# Patient Record
Sex: Male | Born: 1955
Health system: Southern US, Community
[De-identification: ages and names within clinical notes are randomized; demographics above are authoritative.]

## PROBLEM LIST (undated history)

## (undated) DIAGNOSIS — G4730 Sleep apnea, unspecified: Secondary | ICD-10-CM

## (undated) DIAGNOSIS — A6920 Lyme disease, unspecified: Secondary | ICD-10-CM

## (undated) DIAGNOSIS — I219 Acute myocardial infarction, unspecified: Secondary | ICD-10-CM

## (undated) DIAGNOSIS — I10 Essential (primary) hypertension: Secondary | ICD-10-CM

## (undated) DIAGNOSIS — N189 Chronic kidney disease, unspecified: Secondary | ICD-10-CM

## (undated) HISTORY — PX: ABLATION: SHX5711

---

## 2018-05-25 DIAGNOSIS — R059 Cough, unspecified: Secondary | ICD-10-CM | POA: Insufficient documentation

## 2018-08-12 DIAGNOSIS — D649 Anemia, unspecified: Secondary | ICD-10-CM | POA: Insufficient documentation

## 2019-03-18 DIAGNOSIS — E669 Obesity, unspecified: Secondary | ICD-10-CM | POA: Insufficient documentation

## 2019-05-23 DIAGNOSIS — C61 Malignant neoplasm of prostate: Secondary | ICD-10-CM | POA: Insufficient documentation

## 2019-05-25 DIAGNOSIS — N393 Stress incontinence (female) (male): Secondary | ICD-10-CM | POA: Insufficient documentation

## 2019-09-28 ENCOUNTER — Encounter (HOSPITAL_COMMUNITY): Payer: Self-pay | Admitting: Emergency Medicine

## 2019-09-28 ENCOUNTER — Other Ambulatory Visit: Payer: Self-pay

## 2019-09-28 ENCOUNTER — Inpatient Hospital Stay (HOSPITAL_COMMUNITY)
Admission: AD | Admit: 2019-09-28 | Discharge: 2019-10-01 | DRG: 286 | Disposition: A | Discharge status: Home / Self Care | Payer: Medicare HMO | Attending: Family Medicine | Admitting: Family Medicine

## 2019-09-28 ENCOUNTER — Emergency Department (HOSPITAL_COMMUNITY): Payer: Medicare HMO

## 2019-09-28 DIAGNOSIS — I428 Other cardiomyopathies: Secondary | ICD-10-CM | POA: Diagnosis present

## 2019-09-28 DIAGNOSIS — Z8546 Personal history of malignant neoplasm of prostate: Secondary | ICD-10-CM

## 2019-09-28 DIAGNOSIS — R0789 Other chest pain: Secondary | ICD-10-CM

## 2019-09-28 DIAGNOSIS — G4733 Obstructive sleep apnea (adult) (pediatric): Secondary | ICD-10-CM | POA: Diagnosis present

## 2019-09-28 DIAGNOSIS — Z8619 Personal history of other infectious and parasitic diseases: Secondary | ICD-10-CM

## 2019-09-28 DIAGNOSIS — I4892 Unspecified atrial flutter: Secondary | ICD-10-CM | POA: Diagnosis not present

## 2019-09-28 DIAGNOSIS — I483 Typical atrial flutter: Secondary | ICD-10-CM

## 2019-09-28 DIAGNOSIS — I77810 Thoracic aortic ectasia: Secondary | ICD-10-CM | POA: Diagnosis present

## 2019-09-28 DIAGNOSIS — R7303 Prediabetes: Secondary | ICD-10-CM | POA: Diagnosis present

## 2019-09-28 DIAGNOSIS — Z20822 Contact with and (suspected) exposure to covid-19: Secondary | ICD-10-CM | POA: Diagnosis present

## 2019-09-28 DIAGNOSIS — I440 Atrioventricular block, first degree: Secondary | ICD-10-CM | POA: Diagnosis present

## 2019-09-28 DIAGNOSIS — E876 Hypokalemia: Secondary | ICD-10-CM | POA: Diagnosis present

## 2019-09-28 DIAGNOSIS — I4891 Unspecified atrial fibrillation: Secondary | ICD-10-CM | POA: Diagnosis present

## 2019-09-28 DIAGNOSIS — I252 Old myocardial infarction: Secondary | ICD-10-CM

## 2019-09-28 DIAGNOSIS — I130 Hypertensive heart and chronic kidney disease with heart failure and stage 1 through stage 4 chronic kidney disease, or unspecified chronic kidney disease: Secondary | ICD-10-CM | POA: Diagnosis present

## 2019-09-28 DIAGNOSIS — I5023 Acute on chronic systolic (congestive) heart failure: Secondary | ICD-10-CM

## 2019-09-28 DIAGNOSIS — I5043 Acute on chronic combined systolic (congestive) and diastolic (congestive) heart failure: Secondary | ICD-10-CM | POA: Diagnosis present

## 2019-09-28 DIAGNOSIS — N1831 Chronic kidney disease, stage 3a: Secondary | ICD-10-CM | POA: Diagnosis present

## 2019-09-28 DIAGNOSIS — I2510 Atherosclerotic heart disease of native coronary artery without angina pectoris: Secondary | ICD-10-CM | POA: Diagnosis present

## 2019-09-28 DIAGNOSIS — E785 Hyperlipidemia, unspecified: Secondary | ICD-10-CM | POA: Diagnosis present

## 2019-09-28 DIAGNOSIS — I739 Peripheral vascular disease, unspecified: Secondary | ICD-10-CM | POA: Diagnosis present

## 2019-09-28 DIAGNOSIS — I272 Pulmonary hypertension, unspecified: Secondary | ICD-10-CM | POA: Diagnosis present

## 2019-09-28 DIAGNOSIS — I509 Heart failure, unspecified: Secondary | ICD-10-CM

## 2019-09-28 DIAGNOSIS — Z79899 Other long term (current) drug therapy: Secondary | ICD-10-CM

## 2019-09-28 DIAGNOSIS — I493 Ventricular premature depolarization: Secondary | ICD-10-CM | POA: Diagnosis present

## 2019-09-28 DIAGNOSIS — E669 Obesity, unspecified: Secondary | ICD-10-CM | POA: Diagnosis present

## 2019-09-28 DIAGNOSIS — Z6838 Body mass index (BMI) 38.0-38.9, adult: Secondary | ICD-10-CM

## 2019-09-28 DIAGNOSIS — Z87891 Personal history of nicotine dependence: Secondary | ICD-10-CM

## 2019-09-28 HISTORY — DX: Sleep apnea, unspecified: G47.30

## 2019-09-28 HISTORY — DX: Acute myocardial infarction, unspecified: I21.9

## 2019-09-28 HISTORY — DX: Essential (primary) hypertension: I10

## 2019-09-28 HISTORY — DX: Lyme disease, unspecified: A69.20

## 2019-09-28 LAB — CBC
HCT: 37.3 % — ABNORMAL LOW (ref 39.0–52.0)
Hemoglobin: 12.9 g/dL — ABNORMAL LOW (ref 13.0–17.0)
MCH: 29.1 pg (ref 26.0–34.0)
MCHC: 34.6 g/dL (ref 30.0–36.0)
MCV: 84.2 fL (ref 80.0–100.0)
Platelets: 319 10*3/uL (ref 150–400)
RBC: 4.43 MIL/uL (ref 4.22–5.81)
RDW: 14.4 % (ref 11.5–15.5)
WBC: 11.8 10*3/uL — ABNORMAL HIGH (ref 4.0–10.5)
nRBC: 0 % (ref 0.0–0.2)

## 2019-09-28 LAB — BASIC METABOLIC PANEL
Anion gap: 9 (ref 5–15)
BUN: 20 mg/dL (ref 8–23)
CO2: 26 mmol/L (ref 22–32)
Calcium: 9.1 mg/dL (ref 8.9–10.3)
Chloride: 106 mmol/L (ref 98–111)
Creatinine, Ser: 1.58 mg/dL — ABNORMAL HIGH (ref 0.61–1.24)
GFR calc Af Amer: 52 mL/min — ABNORMAL LOW (ref >60)
GFR calc non Af Amer: 45 mL/min — ABNORMAL LOW (ref >60)
Glucose, Bld: 114 mg/dL — ABNORMAL HIGH (ref 70–99)
Potassium: 3.3 mmol/L — ABNORMAL LOW (ref 3.5–5.1)
Sodium: 141 mmol/L (ref 135–145)

## 2019-09-28 LAB — BRAIN NATRIURETIC PEPTIDE: B Natriuretic Peptide: 343.3 pg/mL — ABNORMAL HIGH (ref 0.0–100.0)

## 2019-09-28 LAB — D-DIMER, QUANTITATIVE: D-Dimer, Quant: 0.61 ug/mL-FEU — ABNORMAL HIGH (ref 0.00–0.50)

## 2019-09-28 LAB — SARS CORONAVIRUS 2 BY RT PCR (HOSPITAL ORDER, PERFORMED IN ~~LOC~~ HOSPITAL LAB): SARS Coronavirus 2: NEGATIVE

## 2019-09-28 LAB — TROPONIN I (HIGH SENSITIVITY)
Troponin I (High Sensitivity): 40 ng/L — ABNORMAL HIGH (ref <18)
Troponin I (High Sensitivity): 32 ng/L — ABNORMAL HIGH (ref <18)

## 2019-09-28 MED ORDER — DILTIAZEM HCL 100 MG IV SOLR
5.0000 mg/h | INTRAVENOUS | Status: DC
  Filled 2019-09-28: qty 100

## 2019-09-28 MED ORDER — METOPROLOL TARTRATE 50 MG PO TABS
50.0000 mg | ORAL_TABLET | Freq: Every day | ORAL | Status: DC
  Administered 2019-09-28: 50 mg via ORAL
  Filled 2019-09-28: qty 2

## 2019-09-28 MED ORDER — DILTIAZEM HCL-DEXTROSE 125-5 MG/125ML-% IV SOLN (PREMIX)
5.0000 mg/h | INTRAVENOUS | Status: DC
  Administered 2019-09-28: 5 mg/h via INTRAVENOUS
  Filled 2019-09-28: qty 125

## 2019-09-28 MED ORDER — FUROSEMIDE 10 MG/ML IJ SOLN
20.0000 mg | Freq: Once | INTRAMUSCULAR | Status: AC
  Administered 2019-09-28: 20 mg via INTRAVENOUS
  Filled 2019-09-28: qty 2

## 2019-09-28 MED ORDER — HEPARIN BOLUS VIA INFUSION
5900.0000 [IU] | Freq: Once | INTRAVENOUS | Status: AC
  Administered 2019-09-28: 5900 [IU] via INTRAVENOUS
  Filled 2019-09-28: qty 5900

## 2019-09-28 MED ORDER — ONDANSETRON HCL 4 MG/2ML IJ SOLN: 4.0000 mg | Freq: Four times a day (QID) | INTRAMUSCULAR | Status: DC | PRN

## 2019-09-28 MED ORDER — SODIUM CHLORIDE 0.9% FLUSH: 3.0000 mL | Freq: Once | INTRAVENOUS | Status: DC

## 2019-09-28 MED ORDER — IRBESARTAN 150 MG PO TABS
300.0000 mg | ORAL_TABLET | Freq: Every day | ORAL | Status: DC
  Administered 2019-09-29 – 2019-10-01 (×3): 300 mg via ORAL
  Filled 2019-09-28 (×3): qty 2

## 2019-09-28 MED ORDER — HEPARIN (PORCINE) 25000 UT/250ML-% IV SOLN
1500.0000 [IU]/h | INTRAVENOUS | Status: DC
  Administered 2019-09-28: 1500 [IU]/h via INTRAVENOUS
  Administered 2019-09-29: 1700 [IU]/h via INTRAVENOUS
  Administered 2019-09-29: 1650 [IU]/h via INTRAVENOUS
  Filled 2019-09-28 (×3): qty 250

## 2019-09-28 MED ORDER — POTASSIUM CHLORIDE CRYS ER 20 MEQ PO TBCR
40.0000 meq | EXTENDED_RELEASE_TABLET | Freq: Once | ORAL | Status: AC
  Administered 2019-09-29: 40 meq via ORAL
  Filled 2019-09-28: qty 2

## 2019-09-28 MED ORDER — ATORVASTATIN CALCIUM 40 MG PO TABS
40.0000 mg | ORAL_TABLET | Freq: Every day | ORAL | Status: DC
  Administered 2019-09-29: 40 mg via ORAL
  Filled 2019-09-28: qty 1

## 2019-09-28 MED ORDER — ACETAMINOPHEN 325 MG PO TABS: 650.0000 mg | ORAL_TABLET | ORAL | Status: DC | PRN

## 2019-09-28 NOTE — ED Triage Notes (Signed)
Pt. Stated, Im having SOB and Chest pain or I should say chest tightness.Logan Robertson had several heart attacks.

## 2019-09-28 NOTE — ED Notes (Signed)
IV access attempted X2 without success. 

## 2019-09-28 NOTE — ED Provider Notes (Signed)
[facility] Provider Note   CSN: [id number] Arrival date & time: 09/28/19  1012     History Chief Complaint  Patient presents with  . Shortness of Breath  . Chest Pain    Logan Robertson is a 64 y.o. male.  HPI     Patient presents concern of dyspnea, fatigue.  Patient also has chest pain, tightness.  Patient has multiple medical issues, which he acknowledges.  He has previously received medical care in [place], has no team of physicians here. He has a history of cardiomyopathy, MI.  It is unclear if he has been taking his medication regularly. Now, over the past 2 days he has developed chest pain, dyspnea.  Pain described as tightness, not clearly better or worse with anything including exertion, positioning. No fever, no cough.  History reviewed. No pertinent past medical history.  There are no problems to display for this patient.   History reviewed. No pertinent surgical history.     No family history on file.  Social History   Tobacco Use  . Smoking status: Former Research scientist (life sciences)  . Smokeless tobacco: Never Used  Substance Use Topics  . Alcohol use: Not Currently  . Drug use: Not Currently    Home Medications Prior to Admission medications   Medication Sig Start Date End Date Taking? Authorizing Provider  acetaminophen (TYLENOL) 325 MG tablet Take 650 mg by mouth every 6 (six) hours as needed for moderate pain.   Yes [provider]  amLODipine (NORVASC) 10 MG tablet Take 10 mg by mouth 2 (two) times daily. 07/21/19 07/20/20 Yes [provider]  atorvastatin (LIPITOR) 40 MG tablet Take 40 mg by mouth daily. 08/26/18  Yes [provider]  chlorthalidone (HYGROTON) 25 MG tablet Take 25 mg by mouth 2 (two) times daily. 08/26/18  Yes [provider]  ibuprofen (ADVIL) 200 MG tablet Take 600 mg by mouth every 6 (six) hours as needed for moderate pain.   Yes [provider]  metoprolol  tartrate (LOPRESSOR) 50 MG tablet Take 50 mg by mouth daily. 08/26/18  Yes [provider]  valsartan (DIOVAN) 320 MG tablet Take 320 mg by mouth daily. 05/14/19  Yes [provider]    Allergies    Lisinopril  Review of Systems   Review of Systems  Constitutional:       Per HPI, otherwise negative  HENT:       Per HPI, otherwise negative  Respiratory:       Per HPI, otherwise negative  Cardiovascular:       Per HPI, otherwise negative  Gastrointestinal: Negative for vomiting.  Endocrine:       Negative aside from HPI  Genitourinary:       Neg aside from HPI   Musculoskeletal:       Per HPI, otherwise negative  Skin: Negative.   Neurological: Negative for syncope.    Physical Exam Updated Vital Signs BP (!) 143/98   Pulse 67   Temp 97.8 F (36.6 C) (Oral)   Resp 16   Ht 6' (1.829 m)   Wt 127 kg   SpO2 99%   BMI 37.97 kg/m   Physical Exam Vitals and nursing note reviewed.  Constitutional:      General: He is not in acute distress.    Appearance: He is obese.  HENT:     Head: Normocephalic and atraumatic.  Eyes:     Conjunctiva/sclera: Conjunctivae normal.  Cardiovascular:  Rate and Rhythm: Normal rate and regular rhythm.  Pulmonary:     Effort: Pulmonary effort is normal. No respiratory distress.     Breath sounds: No stridor. Decreased breath sounds present.  Abdominal:     General: There is no distension.  Skin:    General: Skin is warm and dry.  Neurological:     Mental Status: He is alert and oriented to person, place, and time.      ED Results / Procedures / Treatments   Labs (all labs ordered are listed, but only abnormal results are displayed) Labs Reviewed  BASIC METABOLIC PANEL - Abnormal; Notable for the following components:      Result Value   Potassium 3.3 (*)    Glucose, Bld 114 (*)    Creatinine, Ser 1.58 (*)    GFR calc non Af Amer 45 (*)    GFR calc Af Amer 52 (*)    All other components within normal  limits  CBC - Abnormal; Notable for the following components:   WBC 11.8 (*)    Hemoglobin 12.9 (*)    HCT 37.3 (*)    All other components within normal limits  BRAIN NATRIURETIC PEPTIDE - Abnormal; Notable for the following components:   B Natriuretic Peptide 343.3 (*)    All other components within normal limits  TROPONIN I (HIGH SENSITIVITY) - Abnormal; Notable for the following components:   Troponin I (High Sensitivity) 40 (*)    All other components within normal limits  TROPONIN I (HIGH SENSITIVITY) - Abnormal; Notable for the following components:   Troponin I (High Sensitivity) 32 (*)    All other components within normal limits  D-DIMER, QUANTITATIVE (NOT AT [facility])    EKG EKG Interpretation  Date/Time:  Tuesday September 28 2019 10:12:36 EDT Ventricular Rate:  83 PR Interval:    QRS Duration: 92 QT Interval:  432 QTC Calculation: 507 R Axis:   -3 Text Interpretation: THIS IS AN AMENDED REPORT, PLEASE DISCARD PREVIOUS RECORD Sinus rhythm w periods sof either ectopy or afib Premature ventricular complexes Minimal voltage criteria for LVH, may be normal variant ( R in aVL ) ST-t wave abnormality Artifact Abnormal ECG Reconfirmed by Carmin Muskrat [phone]) on 09/28/2019 1:05:53 PM   Radiology DG Chest 2 View  Result Date: 09/28/2019 CLINICAL DATA:  Chest pain, shortness of breath EXAM: CHEST - 2 VIEW COMPARISON:  None. FINDINGS: Heart size is mildly enlarged. Mildly increased bibasilar interstitial markings. No pleural effusion or pneumothorax. No acute osseous findings. IMPRESSION: 1. Mildly increased bibasilar interstitial markings, which may reflect mild edema versus atypical/viral infection. 2. Mild cardiomegaly. Electronically Signed   By: Davina Poke D.O.   On: 09/28/2019 10:46    Procedures Procedures (including critical care time)  Medications Ordered in ED Medications  sodium chloride flush (NS) 0.9 % injection 3 mL (has no administration in time range)     ED Course  I have reviewed the triage vital signs and the nursing notes.  Pertinent labs & imaging results that were available during my care of the patient were reviewed by me and considered in my medical decision making (see chart for details).     Patient with multiple medical issues including cardiomyopathy presents with chest pain, dyspnea.  Here he is awake, alert, without oxygen requirement, but with tachypnea, concerning for uncontrolled hypertension versus worsening heart failure versus PE versus ACS.  Labs, x-ray, EKG all ordered after initial evaluation.  3:38 PM Patient has been seen and evaluated  by our home health team to facilitate ongoing care after discharge.  Initial findings notable for elevated BNP, greater than 300, x-ray suggesting pulmonary edema.  Patient will receive IV Lasix.  Patient's troponin results otherwise reassuring, EKG is not overtly ischemic.  Patient has D-dimer pending, given his history, should this be normal, patient will be appropriate for discharge with close outpatient follow-up with possible at home program.  MDM Number of Diagnoses or Management Options Acute on chronic congestive heart failure, unspecified heart failure type [facility]): new, needed workup Atypical chest pain: new, needed workup   Amount and/or Complexity of Data Reviewed Clinical lab tests: reviewed Tests in the radiology section of CPT: reviewed Tests in the medicine section of CPT: reviewed Decide to obtain previous medical records or to obtain history from someone other than the patient: yes Review and summarize past medical records: yes Discuss the patient with other providers: yes Independent visualization of images, tracings, or specimens: yes  Risk of Complications, Morbidity, and/or Mortality Presenting problems: high Diagnostic procedures: high Management options: high  Critical Care Total time providing critical care: < 30 minutes  Patient  Progress Patient progress: stable   Final Clinical Impression(s) / ED Diagnoses Final diagnoses:  Acute on chronic congestive heart failure, unspecified heart failure type ([place])  Atypical chest pain     Carmin Muskrat, MD 09/28/19 1540

## 2019-09-28 NOTE — H&P (Addendum)
[facility] Admission History and Physical Service Pager: [phone]  Patient name: Logan Robertson Medical record number: [id number] Date of birth: Aug 02, 1955 Age: 64 y.o. Gender: male  Primary Care Provider: No primary care provider on file. Consultants: Cards Code Status: Full Code Preferred Emergency Contact: Lubertha Basque (friend) [phone]  Chief Complaint: Shortness of breath, new onset arrhythmia  Assessment and Plan:  Logan Robertson is a 64 y.o. male presenting with dyspnea, fatigue, chest tightness, found to have new onset arrhythmia.  Additionally, patient is new to the area, recently moved here from [place]. PMH is significant for cardiomyopathy, adenocarcinoma of prostate, hypertension, hyperlipidemia, Lyme disease, MI (2008?), angioedema due to ACE-I, BPH, CKD, elevated PSA, CVA (2010), thyrotoxicosis?, OSA.  Chest pain/tightness likely 2/2 new onset A-flutter +/- mild CHF exacerbation  Patient presenting to the emergency department today with 2-day history of chest tightness and SOB. Patient reported that tightness initially started while lifting heavy objects but resolved with rest.  He reported that it would help if he took slow deep breaths.  Patient also reported intermittent pain in his right calf that felt like a cramp but resolved with rest.  Denies any tenderness to palpation.  He was concerned today because it was happening every 30 minutes while at rest and would last for 2 to 3 minutes in occurrence.  Patient has a history of possible heart attack (2008) although no history of catheterization or stenting and normal stress test in 2018. Reassuring that troponins on admission 40, downtrending to 32. EKG showed new atrial flutter with predominant 4-1 AV block.  Chest x-ray showed mildly increased bibasilar interstitial markings, may reflect mild edema versus atypical viral infection. Less likely infection given lack of infectious symptoms.  Some  concern for mild CHF exacerbation given mild cardiomegaly on CXR, BNP elevated at 343. Most recent echo in 2018 with resolved non-ischemic cardiomyopathy with normal EF. D-dimer on admission mildly elevated at 0.61, however symmetric LE's and Well's score 1.5 due to tachycardia. At this time, differential for symptoms includes intermittent atrial flutter and mild CHF exacerbation. Less likely ACS given flat high-sensitivity troponins and EKG negative for STEMI.  This chest pain/tightness is most likely related to patient's new onset atrial flutter.  -Admit to inpatient with Dr. Gwendlyn Deutscher is the attending -Cardiology consulted, appreciate recommendations -Continue heparin infusion -Continuous cardiac monitoring -Echocardiogram ordered -Continue heparin per pharmacy -Continue diltiazem -PT/INR, APTT -TSH, Lipid panel, Hemoglobin A1c -Morning BMP and CBC -Strict I's and O's -Vital signs per routine -PT/OT eval and treat -Continuous pulse ox -Oxygen therapy keeping goal over 92% -Lower extremity ABIs  New onset atrial flutter:  Patient started on Cardizem in the emergency department.  Currently prescribed metoprolol 50 mg daily outpatient.  EKG on arrival showed atrial flutter with predominant 4-1 AV block.  On evaluation patient's heart rate was between 80 and 130.  He was intermittently going between atrial flutter and normal sinus rhythm. -consult cardiology in AM -Continue diltiazem drip, transition to PO when able -Holding home metoprolol at this time -See above for rest of plan  LE Claudication: Patient admitted to intermittent claudication in RLE that would occur with exertion and resolve with rest. Described pain as "cramp".  - obtain ABI's to evaluate  Hypertension:  Blood pressure since arrival 143/98-26/119.  On evaluation blood pressure was elevated in the 160s over 90s..  Patient currently prescribed amlodipine 10, valsartan 320 mg daily, chlorthalidone, and metoprolol 50 mg  daily. -Irbesartan 300 mg daily (valsartan is  not formulary here) -Continue Cardizem drip -Holding amlodipine, metoprolol, chlorthalidone  Hypokalemia  Patient noted to have hypokalemia at 3.3 on admission. -40 mEq Klor-Con given -Morning BMPs -Replete as necessary  Kidney disease 3a: Patient with creatinine 1.58 on admission, GFR is 45 on admission.  It is unknown if patient has previous kidney disease.  7 months ago patient's GFR was 63. -Morning BMPs -Avoid nephrotoxic agents  Hyperlipidemia:  Patient currently prescribed atorvastatin 40 mg. -Continue home atorvastatin  H/o Non-ischemic Cardiomyopathy  History of CAD?  History of CVA?:  Per cahrt review patient has history of "two strokes and one heart attack". Per patient, MI was in 2008 . Per cardiology notes from [place], he has h/o non-ischemic cardiomyopathy 2/2 to uncontrolled hypertension that appears to have since resolved.  He has NST in 2013? That showed EF of 30% with inferior scare; Echo with EF 40-45% with inferior hypokinesis. In 2018, he had repeat testing that revealed normalization of his EF. NST at that time showed no ischemia with an EF of 58%. Does not appear to have any history of cardiac catheterization or stenting. Home meds: ASA 81mg  QD, Atorvastatin, Valsartan. -Continue atorvastatin and start -Cardiology consulted, appreciate recommendations -Holding home aspirin at this time -Repeat echo ordered  History of Lymes Disease: Per chart review, patient was diagnosed with Lymes Disease in 2020 with positive IgG and IgM labs. He is s/p 4 month course of Doxycycline. Joint pains have improved. Per rheumatology note from 11/26/2018, it is not recommended to treat with repeat rounds of antibiotics for arthralgias related to Lymes.  treat with Doxycycline. Patient denies any current arthralgias. - PRN anti-inflammatory medications for joint pains if needed  History of PSA Discordant Intermediate risk  Prostate Cancer: Stable He was being worked up by urologist in [place], last appointment in May 2021. He has had multiple imaging (CT, Bone scan, MRI) that was negative for metastasis. Appears patient had a non-NS RALP (prostatectomy) in 02/2019 with an undetectable PSA in 04/2019.   OSA:  Sleep study performed in [place]. He was supposed to trial CPAP and follow up.  Reported that the CPAP helped tremendously. Patient notes he wears CPAP nightly. - CPAP qHS  FEN/GI: Heart healthy Prophylaxis: Heparin drip  Disposition: Admit to med telemetry  History of Present Illness:  Logan Robertson is a 64 y.o. male presenting with two day history of chest tightness and dyspnea on exertion. Patient moved from [place] 4 days ago. He notes that he was moving boxes yesterday when he developed chest tightness and SOB. He notes the chest tightness was located in center of chest and felt like "someone grabbing the heart and not letting it beat". He notes he's had this before over the years. He notes the chest pain resolved with rest. Denies any palpations, diaphoresis, jaw pain, or arm pain. Patient notes that he was lying around he would get chest tightness and SOB that would last 2-3 minutes and self-resolve with deep breaths. He notes it only would happen when he was laying down. Changing positions to allow breathing would help.  Denies any LE swelling. He notes he will get calf pain with exertion that improves with rest. It does not occur at rest. Patient denies any orthopnea or PND. He has history of CPAP and was using it regularly when in [place] but "gave it back when he moved because he wasn't sure he could bring it with you". Denies any cough, congestion, fevers, chills, nausea, vomiting,  diarrhea, constipation. Patient notes recently moved from [place] to [place], [place] which is 6 hour drive. No other recent travels. No sick contacts. No recent surgeries.  No alcohol. No tobacco history. No illicit  drugs.  Review Of Systems: Per HPI with the following additions:  Review of Systems  Constitutional: Negative for chills, diaphoresis and fever.  HENT: Positive for postnasal drip. Negative for congestion, rhinorrhea and sore throat.   Eyes: Negative for visual disturbance.  Respiratory: Positive for chest tightness and shortness of breath. Negative for cough and wheezing.   Cardiovascular: Positive for chest pain. Negative for palpitations and leg swelling.  Gastrointestinal: Negative for abdominal pain, constipation, diarrhea, nausea and vomiting.  Endocrine: Negative for polyuria.  Genitourinary: Negative for dysuria and hematuria.       History of known incontinence   Musculoskeletal: Negative for arthralgias and joint swelling.  Neurological: Negative for weakness.     Patient Active Problem List   Diagnosis Date Noted  . Atrial flutter ([place]) 09/28/2019    Past Medical History: History reviewed. No pertinent past medical history. Prostatic adenocarcinoma (02/15/2019)   Past Surgical History: History reviewed. No pertinent surgical history.  Social History: Social History   Tobacco Use  . Smoking status: Former Research scientist (life sciences)  . Smokeless tobacco: Never Used  Substance Use Topics  . Alcohol use: Not Currently  . Drug use: Not Currently   Additional social history: Please also refer to relevant sections of EMR.  Family History: No family history on file.  Allergies and Medications: Allergies  Allergen Reactions  . Lisinopril     Lip swelled up   No current facility-administered medications on file prior to encounter.   Current Outpatient Medications on File Prior to Encounter  Medication Sig Dispense Refill  . acetaminophen (TYLENOL) 325 MG tablet Take 650 mg by mouth every 6 (six) hours as needed for moderate pain.    Marland Kitchen amLODipine (NORVASC) 10 MG tablet Take 10 mg by mouth 2 (two) times daily.    Marland Kitchen atorvastatin (LIPITOR) 40 MG tablet Take 40 mg by mouth daily.     . chlorthalidone (HYGROTON) 25 MG tablet Take 25 mg by mouth 2 (two) times daily.    Marland Kitchen ibuprofen (ADVIL) 200 MG tablet Take 600 mg by mouth every 6 (six) hours as needed for moderate pain.    . metoprolol tartrate (LOPRESSOR) 50 MG tablet Take 50 mg by mouth daily.    . valsartan (DIOVAN) 320 MG tablet Take 320 mg by mouth daily.      Objective: BP (!) 163/89 (BP Location: Right Arm)   Pulse 87   Temp 98.6 F (37 C) (Oral)   Resp (!) 22   Ht 6' (1.829 m)   Wt 126.8 kg   SpO2 99%   BMI 37.91 kg/m  Physical Exam Vitals reviewed.  Constitutional:      Appearance: He is well-developed.  HENT:     Head: Normocephalic and atraumatic.     Mouth/Throat:     Mouth: Mucous membranes are moist.     Pharynx: Oropharynx is clear.  Eyes:     Extraocular Movements: Extraocular movements intact.     Pupils: Pupils are equal, round, and reactive to light.  Cardiovascular:     Rate and Rhythm: Tachycardia present. Rhythm irregular.     Pulses: Normal pulses.     Heart sounds: No murmur heard.   Pulmonary:     Effort: Pulmonary effort is normal. No tachypnea or respiratory distress.  Breath sounds: Normal breath sounds.  Chest:     Chest wall: No mass, tenderness or edema.  Abdominal:     General: Bowel sounds are normal.     Palpations: Abdomen is soft.  Musculoskeletal:     Cervical back: Normal range of motion and neck supple.     Comments: Trace edema in lower extremities bilaterally  Lymphadenopathy:     Cervical: No cervical adenopathy.  Skin:    General: Skin is warm and dry.     Findings: No ecchymosis or rash.  Neurological:     General: No focal deficit present.     Mental Status: He is alert.  Psychiatric:        Mood and Affect: Mood normal.        Behavior: Behavior normal.      Labs and Imaging: CBC BMET  Recent Labs  Lab 09/28/19 1032  WBC 11.8*  HGB 12.9*  HCT 37.3*  PLT 319   Recent Labs  Lab 09/28/19 1032  NA 141  K 3.3*  CL 106  CO2 26   BUN 20  CREATININE 1.58*  GLUCOSE 114*  CALCIUM 9.1     EKG: Atrial flutter with predominant 4-1 AV block  DG Chest 2 View  Result Date: 09/28/2019 CLINICAL DATA:  Chest pain, shortness of breath EXAM: CHEST - 2 VIEW COMPARISON:  None. FINDINGS: Heart size is mildly enlarged. Mildly increased bibasilar interstitial markings. No pleural effusion or pneumothorax. No acute osseous findings. IMPRESSION: 1. Mildly increased bibasilar interstitial markings, which may reflect mild edema versus atypical/viral infection. 2. Mild cardiomegaly. Electronically Signed   By: Davina Poke D.O.   On: 09/28/2019 10:46     Gifford Shave, MD 09/28/2019, 10:57 PM PGY-1, [facility] pager: [phone], text pages welcome  Upper Level Addendum: I have seen and evaluated this patient along with Dr. Caron Presume and reviewed the above note, making necessary revisions in green.   Mina Marble, D.O. 09/29/2019, 7:24 AM PGY-2, [place]

## 2019-09-28 NOTE — Plan of Care (Signed)
[facility]  Consult Note  Evaluated Mr. Deer for possible hospital at home admission.  Pt with PMH of reports worsening dyspnea on exertion and chest tightness.  Cardiac History not completely clear, cannot see notes in care everywhere.  Called pts cardiologist office and according to NP there pt with HFrEF in 2013 and EF improved to normal in 2018 with GDMT.  He is mildly volume overloaded on exam.  No signs of acute ischemia on initial workup here in ED.  I performed a limited ECHO on the patient at which time I noticed he now again has reduced ejection fraction. His IVC was dilated but collapsible, I observed no significant valvular abnormalities but this part of the exam was limited due to body habitus.  When I asked him to move for a better view his heart rate increased dramatically with possible flutter waves on telemetry.  At this point I was concerned for arryhthmia and asked the ED provider to repeat an ECG which confirmed atrial flutter which appears to be a new diagnosis for the patient.  Discussed with the ED provider and the decision was to admit the patient which was appropriate.  Appreciate the consultation and allowing me to be a part of this patient's care.    Katherine Roan, MD 09/28/2019, 3:17 PM

## 2019-09-28 NOTE — ED Provider Notes (Signed)
Patient care assumed at 1600. Patient with history of CHF here for evaluation of increased shortness of breath. He moved to the [place] area four days ago. He does not yet have a local physician. D dimer is pending at time of signout.  Awaiting D dimer results patient did develop activity on the monitor. New EKG demonstrates atrial flutter. His rates are varying between 70 and 133 bpm. Patient has no history of atrial flutter. It is unclear how long he has been in this rhythm. He is not anticoagulated. Plan to start on diltiazem for rate control, heparin drip. Discussed with Dr. Shan Levans with hospital at home program. Given these new findings feel patient is not a good hospital at home candidate, will admit for further evaluation and treatment. Discussed with patient findings of studies and he is in agreement with treatment plan. Medicine consulted for admission.   Quintella Reichert, MD 09/28/19 [phone]

## 2019-09-28 NOTE — Progress Notes (Signed)
ANTICOAGULATION CONSULT NOTE - Initial Consult  Pharmacy Consult for heparin Indication: atrial fibrillation  Allergies  Allergen Reactions  . Lisinopril     Lip swelled up    Patient Measurements: Height: 6' (182.9 cm) Weight: 127 kg (280 lb) IBW/kg (Calculated) : 77.6 Heparin Dosing Weight: 106 kg   Vital Signs: Temp: 97.8 F (36.6 C) (06/15 1022) Temp Source: Oral (06/15 1022) BP: 143/98 (06/15 1022) Pulse Rate: 67 (06/15 1022)  Labs: Recent Labs    09/28/19 1032 09/28/19 1255  HGB 12.9*  --   HCT 37.3*  --   PLT 319  --   CREATININE 1.58*  --   TROPONINIHS 40* 32*    Estimated Creatinine Clearance: 63.4 mL/min (A) (by C-G formula based on SCr of 1.58 mg/dL (H)).   Medical History: History reviewed. No pertinent past medical history.  Medications:  Scheduled:  . furosemide  20 mg Intravenous Once  . sodium chloride flush  3 mL Intravenous Once    Assessment: 85 yom presenting with CP and found to have atrial fibrillation on EKG, no anticoagulation noted PTA. CHADSVASc at least 2 given known history. Baseline Hgb 12.9, platelets 319.   Goal of Therapy:  Heparin level 0.3-0.7 units/ml Monitor platelets by anticoagulation protocol: Yes   Plan:  Give 5900 units bolus x 1 Start heparin infusion at 1500 units/hr Check anti-Xa level in 6 hours and daily while on heparin Continue to monitor H&H and platelets F/u plans for oral anticoagulation    Thank you,   Eddie Candle, PharmD PGY-1 Pharmacy Resident   Please check amion for clinical pharmacist contact number 09/28/2019,5:37 PM

## 2019-09-29 ENCOUNTER — Observation Stay (HOSPITAL_COMMUNITY): Payer: Medicare HMO

## 2019-09-29 ENCOUNTER — Observation Stay (HOSPITAL_BASED_OUTPATIENT_CLINIC_OR_DEPARTMENT_OTHER): Payer: Medicare HMO

## 2019-09-29 ENCOUNTER — Encounter (HOSPITAL_COMMUNITY): Payer: Medicare HMO

## 2019-09-29 DIAGNOSIS — I4892 Unspecified atrial flutter: Principal | ICD-10-CM

## 2019-09-29 DIAGNOSIS — I5042 Chronic combined systolic (congestive) and diastolic (congestive) heart failure: Secondary | ICD-10-CM

## 2019-09-29 DIAGNOSIS — I5023 Acute on chronic systolic (congestive) heart failure: Secondary | ICD-10-CM | POA: Diagnosis not present

## 2019-09-29 DIAGNOSIS — R0789 Other chest pain: Secondary | ICD-10-CM | POA: Diagnosis not present

## 2019-09-29 DIAGNOSIS — I429 Cardiomyopathy, unspecified: Secondary | ICD-10-CM

## 2019-09-29 DIAGNOSIS — I340 Nonrheumatic mitral (valve) insufficiency: Secondary | ICD-10-CM | POA: Diagnosis not present

## 2019-09-29 DIAGNOSIS — I351 Nonrheumatic aortic (valve) insufficiency: Secondary | ICD-10-CM

## 2019-09-29 DIAGNOSIS — I483 Typical atrial flutter: Secondary | ICD-10-CM | POA: Diagnosis not present

## 2019-09-29 LAB — BASIC METABOLIC PANEL
Anion gap: 10 (ref 5–15)
BUN: 18 mg/dL (ref 8–23)
CO2: 24 mmol/L (ref 22–32)
Calcium: 9 mg/dL (ref 8.9–10.3)
Chloride: 107 mmol/L (ref 98–111)
Creatinine, Ser: 1.41 mg/dL — ABNORMAL HIGH (ref 0.61–1.24)
GFR calc Af Amer: 60 mL/min — ABNORMAL LOW (ref >60)
GFR calc non Af Amer: 52 mL/min — ABNORMAL LOW (ref >60)
Glucose, Bld: 99 mg/dL (ref 70–99)
Potassium: 3.2 mmol/L — ABNORMAL LOW (ref 3.5–5.1)
Sodium: 141 mmol/L (ref 135–145)

## 2019-09-29 LAB — RAPID URINE DRUG SCREEN, HOSP PERFORMED
Amphetamines: NOT DETECTED
Barbiturates: NOT DETECTED
Benzodiazepines: NOT DETECTED
Cocaine: NOT DETECTED
Opiates: NOT DETECTED
Tetrahydrocannabinol: NOT DETECTED

## 2019-09-29 LAB — LIPID PANEL
Cholesterol: 196 mg/dL (ref 0–200)
HDL: 42 mg/dL (ref >40)
LDL Cholesterol: 134 mg/dL — ABNORMAL HIGH (ref 0–99)
Total CHOL/HDL Ratio: 4.7 RATIO
Triglycerides: 98 mg/dL (ref <150)
VLDL: 20 mg/dL (ref 0–40)

## 2019-09-29 LAB — CBC
HCT: 36 % — ABNORMAL LOW (ref 39.0–52.0)
Hemoglobin: 12.8 g/dL — ABNORMAL LOW (ref 13.0–17.0)
MCH: 29.5 pg (ref 26.0–34.0)
MCHC: 35.6 g/dL (ref 30.0–36.0)
MCV: 82.9 fL (ref 80.0–100.0)
Platelets: 287 10*3/uL (ref 150–400)
RBC: 4.34 MIL/uL (ref 4.22–5.81)
RDW: 14.2 % (ref 11.5–15.5)
WBC: 10.7 10*3/uL — ABNORMAL HIGH (ref 4.0–10.5)
nRBC: 0 % (ref 0.0–0.2)

## 2019-09-29 LAB — APTT: aPTT: 88 seconds — ABNORMAL HIGH (ref 24–36)

## 2019-09-29 LAB — PROTIME-INR
INR: 1.1 (ref 0.8–1.2)
Prothrombin Time: 13.9 seconds (ref 11.4–15.2)

## 2019-09-29 LAB — MAGNESIUM: Magnesium: 1.7 mg/dL (ref 1.7–2.4)

## 2019-09-29 LAB — HIV ANTIBODY (ROUTINE TESTING W REFLEX): HIV Screen 4th Generation wRfx: NONREACTIVE

## 2019-09-29 LAB — ECHOCARDIOGRAM COMPLETE
Height: 72 in
Weight: 4488 oz

## 2019-09-29 LAB — HEPARIN LEVEL (UNFRACTIONATED)
Heparin Unfractionated: 0.37 IU/mL (ref 0.30–0.70)
Heparin Unfractionated: 0.66 IU/mL (ref 0.30–0.70)

## 2019-09-29 LAB — TSH: TSH: 1.512 u[IU]/mL (ref 0.350–4.500)

## 2019-09-29 MED ORDER — ASPIRIN 81 MG PO CHEW
81.0000 mg | CHEWABLE_TABLET | ORAL | Status: AC
  Administered 2019-09-30: 81 mg via ORAL
  Filled 2019-09-29: qty 1

## 2019-09-29 MED ORDER — POTASSIUM CHLORIDE CRYS ER 20 MEQ PO TBCR
40.0000 meq | EXTENDED_RELEASE_TABLET | Freq: Two times a day (BID) | ORAL | Status: AC
  Administered 2019-09-29 (×2): 40 meq via ORAL
  Filled 2019-09-29 (×2): qty 2

## 2019-09-29 MED ORDER — SODIUM CHLORIDE 0.9 % IV SOLN: INTRAVENOUS | Status: DC

## 2019-09-29 MED ORDER — SODIUM CHLORIDE 0.9% FLUSH: 3.0000 mL | INTRAVENOUS | Status: DC | PRN

## 2019-09-29 MED ORDER — OFF THE BEAT BOOK
Freq: Once | Status: AC
  Filled 2019-09-29: qty 1

## 2019-09-29 MED ORDER — ATORVASTATIN CALCIUM 80 MG PO TABS
80.0000 mg | ORAL_TABLET | Freq: Every day | ORAL | Status: DC
  Administered 2019-09-30 – 2019-10-01 (×2): 80 mg via ORAL
  Filled 2019-09-29 (×2): qty 1

## 2019-09-29 MED ORDER — SODIUM CHLORIDE 0.9 % IV SOLN: 250.0000 mL | INTRAVENOUS | Status: DC | PRN

## 2019-09-29 MED ORDER — METOPROLOL SUCCINATE ER 50 MG PO TB24
50.0000 mg | ORAL_TABLET | Freq: Every day | ORAL | Status: DC
  Administered 2019-09-29 – 2019-10-01 (×3): 50 mg via ORAL
  Filled 2019-09-29 (×3): qty 1

## 2019-09-29 MED ORDER — SODIUM CHLORIDE 0.9% FLUSH: 3.0000 mL | Freq: Two times a day (BID) | INTRAVENOUS | Status: DC

## 2019-09-29 NOTE — Progress Notes (Signed)
Patient refused CPAP for the night.  RT will continue to monitor. 

## 2019-09-29 NOTE — Progress Notes (Signed)
  Echocardiogram 2D Echocardiogram has been performed.  Logan Robertson 09/29/2019, 12:22 PM

## 2019-09-29 NOTE — Progress Notes (Signed)
[facility] for heparin Indication: atrial fibrillation   Assessment: 30 yom presenting with CP and found to have atrial fibrillation on EKG, no anticoagulation noted PTA. CHADSVASc at least 2 given known history. Baseline Hgb 12.9, platelets 319.  Initial heparin level 0.37 units/ml, drawn ~ 4.5 hours after start  Goal of Therapy:  Heparin level 0.3-0.7 units/ml Monitor platelets by anticoagulation protocol: Yes   Plan:  Increase heparin infusion to 1700 units/hr Check anti-Xa level in 6 hours and daily while on heparin Continue to monitor H&H and platelets  Thank you,  Excell Seltzer, PharmD Clinical Pharmacist

## 2019-09-29 NOTE — Evaluation (Signed)
Physical Therapy Evaluation Patient Details Name: Logan Robertson MRN: [id number] DOB: 1955-10-13 Today's Date: 09/29/2019   History of Present Illness  64 yo male with onset of SOB and chest tightness was admitted, had feeling it was from not using bipap 10 days.  MD notes new onset a-flutter and CHF, low K+.  PMHx:  OSA, CAD, lyme disease, CKD 3a, cardiomyopathy, CVA, MI  Clinical Impression  PT was seen for mobiltiy ck and noted in standing he was light headed.  Returned to sitting and monitored vitals, noting his sat in standing was 97%, pulse 123 and BP was 144/119.  He was not surprised and reported his numbers have been high.  Follow up to perform a longer mobility trial as well as stair training, and will expect a more independent presentation given his initial limit being about vitals and not actual abiltiy.  Follow acutely for these needs, transition home with HHPT mainly to assess his cardiac status in that setting.    Follow Up Recommendations Home health PT;Supervision for mobility/OOB    Equipment Recommendations  None recommended by PT    Recommendations for Other Services       Precautions / Restrictions Precautions Precautions: Fall Precaution Comments: monitor vitals with mobility esp BP Restrictions Weight Bearing Restrictions: No      Mobility  Bed Mobility Overal bed mobility: Needs Assistance Bed Mobility: Supine to Sit     Supine to sit: Supervision (for lines)        Transfers Overall transfer level: Needs assistance Equipment used: None Transfers: Sit to/from Stand Sit to Stand: Supervision         General transfer comment: supervision for lines and due to BP  Ambulation/Gait             General Gait Details: sidesteps at side of bed but deferred longer walk due to uncontrolled BP in standing  Stairs            Wheelchair Mobility    Modified Rankin (Stroke Patients Only)       Balance Overall balance assessment: Needs  assistance Sitting-balance support: Feet supported Sitting balance-[name] Scale: Good     Standing balance support: No upper extremity supported Standing balance-[name] Scale: Fair                               Pertinent Vitals/Pain Pain Assessment: No/denies pain    Home Living Family/patient expects to be discharged to:: Private residence Living Arrangements: Spouse/significant other Available Help at Discharge: Family;Available 24 hours/day Type of Home: House       Home Layout: Two level Home Equipment: None Additional Comments: was moving boxes when chest tightness occured    Prior Function Level of Independence: Independent               Hand Dominance   Dominant Hand: Right    Extremity/Trunk Assessment   Upper Extremity Assessment Upper Extremity Assessment: Overall WFL for tasks assessed    Lower Extremity Assessment Lower Extremity Assessment: Overall WFL for tasks assessed    Cervical / Trunk Assessment Cervical / Trunk Assessment: Normal  Communication   Communication: No difficulties  Cognition Arousal/Alertness: Awake/alert Behavior During Therapy: WFL for tasks assessed/performed Overall Cognitive Status: No family/caregiver present to determine baseline cognitive functioning  General Comments: has been sitting on wet bed and did not alert staff      General Comments General comments (skin integrity, edema, etc.): Pt has sitting BP of 157/91, standing with light headed feeling of 144/119 with pulse 123    Exercises     Assessment/Plan    PT Assessment Patient needs continued PT services  PT Problem List Decreased activity tolerance;Decreased mobility;Decreased safety awareness;Cardiopulmonary status limiting activity       PT Treatment Interventions DME instruction;Gait training;Stair training;Functional mobility training;Therapeutic activities;Therapeutic exercise;Balance  training;Neuromuscular re-education;Patient/family education    PT Goals (Current goals can be found in the Care Plan section)  Acute Rehab PT Goals Patient Stated Goal: to get going and feel better PT Goal Formulation: With patient Time For Goal Achievement: 10/06/19 Potential to Achieve Goals: Good    Frequency Min 3X/week   Barriers to discharge Inaccessible home environment has stairs at home but was moving boxes when he had symptoms    Co-evaluation               AM-PAC PT "6 Clicks" Mobility  Outcome Measure Help needed turning from your back to your side while in a flat bed without using bedrails?: None Help needed moving from lying on your back to sitting on the side of a flat bed without using bedrails?: None Help needed moving to and from a bed to a chair (including a wheelchair)?: A Little Help needed standing up from a chair using your arms (e.g., wheelchair or bedside chair)?: A Little Help needed to walk in hospital room?: A Little Help needed climbing 3-5 steps with a railing? : A Little 6 Click Score: 20    End of Session Equipment Utilized During Treatment: Gait belt Activity Tolerance: Treatment limited secondary to medical complications (Comment) Patient left: in bed;with call bell/phone within reach;with nursing/sitter in room Nurse Communication: Mobility status PT Visit Diagnosis: Unsteadiness on feet (R26.81);Dizziness and giddiness (R42)    Time: 3810-1751 PT Time Calculation (min) (ACUTE ONLY): 15 min   Charges:   PT Evaluation $PT Eval Moderate Complexity: 1 Mod         Ramond Dial 09/29/2019, 1:06 PM  Mee Hives, PT MS Acute Rehab Dept. Number: [place]

## 2019-09-29 NOTE — Care Management (Signed)
1751 09-29-19 Case Manager received consult for PCP needs. Patient is new to [facility] and is without PCP at this time. Patient wants to follow up with the [facility]-call was place to the pager # to make the physician aware- awaiting call back. Case Manager did discuss with patient regarding home health physical therapy-patient wants to think about services. Patient states he will see once he gets home if he needs the services. Case Manager did discuss that if services are not established prior to transition home, then his PCP would need to assist with order. Case Manager will continue to follow for additional transition of care needs. Graves-Bigelow, Ocie Cornfield , RN, BSN Case Manager

## 2019-09-29 NOTE — Progress Notes (Signed)
Family Medicine Teaching Service Daily Progress Note Intern Pager: [phone]  Patient name: Logan Robertson Medical record number: [id number] Date of birth: 08-Jun-1955 Age: 64 y.o. Gender: male  Primary Care Provider: No primary care provider on file. Consultants: Cardiology  Code Status: Full  Pt Overview and Major Events to Date:  09/28/19: Admitted, cards consulted   Assessment and Plan:  Logan Robertson is a 64 y.o. malewho presented with dyspnea, fatigue, chest tightness, found to have new onset arrhythmia.  Additionally, patient is new to the area, recently moved here from [place]. PMH is significant for cardiomyopathy, adenocarcinoma of prostate, hypertension, hyperlipidemia, Lyme disease, MI (2008?), angioedema due to ACE-I, BPH, CKD, elevated PSA, CVA (2010), thyrotoxicosis?, OSA.   Atrial Flutter, new onset  Pt with two day history of chest tightness that started while lifting heavy objects but resolved with rest with some relief with deep breathing. Chest pains concerned him as episodes would occur about every 30 minutes lasting for 2 to 3 minutes.  Patient has a history of heart attack (2008) but troponin's trended flat. ED EKG showed atrial flutter with predominant 4-1 AV block.  TSH was within normal limits.  Chest x-ray showed mildly increased bibasilar interstitial markings, may reflect mild edema versus atypical viral infection. Patient started on Cardizem in the emergency department.  Currently prescribed metoprolol 50 mg daily outpatient.  EKG on arrival showed atrial flutter with predominant 4-1 AV block.  He has intermittent A.flutter and NSR. -Cardiology consulted, appreciate recommendations  -Continue diltiazem gtt -Holding home metoprolol at this time -PT/INR, APTT -Lipid panel -Hemoglobin A1c -AM BMP and CBC -Strict I's and O's -PT/OT eval and treat -Continuous pulse ox, goal over 92% -Lower extremity ABIs  Lower extremity claudication Patient reporting  walking 100 feet or needing to stop.  He has occasionally push through the pain.  Suspect peripheral artery disease. -Follow-up on ABIs -Consider vascular consult   Hypertension Blood pressure 153/62 today. Patient with hx of uncontrolled HTN. Home medications include amlodipine 10, valsartan 320 mg daily, chlorthalidone, and metoprolol 50 mg daily. -Irbesartan 300 mg daily (valsartan is not formulary here) -Continue Cardizem drip -Holding amlodipine, metoprolol, chlorthalidone  Hypokalemia  Potassium 3.3 on admission. Today 3.2 today, replete with Kdur 40 mEq BID.  -s/p 40 mEq Kdur -AM BMP -Replete as necessary  Chronic Kidney Disease Stage IIIa Creatinine 1.58 and GFR 45, on admission.  Today  1.41, BUN 18.  -Monitor with BMP -Avoid nephrotoxic agents   Non-ischemic Cardiomyopathy  History of CAD?   Patient with hx of MI was in 2008 . Per cardiology notes from [place], he has h/o non-ischemic cardiomyopathy secondary to uncontrolled hypertension. Most recent Echo had normal EF. NST at that time showed no ischemia with an EF of 58%. No known heart cath or stenting. Home meds: ASA 81mg  QD, Atorvastatin, Valsartan. -Cardiology consulted, appreciate recommendations -Holding home aspirin at this time -Follow up ECHO   Hyperlipidemia Patient currently prescribed atorvastatin 40 mg.  LDL distribution 134. -Increase atorvastatin to 80 mg atorvastatin  History of Lymes Disease Per chart review, patient was diagnosed with Lymes Disease in 2020 with positive IgG and IgM labs. He completed a 4 month course of Doxycycline.  - PRN ibuprofen   History of PSA Discordant Intermediate risk Prostate Cancer Stable. Appears patient had a non-NS RALP (prostatectomy) in 02/2019 with an undetectable PSA in 04/2019. Follows with urologist in [place]; last seen May 2021.   OSA  Patient notes he wears CPAP nightly. - CPAP  qHS  FEN/GI: Heart healthy Prophylaxis: Heparin  drip  Disposition: likely home   Subjective:  Reports intermittent chest discomfort and  shortness of breath.  Objective: Temp:  [97.8 F (36.6 C)-98.6 F (37 C)] 98.2 F (36.8 C) (06/16 0500) Pulse Rate:  [52-158] 66 (06/16 0500) Resp:  [10-39] 21 (06/16 0500) BP: (130-186)/(62-133) 153/62 (06/16 0500) SpO2:  [84 %-100 %] 99 % (06/16 0500) Weight:  [126.8 kg-127.2 kg] 127.2 kg (06/16 0321) Physical Exam: General: Obese male, in no acute distress sitting on edge of the bed  Cardiovascular: Irregularly irregular rhythm and normal rate, distal pulses intact, no murmurs appreciated Respiratory: Clear lungs bilaterally, no increased work of breathing Abdomen: Abdomen soft, nontender, obese, nondistended Extremities: No lower extremity edema appreciated, nontender  Laboratory: Recent Labs  Lab 09/28/19 1032 09/29/19 0435  WBC 11.8* 10.7*  HGB 12.9* 12.8*  HCT 37.3* 36.0*  PLT 319 287   Recent Labs  Lab 09/28/19 1032 09/29/19 0435  NA 141 141  K 3.3* 3.2*  CL 106 107  CO2 26 24  BUN 20 18  CREATININE 1.58* 1.41*  CALCIUM 9.1 9.0  GLUCOSE 114* 99      Imaging/Diagnostic Tests: DG Chest 2 View  Result Date: 09/28/2019 CLINICAL DATA:  Chest pain, shortness of breath EXAM: CHEST - 2 VIEW COMPARISON:  None. FINDINGS: Heart size is mildly enlarged. Mildly increased bibasilar interstitial markings. No pleural effusion or pneumothorax. No acute osseous findings. IMPRESSION: 1. Mildly increased bibasilar interstitial markings, which may reflect mild edema versus atypical/viral infection. 2. Mild cardiomegaly. Electronically Signed   By: Davina Poke D.O.   On: 09/28/2019 10:46     Lyndee Hensen, DO 09/29/2019, 7:27 AM PGY-1, [place] Intern pager: [phone], text pages welcome

## 2019-09-29 NOTE — H&P (View-Only) (Signed)
Cardiology Consultation:   Patient ID: Logan Robertson MRN: [id number]; DOB: 25-May-1955  Admit date: 09/28/2019 Date of Consult: 09/29/2019  Primary Care Provider: No primary care provider on file. [facility] Cardiologist: Dr. [facility] Electrophysiologist:  None    Patient Profile:   Logan Robertson is a 64 y.o. male with a hx of cardiomyopathy, hyperlipidemia, hypertension, CAD with possible MI in 2008, history of Lyme disease, OSA on CPAP, CVA (although patient is unsure), prostate cancer who is being seen today for the evaluation of atrial flutter at the request of Dr Erin Hearing.  History of Present Illness:   Logan Robertson was previously seen Dr.Cosmi in [place] for the above cardiac issues. The patient reported that in 2008 he had a MI but because he did not have insurance he was treated medically and did not undergo cardiac catheterization.  Chart review reports he had nuclear stress test in 2013 showing EF of 30% along with an inferior scar. Echo showed EF of 40 to 45% with inferior hypokinesis.  He did not go for cath at that time. It was suspected CM due to uncontrolled HTN. 2018 echo was repeated showing normalization of EF. Nuclear stress test showed no ischemia with an EF of 58%. He was last seen for 07/2019 however notes cannot be reviewed.  The patient presented to the ER 09/28/2019 for shortness of breath and chest pain. The patient moved a week ago to [place] from [place]. He saw his cardiologist regularly prior to moving. He did not have his CPAP machine andunsure if he has had all his medications since arriving. The CP and sob started about 1-2 days prior. CP is described as a tightness in the center of the chest. It is non radiating and intermittent. It lasts no more than 5 minutes and seemed to be associated with the sob. It is worse with exertion. SOB has also been intermittent, about every once every 30 minutes the patient felt very sob and it was not related to exertion or  position. Denies LLE but does describe claudication symptoms in the right leg. No recent fever, chills, other illnesses.   The ED blood pressure 143/98, pulse 67, afebrile, respiratory rate 16.  Labs showed potassium 3.3, glucose 114, creatinine 1.58, WBC 11.8, hemoglobin 12.9.  BNP 343. HS troponin 40>32.  Covid negative. chest x-ray showed mildly increased bibasilar interstitial markings possible edema versus infection, mild cardiomegaly.  EKG showed atrial flutter with a rate of 70 bpm, possible LVH with repol abnormality and IVCD with QRS of 41 ms.  Patient was started on IV heparin and IV diltiazem and admitted for further work-up.  On my interview patient is chest pain free and breathing is stable. He is in sinus rhythm with rates 60-70s. Denies alcohol and tobacco use. He used drugs about 10 years ago.    Past Medical History:  Diagnosis Date  . Hypertension   . Lyme disease   . Myocardial infarction ([place])   . Sleep apnea     History reviewed. No pertinent surgical history.   Home Medications:  Prior to Admission medications   Medication Sig Start Date End Date Taking? Authorizing Provider  acetaminophen (TYLENOL) 325 MG tablet Take 650 mg by mouth every 6 (six) hours as needed for moderate pain.   Yes [provider]  amLODipine (NORVASC) 10 MG tablet Take 10 mg by mouth 2 (two) times daily. 07/21/19 07/20/20 Yes [provider]  atorvastatin (LIPITOR) 40 MG tablet Take 40 mg by mouth  daily. 08/26/18  Yes [provider]  chlorthalidone (HYGROTON) 25 MG tablet Take 25 mg by mouth 2 (two) times daily. 08/26/18  Yes [provider]  ibuprofen (ADVIL) 200 MG tablet Take 600 mg by mouth every 6 (six) hours as needed for moderate pain.   Yes [provider]  metoprolol tartrate (LOPRESSOR) 50 MG tablet Take 50 mg by mouth daily. 08/26/18  Yes [provider]  valsartan (DIOVAN) 320 MG tablet Take 320 mg by mouth daily. 05/14/19  Yes [provider]    Inpatient Medications: Scheduled Meds: . atorvastatin  40 mg Oral Daily  . irbesartan  300 mg Oral Daily  . potassium chloride  40 mEq Oral BID  . sodium chloride flush  3 mL Intravenous Once   Continuous Infusions: . diltiazem (CARDIZEM) infusion 5 mg/hr (09/28/19 1944)  . heparin 1,700 Units/hr (09/29/19 0716)   PRN Meds: acetaminophen, ondansetron (ZOFRAN) IV  Allergies:    Allergies  Allergen Reactions  . Lisinopril     Lip swelled up    Social History:   Social History   Socioeconomic History  . Marital status: Single    Spouse name: Not on file  . Number of children: Not on file  . Years of education: Not on file  . Highest education level: Not on file  Occupational History  . Not on file  Tobacco Use  . Smoking status: Former Research scientist (life sciences)  . Smokeless tobacco: Never Used  Substance and Sexual Activity  . Alcohol use: Not Currently  . Drug use: Not Currently  . Sexual activity: Not on file  Other Topics Concern  . Not on file  Social History Narrative  . Not on file   Social Determinants of Health   Financial Resource Strain:   . Difficulty of Paying Living Expenses:   Food Insecurity:   . Worried About Charity fundraiser in the Last Year:   . Arboriculturist in the Last Year:   Transportation Needs:   . Film/video editor (Medical):   Marland Kitchen Lack of Transportation (Non-Medical):   Physical Activity:   . Days of Exercise per Week:   . Minutes of Exercise per Session:   Stress:   . Feeling of Stress :   Social Connections:   . Frequency of Communication with Friends and Family:   . Frequency of Social Gatherings with Friends and Family:   . Attends Religious Services:   . Active Member of Clubs or Organizations:   . Attends Archivist Meetings:   Marland Kitchen Marital Status:   Intimate Partner Violence:   . Fear of Current or Ex-Partner:   . Emotionally Abused:   Marland Kitchen Physically Abused:   . Sexually Abused:     Family History:    *History reviewed. No pertinent family history.   ROS:  Please see the history of present illness.  All other ROS reviewed and negative.     Physical Exam/Data:   Vitals:   09/29/19 0154 09/29/19 0155 09/29/19 0321 09/29/19 0500  BP:  (!) 130/92  (!) 153/62  Pulse: (!) 130 76  66  Resp: 19 20  (!) 21  Temp:  98.1 F (36.7 C)  98.2 F (36.8 C)  TempSrc:    Oral  SpO2: 96% 98%  99%  Weight:   127.2 kg   Height:        Intake/Output Summary (Last 24 hours) at 09/29/2019 1000 Last data filed at 09/29/2019 0907 Gross per  24 hour  Intake 633.96 ml  Output 855 ml  Net -221.04 ml   Last 3 Weights 09/29/2019 09/28/2019 09/28/2019  Weight (lbs) 280 lb 8 oz 279 lb 8 oz 280 lb  Weight (kg) 127.234 kg 126.78 kg 127.007 kg     Body mass index is 38.04 kg/m.  General:  Well nourished, well developed, in no acute distress HEENT: normal Lymph: no adenopathy Neck: no JVD Endocrine:  No thryomegaly Vascular: No carotid bruits; FA pulses 2+ bilaterally without bruits  Cardiac:  normal S1, S2; RRR; no murmur  Lungs:  clear to auscultation bilaterally, no wheezing, rhonchi or rales  Abd: soft, nontender, no hepatomegaly  Ext: no edema Musculoskeletal:  No deformities, BUE and BLE strength normal and equal Skin: warm and dry  Neuro:  CNs 2-12 intact, no focal abnormalities noted Psych:  Normal affect   EKG:  The EKG was personally reviewed and demonstrates:  Aflutter  Telemetry:  Telemetry was personally reviewed and demonstrates:  NSR, HR 60-70s, PVCs and PACs  Relevant CV Studies:  Echo ordered  Nuclear stress test 07/2016 IMPRESSION:    1. A large in size, mild intensity, basal to distal inferior wall   attenuation is somewhat more intense and extends to  the infero-apex at rest. Otherwise normal perfusion throughout.   Prone images reveal a small to moderate in size,  severe intensity, infero-apical-lateral and distal infero-lateral and   apical-lateral and a small  in size, moderate  intensity, basal inferior wall defect with normal mid to distal   inferior wall perfusion.    2. Normal regadenoson ECG stress test.    3. Normal left ventricular systolic function and wall motion   post-stress, including the inferior and infero-apical  walls.    CONCLUSION - PROBABLE INFERIOR WALL ATTENUATION ARTIFACT. NO   DEFINITE AREAS OF MYOCARDIAL ISCHEMIA OF INFARCTION.   Echo 08/12/2016 Mitral Valve:  There is mild mitral annular calcification. No mitral valve regurgitation is detected.  Aortic Valve:  The aortic valve is trileaflet. Leaflet thickness and mobility are normal. No aortic stenosis is detected. No aortic regurgitation is detected.  Tricuspid Leaflet thickness and mobility are normal. Trace  tricuspid Valve regurgitation is detected. The estimated pulmonary artery systolic pressure is normal.  Pulmonic Valve: The pulmonic valve is not well visualized, but is grossly normal.  Left Atrium:There is borderline left atrial enlargement.  Left Ventricle: Left ventricular cavity size is normal. Mild  concentric hypertrophy is detected. No regional wall motion abnormalities are detected. Left ventricular systolic function is normal (>55%). Tissue Doppler imaging demonstrates a lateral wall E' that is abnormal for patient's age and suggests diastolic dysfunction.  Tissue Doppler imaging demonstrates an E/E' ratio that is  8-15, which is indeterminant for the estimation of left  sided  filling pressures.  Right Atrium: The right atrium is normal in size.  Right Ventricle: Normal right ventricular cavity size and systolic function.  Pericardium: There is no pericardial effusion.  Pleura: No significant pleural effusion is detected.  Miscellaneous:The IVC is less than 2.1 cm and is > 50% collapsible consistent with a right atrial pressure of  approximately 3 mmHg. The aortic root diameter is normal.   Conclusions  Summary  Left  ventricular systolic function is normal (>55%).  Tissue Doppler imaging demonstrates a lateral wall E' that is  abnormal for patient's age and suggests diastolic dysfunction   Laboratory Data:  High Sensitivity Troponin:   Recent Labs  Lab 09/28/19 1032 09/28/19 1255  TROPONINIHS  40* 32*     Chemistry Recent Labs  Lab 09/28/19 1032 09/29/19 0435  NA 141 141  K 3.3* 3.2*  CL 106 107  CO2 26 24  GLUCOSE 114* 99  BUN 20 18  CREATININE 1.58* 1.41*  CALCIUM 9.1 9.0  GFRNONAA 45* 52*  GFRAA 52* 60*  ANIONGAP 9 10    No results for input(s): PROT, ALBUMIN, AST, ALT, ALKPHOS, BILITOT in the last 168 hours. Hematology Recent Labs  Lab 09/28/19 1032 09/29/19 0435  WBC 11.8* 10.7*  RBC 4.43 4.34  HGB 12.9* 12.8*  HCT 37.3* 36.0*  MCV 84.2 82.9  MCH 29.1 29.5  MCHC 34.6 35.6  RDW 14.4 14.2  PLT 319 287   BNP Recent Labs  Lab 09/28/19 1302  BNP 343.3*    DDimer  Recent Labs  Lab 09/28/19 1650  DDIMER 0.61*     Radiology/Studies:  DG Chest 2 View  Result Date: 09/28/2019 CLINICAL DATA:  Chest pain, shortness of breath EXAM: CHEST - 2 VIEW COMPARISON:  None. FINDINGS: Heart size is mildly enlarged. Mildly increased bibasilar interstitial markings. No pleural effusion or pneumothorax. No acute osseous findings. IMPRESSION: 1. Mildly increased bibasilar interstitial markings, which may reflect mild edema versus atypical/viral infection. 2. Mild cardiomegaly. Electronically Signed   By: Davina Poke D.O.   On: 09/28/2019 10:46    HEAR Score (for undifferentiated chest pain):  HEAR Score: 6     Assessment and Plan:   New onset a flutter  Presented with chest tightness and dyspnea for the last 2 days. In the ED BNP 343. Troponin mildly elevated with flat trend.  EKG shows a flutter with controlled rates.  Chest x-ray with possible edema versus atypical infection.  Found to be in new onset a flutter with rates 80-130. -IV heparin -CHA2DS2-VASc = 6 (CHF, age,  hypertension, ?stroke x2, CAD) he will need longterm a/c but will keep heparin for now until echo results come back -TSH 1.5 -Check magnesium.  Potassium 3.2.  Keep K+ > 4 and mag > 2 - Rates controlled, pressures up. Restart BB - Can likely switch dilt to PO dilt - Echo ordered. If EF is low he will likely need further ischemic work-up  ?Acute on chronic diastolic heart failure/history of systolic heart failure -In the setting of new onset atrial flutter -Echo from 2018 showed EF >99% with diastolic dysfunction, reportedly EF improved form prior - Repeat echo ordered - At baseline patient was on metoprolol 50 mg daily, valsartan 320 mg daily, chlorthalidone 25 mg daily - BNP 343 on admission and CXR with edema - He was given IV lasix 20 mg in the ED - Do not appreciate LLE or JVD on exam, lungs clear - creatinine 1.58>1.41 - Appears euvolemic on exam although difficult to assess given size - Md to see  Cardiomyopathy -Unsure if this is ischemic or nonischemic, patient reported MI in 2008 (no cath) treated medically. Question also if uncontrolled BP contributing -Per chart review rest test in 2013 showed EF of 30% with an inferior scar and echo showed EF of 40 to 45%.  In 2018 he had repeat testing that showed normalization of his EF - Echo ordered  CAD, history of MI in 2008 - troponin mildly elevated with flat trend which is likely demand ischemia however does report CAD h/o - If EF is low on echo will likely plan for cath - continue aspirin and statin  Hypertension -IV Cardizem -Patient was on amlodipine 10 mg, valsartan  320 mg daily, chlorthalidone, metoprolol 50 mg daily - ARB continued on admission - Pressures up, restart metoprolol  Hypokalemia -Replete as needed  HLD - atorvastatin 40 mg daily - LDL 134, HDL 42, TG 98  CKD stage III -Unsure of patient's baseline -Creatinine 1.58 on admission with GFR 45 - creatinine 1.2 in 02/2019  OSA -CPAP  For questions or  updates, please contact [facility] Please consult [email or web address] for contact info under    Signed, Lerin Jech Ninfa Meeker, PA-C  09/29/2019 10:00 AM

## 2019-09-29 NOTE — Progress Notes (Signed)
OT Cancellation Note  Patient Details Name: Yakub Lodes MRN: [id number] DOB: 1955/05/12   Cancelled Treatment:    Reason Eval/Treat Not Completed: Medical issues which prohibited therapy (heparin level was not therapeutic this AM and BP too high. OT to follow next available treatment time for OT eval.  Jefferey Pica, OTR/L Acute Rehabilitation Services Pager: [phone] Office: [phone]   Marikay Roads C 09/29/2019, 3:39 PM

## 2019-09-29 NOTE — Consult Note (Signed)
Cardiology Consultation:   Patient ID: Logan Robertson MRN: [id number]; DOB: [birth year]  Admit date: 09/28/2019 Date of Consult: 09/29/2019  Primary Care Provider: No primary care provider on file. [facility] Cardiologist: Dr. [facility] Electrophysiologist:  None    Patient Profile:   Logan Robertson is a 64 y.o. male with a hx of cardiomyopathy, hyperlipidemia, hypertension, CAD with possible MI in 2008, history of Lyme disease, OSA on CPAP, CVA (although patient is unsure), prostate cancer who is being seen today for the evaluation of atrial flutter at the request of Dr Erin Hearing.  History of Present Illness:   [name] was previously seen Dr.Cosmi in [place] for the above cardiac issues. The patient reported that in 2008 he had a MI but because he did not have insurance he was treated medically and did not undergo cardiac catheterization.  Chart review reports he had nuclear stress test in 2013 showing EF of 30% along with an inferior scar. Echo showed EF of 40 to 45% with inferior hypokinesis.  He did not go for cath at that time. It was suspected CM due to uncontrolled HTN. 2018 echo was repeated showing normalization of EF. Nuclear stress test showed no ischemia with an EF of 58%. He was last seen for 07/2019 however notes cannot be reviewed.  The patient presented to the ER 09/28/2019 for shortness of breath and chest pain. The patient moved a week ago to [place] from [place]. He saw his cardiologist regularly prior to moving. He did not have his CPAP machine andunsure if he has had all his medications since arriving. The CP and sob started about 1-2 days prior. CP is described as a tightness in the center of the chest. It is non radiating and intermittent. It lasts no more than 5 minutes and seemed to be associated with the sob. It is worse with exertion. SOB has also been intermittent, about every once every 30 minutes the patient felt very sob and it was not related to exertion or  position. Denies LLE but does describe claudication symptoms in the right leg. No recent fever, chills, other illnesses.   The ED blood pressure 143/98, pulse 67, afebrile, respiratory rate 16.  Labs showed potassium 3.3, glucose 114, creatinine 1.58, WBC 11.8, hemoglobin 12.9.  BNP 343. HS troponin 40>32.  Covid negative. chest x-ray showed mildly increased bibasilar interstitial markings possible edema versus infection, mild cardiomegaly.  EKG showed atrial flutter with a rate of 70 bpm, possible LVH with repol abnormality and IVCD with QRS of 41 ms.  Patient was started on IV heparin and IV diltiazem and admitted for further work-up.  On my interview patient is chest pain free and breathing is stable. He is in sinus rhythm with rates 60-70s. Denies alcohol and tobacco use. He used drugs about 10 years ago.    Past Medical History:  Diagnosis Date  . Hypertension   . Lyme disease   . Myocardial infarction ([facility])   . Sleep apnea     History reviewed. No pertinent surgical history.   Home Medications:  Prior to Admission medications   Medication Sig Start Date End Date Taking? Authorizing Provider  acetaminophen (TYLENOL) 325 MG tablet Take 650 mg by mouth every 6 (six) hours as needed for moderate pain.   Yes [provider]  amLODipine (NORVASC) 10 MG tablet Take 10 mg by mouth 2 (two) times daily. 07/21/19 07/20/20 Yes [provider]  atorvastatin (LIPITOR) 40 MG tablet Take 40 mg by mouth  daily. 08/26/18  Yes [provider]  chlorthalidone (HYGROTON) 25 MG tablet Take 25 mg by mouth 2 (two) times daily. 08/26/18  Yes [provider]  ibuprofen (ADVIL) 200 MG tablet Take 600 mg by mouth every 6 (six) hours as needed for moderate pain.   Yes [provider]  metoprolol tartrate (LOPRESSOR) 50 MG tablet Take 50 mg by mouth daily. 08/26/18  Yes [provider]  valsartan (DIOVAN) 320 MG tablet Take 320 mg by mouth daily. 05/14/19  Yes [provider]    Inpatient Medications: Scheduled Meds: . atorvastatin  40 mg Oral Daily  . irbesartan  300 mg Oral Daily  . potassium chloride  40 mEq Oral BID  . sodium chloride flush  3 mL Intravenous Once   Continuous Infusions: . diltiazem (CARDIZEM) infusion 5 mg/hr (09/28/19 1944)  . heparin 1,700 Units/hr (09/29/19 0716)   PRN Meds: acetaminophen, ondansetron (ZOFRAN) IV  Allergies:    Allergies  Allergen Reactions  . Lisinopril     Lip swelled up    Social History:   Social History   Socioeconomic History  . Marital status: Single    Spouse name: Not on file  . Number of children: Not on file  . Years of education: Not on file  . Highest education level: Not on file  Occupational History  . Not on file  Tobacco Use  . Smoking status: Former Research scientist (life sciences)  . Smokeless tobacco: Never Used  Substance and Sexual Activity  . Alcohol use: Not Currently  . Drug use: Not Currently  . Sexual activity: Not on file  Other Topics Concern  . Not on file  Social History Narrative  . Not on file   Social Determinants of Health   Financial Resource Strain:   . Difficulty of Paying Living Expenses:   Food Insecurity:   . Worried About Charity fundraiser in the Last Year:   . Arboriculturist in the Last Year:   Transportation Needs:   . Film/video editor (Medical):   Marland Kitchen Lack of Transportation (Non-Medical):   Physical Activity:   . Days of Exercise per Week:   . Minutes of Exercise per Session:   Stress:   . Feeling of Stress :   Social Connections:   . Frequency of Communication with Friends and Family:   . Frequency of Social Gatherings with Friends and Family:   . Attends Religious Services:   . Active Member of Clubs or Organizations:   . Attends Archivist Meetings:   Marland Kitchen Marital Status:   Intimate Partner Violence:   . Fear of Current or Ex-Partner:   . Emotionally Abused:   Marland Kitchen Physically Abused:   . Sexually Abused:     Family History:    *History reviewed. No pertinent family history.   ROS:  Please see the history of present illness.  All other ROS reviewed and negative.     Physical Exam/Data:   Vitals:   09/29/19 0154 09/29/19 0155 09/29/19 0321 09/29/19 0500  BP:  (!) 130/92  (!) 153/62  Pulse: (!) 130 76  66  Resp: 19 20  (!) 21  Temp:  98.1 F (36.7 C)  98.2 F (36.8 C)  TempSrc:    Oral  SpO2: 96% 98%  99%  Weight:   127.2 kg   Height:        Intake/Output Summary (Last 24 hours) at 09/29/2019 1000 Last data filed at 09/29/2019 0907 Gross per  24 hour  Intake 633.96 ml  Output 855 ml  Net -221.04 ml   Last 3 Weights 09/29/2019 09/28/2019 09/28/2019  Weight (lbs) 280 lb 8 oz 279 lb 8 oz 280 lb  Weight (kg) 127.234 kg 126.78 kg 127.007 kg     Body mass index is 38.04 kg/m.  General:  Well nourished, well developed, in no acute distress HEENT: normal Lymph: no adenopathy Neck: no JVD Endocrine:  No thryomegaly Vascular: No carotid bruits; FA pulses 2+ bilaterally without bruits  Cardiac:  normal S1, S2; RRR; no murmur  Lungs:  clear to auscultation bilaterally, no wheezing, rhonchi or rales  Abd: soft, nontender, no hepatomegaly  Ext: no edema Musculoskeletal:  No deformities, BUE and BLE strength normal and equal Skin: warm and dry  Neuro:  CNs 2-12 intact, no focal abnormalities noted Psych:  Normal affect   EKG:  The EKG was personally reviewed and demonstrates:  Aflutter  Telemetry:  Telemetry was personally reviewed and demonstrates:  NSR, HR 60-70s, PVCs and PACs  Relevant CV Studies:  Echo ordered  Nuclear stress test 07/2016 IMPRESSION:    1. A large in size, mild intensity, basal to distal inferior wall   attenuation is somewhat more intense and extends to  the infero-apex at rest. Otherwise normal perfusion throughout.   Prone images reveal a small to moderate in size,  severe intensity, infero-apical-lateral and distal infero-lateral and   apical-lateral and a small  in size, moderate  intensity, basal inferior wall defect with normal mid to distal   inferior wall perfusion.    2. Normal regadenoson ECG stress test.    3. Normal left ventricular systolic function and wall motion   post-stress, including the inferior and infero-apical  walls.    CONCLUSION - PROBABLE INFERIOR WALL ATTENUATION ARTIFACT. NO   DEFINITE AREAS OF MYOCARDIAL ISCHEMIA OF INFARCTION.   Echo 08/12/2016 Mitral Valve:  There is mild mitral annular calcification. No mitral valve regurgitation is detected.  Aortic Valve:  The aortic valve is trileaflet. Leaflet thickness and mobility are normal. No aortic stenosis is detected. No aortic regurgitation is detected.  Tricuspid Leaflet thickness and mobility are normal. Trace  tricuspid Valve regurgitation is detected. The estimated pulmonary artery systolic pressure is normal.  Pulmonic Valve: The pulmonic valve is not well visualized, but is grossly normal.  Left Atrium:There is borderline left atrial enlargement.  Left Ventricle: Left ventricular cavity size is normal. Mild  concentric hypertrophy is detected. No regional wall motion abnormalities are detected. Left ventricular systolic function is normal (>55%). Tissue Doppler imaging demonstrates a lateral wall E' that is abnormal for patient's age and suggests diastolic dysfunction.  Tissue Doppler imaging demonstrates an E/E' ratio that is  8-15, which is indeterminant for the estimation of left  sided  filling pressures.  Right Atrium: The right atrium is normal in size.  Right Ventricle: Normal right ventricular cavity size and systolic function.  Pericardium: There is no pericardial effusion.  Pleura: No significant pleural effusion is detected.  Miscellaneous:The IVC is less than 2.1 cm and is > 50% collapsible consistent with a right atrial pressure of  approximately 3 mmHg. The aortic root diameter is normal.   Conclusions  Summary  Left  ventricular systolic function is normal (>55%).  Tissue Doppler imaging demonstrates a lateral wall E' that is  abnormal for patient's age and suggests diastolic dysfunction   Laboratory Data:  High Sensitivity Troponin:   Recent Labs  Lab 09/28/19 1032 09/28/19 1255  TROPONINIHS  40* 32*     Chemistry Recent Labs  Lab 09/28/19 1032 09/29/19 0435  NA 141 141  K 3.3* 3.2*  CL 106 107  CO2 26 24  GLUCOSE 114* 99  BUN 20 18  CREATININE 1.58* 1.41*  CALCIUM 9.1 9.0  GFRNONAA 45* 52*  GFRAA 52* 60*  ANIONGAP 9 10    No results for input(s): PROT, ALBUMIN, AST, ALT, ALKPHOS, BILITOT in the last 168 hours. Hematology Recent Labs  Lab 09/28/19 1032 09/29/19 0435  WBC 11.8* 10.7*  RBC 4.43 4.34  HGB 12.9* 12.8*  HCT 37.3* 36.0*  MCV 84.2 82.9  MCH 29.1 29.5  MCHC 34.6 35.6  RDW 14.4 14.2  PLT 319 287   BNP Recent Labs  Lab 09/28/19 1302  BNP 343.3*    DDimer  Recent Labs  Lab 09/28/19 1650  DDIMER 0.61*     Radiology/Studies:  DG Chest 2 View  Result Date: 09/28/2019 CLINICAL DATA:  Chest pain, shortness of breath EXAM: CHEST - 2 VIEW COMPARISON:  None. FINDINGS: Heart size is mildly enlarged. Mildly increased bibasilar interstitial markings. No pleural effusion or pneumothorax. No acute osseous findings. IMPRESSION: 1. Mildly increased bibasilar interstitial markings, which may reflect mild edema versus atypical/viral infection. 2. Mild cardiomegaly. Electronically Signed   By: Davina Poke D.O.   On: 09/28/2019 10:46    HEAR Score (for undifferentiated chest pain):  HEAR Score: 6     Assessment and Plan:   New onset a flutter  Presented with chest tightness and dyspnea for the last 2 days. In the ED BNP 343. Troponin mildly elevated with flat trend.  EKG shows a flutter with controlled rates.  Chest x-ray with possible edema versus atypical infection.  Found to be in new onset a flutter with rates 80-130. -IV heparin -CHA2DS2-VASc = 6 (CHF, age,  hypertension, ?stroke x2, CAD) he will need longterm a/c but will keep heparin for now until echo results come back -TSH 1.5 -Check magnesium.  Potassium 3.2.  Keep K+ > 4 and mag > 2 - Rates controlled, pressures up. Restart BB - Can likely switch dilt to PO dilt - Echo ordered. If EF is low he will likely need further ischemic work-up  ?Acute on chronic diastolic heart failure/history of systolic heart failure -In the setting of new onset atrial flutter -Echo from 2018 showed EF >00% with diastolic dysfunction, reportedly EF improved form prior - Repeat echo ordered - At baseline patient was on metoprolol 50 mg daily, valsartan 320 mg daily, chlorthalidone 25 mg daily - BNP 343 on admission and CXR with edema - He was given IV lasix 20 mg in the ED - Do not appreciate LLE or JVD on exam, lungs clear - creatinine 1.58>1.41 - Appears euvolemic on exam although difficult to assess given size - Md to see  Cardiomyopathy -Unsure if this is ischemic or nonischemic, patient reported MI in 2008 (no cath) treated medically. Question also if uncontrolled BP contributing -Per chart review rest test in 2013 showed EF of 30% with an inferior scar and echo showed EF of 40 to 45%.  In 2018 he had repeat testing that showed normalization of his EF - Echo ordered  CAD, history of MI in 2008 - troponin mildly elevated with flat trend which is likely demand ischemia however does report CAD h/o - If EF is low on echo will likely plan for cath - continue aspirin and statin  Hypertension -IV Cardizem -Patient was on amlodipine 10 mg, valsartan  320 mg daily, chlorthalidone, metoprolol 50 mg daily - ARB continued on admission - Pressures up, restart metoprolol  Hypokalemia -Replete as needed  HLD - atorvastatin 40 mg daily - LDL 134, HDL 42, TG 98  CKD stage III -Unsure of patient's baseline -Creatinine 1.58 on admission with GFR 45 - creatinine 1.2 in 02/2019  OSA -CPAP  For questions or  updates, please contact [facility] Please consult [email or web address] for contact info under    Signed, Mckinley Olheiser Ninfa Meeker, PA-C  09/29/2019 10:00 AM

## 2019-09-29 NOTE — Care Management Obs Status (Signed)
[facility] NOTIFICATION   Patient Details  Name: Logan Robertson MRN: [id number] Date of Birth: 1955-12-18   Medicare Observation Status Notification Given:  Yes    Bethena Roys, RN 09/29/2019, 5:46 PM

## 2019-09-29 NOTE — Progress Notes (Signed)
ANTICOAGULATION CONSULT NOTE - Initial Consult  Pharmacy Consult for heparin Indication: atrial fibrillation  Allergies  Allergen Reactions  . Lisinopril     Lip swelled up    Patient Measurements: Height: 6' (182.9 cm) Weight: 127.2 kg (280 lb 8 oz) IBW/kg (Calculated) : 77.6 Heparin Dosing Weight: 106 kg   Vital Signs: Temp: 98.2 F (36.8 C) (06/16 0500) Temp Source: Oral (06/16 0500) BP: 153/62 (06/16 0500) Pulse Rate: 66 (06/16 0500)  Labs: Recent Labs    09/28/19 1032 09/28/19 1255 09/29/19 0009 09/29/19 0435 09/29/19 0919  HGB 12.9*  --   --  12.8*  --   HCT 37.3*  --   --  36.0*  --   PLT 319  --   --  287  --   APTT  --   --   --  88*  --   LABPROT  --   --   --  13.9  --   INR  --   --   --  1.1  --   HEPARINUNFRC  --   --  0.37  --  0.66  CREATININE 1.58*  --   --  1.41*  --   TROPONINIHS 40* 32*  --   --   --     Estimated Creatinine Clearance: 71 mL/min (A) (by C-G formula based on SCr of 1.41 mg/dL (H)).   Medical History: Past Medical History:  Diagnosis Date  . Hypertension   . Lyme disease   . Myocardial infarction ([facility])   . Sleep apnea     Medications:  Scheduled:  . atorvastatin  40 mg Oral Daily  . irbesartan  300 mg Oral Daily  . potassium chloride  40 mEq Oral BID  . sodium chloride flush  3 mL Intravenous Once    Assessment: 1 yom presenting with CP and found to have atrial fibrillation on EKG, no anticoagulation noted PTA. CHADSVASc at least 2 given known history. Baseline Hgb 12.9, platelets 319.   6/16 AM update: Heparin level 0.66 (high-therapeutic) on heparin gtt 1700 units/hr. Hbg 12.8 (stable), plts wnl. No concerns noted for bleeding.   Goal of Therapy:  Heparin level 0.3-0.7 units/ml Monitor platelets by anticoagulation protocol: Yes   Plan:  Slightly decrease heparin infusion to 1650 units/hr Check anti-Xa level in 6 hours and daily while on heparin Continue to monitor H&H and platelets F/u plans for oral  anticoagulation    Thank you,   Acey Lav, PharmD  PGY1 [place] Resident  Please check amion for clinical pharmacist contact number 09/29/2019,11:47 AM

## 2019-09-30 ENCOUNTER — Encounter (HOSPITAL_COMMUNITY): Payer: Self-pay | Admitting: Interventional Cardiology

## 2019-09-30 ENCOUNTER — Encounter (HOSPITAL_COMMUNITY): Payer: Medicare HMO

## 2019-09-30 ENCOUNTER — Encounter (HOSPITAL_COMMUNITY)
Admission: AD | Disposition: A | Discharge status: Home / Self Care | Payer: Self-pay | Source: Home / Self Care | Attending: Family Medicine

## 2019-09-30 DIAGNOSIS — I5043 Acute on chronic combined systolic (congestive) and diastolic (congestive) heart failure: Secondary | ICD-10-CM

## 2019-09-30 DIAGNOSIS — I252 Old myocardial infarction: Secondary | ICD-10-CM | POA: Diagnosis not present

## 2019-09-30 DIAGNOSIS — I4892 Unspecified atrial flutter: Secondary | ICD-10-CM | POA: Diagnosis present

## 2019-09-30 DIAGNOSIS — I272 Pulmonary hypertension, unspecified: Secondary | ICD-10-CM | POA: Diagnosis present

## 2019-09-30 DIAGNOSIS — Z8619 Personal history of other infectious and parasitic diseases: Secondary | ICD-10-CM | POA: Diagnosis not present

## 2019-09-30 DIAGNOSIS — R7303 Prediabetes: Secondary | ICD-10-CM | POA: Diagnosis not present

## 2019-09-30 DIAGNOSIS — N183 Chronic kidney disease, stage 3 unspecified: Secondary | ICD-10-CM

## 2019-09-30 DIAGNOSIS — I130 Hypertensive heart and chronic kidney disease with heart failure and stage 1 through stage 4 chronic kidney disease, or unspecified chronic kidney disease: Secondary | ICD-10-CM | POA: Diagnosis not present

## 2019-09-30 DIAGNOSIS — E876 Hypokalemia: Secondary | ICD-10-CM | POA: Diagnosis present

## 2019-09-30 DIAGNOSIS — Z20822 Contact with and (suspected) exposure to covid-19: Secondary | ICD-10-CM | POA: Diagnosis not present

## 2019-09-30 DIAGNOSIS — I483 Typical atrial flutter: Secondary | ICD-10-CM

## 2019-09-30 DIAGNOSIS — I2510 Atherosclerotic heart disease of native coronary artery without angina pectoris: Secondary | ICD-10-CM | POA: Diagnosis present

## 2019-09-30 DIAGNOSIS — I77810 Thoracic aortic ectasia: Secondary | ICD-10-CM | POA: Diagnosis present

## 2019-09-30 DIAGNOSIS — I5023 Acute on chronic systolic (congestive) heart failure: Secondary | ICD-10-CM | POA: Diagnosis not present

## 2019-09-30 DIAGNOSIS — N1831 Chronic kidney disease, stage 3a: Secondary | ICD-10-CM | POA: Diagnosis not present

## 2019-09-30 DIAGNOSIS — E669 Obesity, unspecified: Secondary | ICD-10-CM | POA: Diagnosis present

## 2019-09-30 DIAGNOSIS — I493 Ventricular premature depolarization: Secondary | ICD-10-CM | POA: Diagnosis present

## 2019-09-30 DIAGNOSIS — E785 Hyperlipidemia, unspecified: Secondary | ICD-10-CM | POA: Diagnosis not present

## 2019-09-30 DIAGNOSIS — I739 Peripheral vascular disease, unspecified: Secondary | ICD-10-CM | POA: Diagnosis present

## 2019-09-30 DIAGNOSIS — I509 Heart failure, unspecified: Secondary | ICD-10-CM | POA: Diagnosis not present

## 2019-09-30 DIAGNOSIS — Z87891 Personal history of nicotine dependence: Secondary | ICD-10-CM | POA: Diagnosis not present

## 2019-09-30 DIAGNOSIS — I428 Other cardiomyopathies: Secondary | ICD-10-CM

## 2019-09-30 DIAGNOSIS — G4733 Obstructive sleep apnea (adult) (pediatric): Secondary | ICD-10-CM | POA: Diagnosis not present

## 2019-09-30 DIAGNOSIS — I10 Essential (primary) hypertension: Secondary | ICD-10-CM | POA: Diagnosis not present

## 2019-09-30 DIAGNOSIS — I440 Atrioventricular block, first degree: Secondary | ICD-10-CM | POA: Diagnosis not present

## 2019-09-30 DIAGNOSIS — Z79899 Other long term (current) drug therapy: Secondary | ICD-10-CM | POA: Diagnosis not present

## 2019-09-30 DIAGNOSIS — Z8546 Personal history of malignant neoplasm of prostate: Secondary | ICD-10-CM | POA: Diagnosis not present

## 2019-09-30 DIAGNOSIS — Z6838 Body mass index (BMI) 38.0-38.9, adult: Secondary | ICD-10-CM | POA: Diagnosis not present

## 2019-09-30 HISTORY — PX: RIGHT/LEFT HEART CATH AND CORONARY ANGIOGRAPHY: CATH118266

## 2019-09-30 LAB — POCT I-STAT EG7
Acid-Base Excess: 2 mmol/L (ref 0.0–2.0)
Bicarbonate: 27.2 mmol/L (ref 20.0–28.0)
Calcium, Ion: 1.26 mmol/L (ref 1.15–1.40)
HCT: 36 % — ABNORMAL LOW (ref 39.0–52.0)
Hemoglobin: 12.2 g/dL — ABNORMAL LOW (ref 13.0–17.0)
O2 Saturation: 69 %
Potassium: 3.4 mmol/L — ABNORMAL LOW (ref 3.5–5.1)
Sodium: 143 mmol/L (ref 135–145)
TCO2: 28 mmol/L (ref 22–32)
pCO2, Ven: 43.1 mmHg — ABNORMAL LOW (ref 44.0–60.0)
pH, Ven: 7.408 (ref 7.250–7.430)
pO2, Ven: 36 mmHg (ref 32.0–45.0)

## 2019-09-30 LAB — POCT I-STAT 7, (LYTES, BLD GAS, ICA,H+H)
Acid-base deficit: 1 mmol/L (ref 0.0–2.0)
Bicarbonate: 23.2 mmol/L (ref 20.0–28.0)
Calcium, Ion: 1.24 mmol/L (ref 1.15–1.40)
HCT: 37 % — ABNORMAL LOW (ref 39.0–52.0)
Hemoglobin: 12.6 g/dL — ABNORMAL LOW (ref 13.0–17.0)
O2 Saturation: 97 %
Potassium: 3.4 mmol/L — ABNORMAL LOW (ref 3.5–5.1)
Sodium: 143 mmol/L (ref 135–145)
TCO2: 24 mmol/L (ref 22–32)
pCO2 arterial: 35.3 mmHg (ref 32.0–48.0)
pH, Arterial: 7.425 (ref 7.350–7.450)
pO2, Arterial: 90 mmHg (ref 83.0–108.0)

## 2019-09-30 LAB — CBC
HCT: 34.8 % — ABNORMAL LOW (ref 39.0–52.0)
Hemoglobin: 12.4 g/dL — ABNORMAL LOW (ref 13.0–17.0)
MCH: 29.7 pg (ref 26.0–34.0)
MCHC: 35.6 g/dL (ref 30.0–36.0)
MCV: 83.5 fL (ref 80.0–100.0)
Platelets: 271 10*3/uL (ref 150–400)
RBC: 4.17 MIL/uL — ABNORMAL LOW (ref 4.22–5.81)
RDW: 14.1 % (ref 11.5–15.5)
WBC: 9.3 10*3/uL (ref 4.0–10.5)
nRBC: 0 % (ref 0.0–0.2)

## 2019-09-30 LAB — BASIC METABOLIC PANEL
Anion gap: 9 (ref 5–15)
BUN: 18 mg/dL (ref 8–23)
CO2: 24 mmol/L (ref 22–32)
Calcium: 9 mg/dL (ref 8.9–10.3)
Chloride: 107 mmol/L (ref 98–111)
Creatinine, Ser: 1.35 mg/dL — ABNORMAL HIGH (ref 0.61–1.24)
GFR calc Af Amer: >60 mL/min (ref >60)
GFR calc non Af Amer: 54 mL/min — ABNORMAL LOW (ref >60)
Glucose, Bld: 93 mg/dL (ref 70–99)
Potassium: 3.6 mmol/L (ref 3.5–5.1)
Sodium: 140 mmol/L (ref 135–145)

## 2019-09-30 LAB — HEMOGLOBIN A1C
Hgb A1c MFr Bld: 5.8 % — ABNORMAL HIGH (ref 4.8–5.6)
Mean Plasma Glucose: 120 mg/dL

## 2019-09-30 LAB — HEPARIN LEVEL (UNFRACTIONATED): Heparin Unfractionated: 0.86 IU/mL — ABNORMAL HIGH (ref 0.30–0.70)

## 2019-09-30 SURGERY — RIGHT/LEFT HEART CATH AND CORONARY ANGIOGRAPHY
Anesthesia: LOCAL

## 2019-09-30 MED ORDER — IOHEXOL 350 MG/ML SOLN
INTRAVENOUS | Status: DC | PRN
  Administered 2019-09-30: 75 mL via INTRA_ARTERIAL

## 2019-09-30 MED ORDER — IOHEXOL 350 MG/ML SOLN
INTRAVENOUS | Status: AC
  Filled 2019-09-30: qty 1

## 2019-09-30 MED ORDER — SODIUM CHLORIDE 0.9 % IV SOLN: INTRAVENOUS | Status: DC

## 2019-09-30 MED ORDER — HYDRALAZINE HCL 20 MG/ML IJ SOLN: 10.0000 mg | INTRAMUSCULAR | Status: DC | PRN

## 2019-09-30 MED ORDER — ACETAMINOPHEN 325 MG PO TABS: 650.0000 mg | ORAL_TABLET | ORAL | Status: DC | PRN

## 2019-09-30 MED ORDER — LIDOCAINE HCL (PF) 1 % IJ SOLN
INTRAMUSCULAR | Status: DC | PRN
  Administered 2019-09-30: 2 mL
  Administered 2019-09-30: 16 mL via INTRADERMAL

## 2019-09-30 MED ORDER — HYDRALAZINE HCL 25 MG PO TABS
25.0000 mg | ORAL_TABLET | Freq: Three times a day (TID) | ORAL | Status: DC
  Administered 2019-09-30 – 2019-10-01 (×3): 25 mg via ORAL
  Filled 2019-09-30 (×3): qty 1

## 2019-09-30 MED ORDER — ONDANSETRON HCL 4 MG/2ML IJ SOLN: 4.0000 mg | Freq: Four times a day (QID) | INTRAMUSCULAR | Status: DC | PRN

## 2019-09-30 MED ORDER — MIDAZOLAM HCL 2 MG/2ML IJ SOLN
INTRAMUSCULAR | Status: DC | PRN
  Administered 2019-09-30: 1 mg via INTRAVENOUS
  Administered 2019-09-30: 2 mg via INTRAVENOUS

## 2019-09-30 MED ORDER — SODIUM CHLORIDE 0.9% FLUSH: 3.0000 mL | INTRAVENOUS | Status: DC | PRN

## 2019-09-30 MED ORDER — LABETALOL HCL 5 MG/ML IV SOLN: 10.0000 mg | INTRAVENOUS | Status: DC | PRN

## 2019-09-30 MED ORDER — APIXABAN 5 MG PO TABS
5.0000 mg | ORAL_TABLET | Freq: Two times a day (BID) | ORAL | Status: DC
  Administered 2019-10-01: 5 mg via ORAL
  Filled 2019-09-30: qty 1

## 2019-09-30 MED ORDER — FENTANYL CITRATE (PF) 100 MCG/2ML IJ SOLN
INTRAMUSCULAR | Status: AC
  Filled 2019-09-30: qty 2

## 2019-09-30 MED ORDER — METOPROLOL TARTRATE 5 MG/5ML IV SOLN
INTRAVENOUS | Status: AC
  Filled 2019-09-30: qty 5

## 2019-09-30 MED ORDER — ISOSORBIDE DINITRATE 10 MG PO TABS
10.0000 mg | ORAL_TABLET | Freq: Three times a day (TID) | ORAL | Status: DC
  Administered 2019-09-30 (×2): 10 mg via ORAL
  Filled 2019-09-30 (×2): qty 1

## 2019-09-30 MED ORDER — METOPROLOL TARTRATE 5 MG/5ML IV SOLN
INTRAVENOUS | Status: DC | PRN
  Administered 2019-09-30: 5 mg via INTRAVENOUS

## 2019-09-30 MED ORDER — MIDAZOLAM HCL 2 MG/2ML IJ SOLN
INTRAMUSCULAR | Status: AC
  Filled 2019-09-30: qty 2

## 2019-09-30 MED ORDER — HYDRALAZINE HCL 20 MG/ML IJ SOLN
INTRAMUSCULAR | Status: DC | PRN
  Administered 2019-09-30: 10 mg via INTRAVENOUS

## 2019-09-30 MED ORDER — SODIUM CHLORIDE 0.9% FLUSH
3.0000 mL | Freq: Two times a day (BID) | INTRAVENOUS | Status: DC
  Administered 2019-10-01: 3 mL via INTRAVENOUS

## 2019-09-30 MED ORDER — HEPARIN (PORCINE) IN NACL 1000-0.9 UT/500ML-% IV SOLN
INTRAVENOUS | Status: AC
  Filled 2019-09-30: qty 1000

## 2019-09-30 MED ORDER — HYDRALAZINE HCL 20 MG/ML IJ SOLN
INTRAMUSCULAR | Status: AC
  Filled 2019-09-30: qty 1

## 2019-09-30 MED ORDER — APIXABAN 5 MG PO TABS: 5.0000 mg | ORAL_TABLET | Freq: Two times a day (BID) | ORAL | Status: DC

## 2019-09-30 MED ORDER — SODIUM CHLORIDE 0.9 % IV SOLN: 250.0000 mL | INTRAVENOUS | Status: DC | PRN

## 2019-09-30 MED ORDER — LIDOCAINE HCL (PF) 1 % IJ SOLN
INTRAMUSCULAR | Status: AC
  Filled 2019-09-30: qty 30

## 2019-09-30 MED ORDER — FENTANYL CITRATE (PF) 100 MCG/2ML IJ SOLN
INTRAMUSCULAR | Status: DC | PRN
  Administered 2019-09-30 (×2): 25 ug via INTRAVENOUS

## 2019-09-30 MED ORDER — HEPARIN SODIUM (PORCINE) 1000 UNIT/ML IJ SOLN
INTRAMUSCULAR | Status: AC
  Filled 2019-09-30: qty 1

## 2019-09-30 MED ORDER — HEPARIN (PORCINE) IN NACL 1000-0.9 UT/500ML-% IV SOLN
INTRAVENOUS | Status: DC | PRN
  Administered 2019-09-30 (×2): 500 mL

## 2019-09-30 MED ORDER — VERAPAMIL HCL 2.5 MG/ML IV SOLN
INTRAVENOUS | Status: DC | PRN
  Administered 2019-09-30: 10 mL via INTRA_ARTERIAL

## 2019-09-30 MED ORDER — VERAPAMIL HCL 2.5 MG/ML IV SOLN
INTRAVENOUS | Status: AC
  Filled 2019-09-30: qty 2

## 2019-09-30 SURGICAL SUPPLY — 16 items
CATH 5FR JL3.5 JR4 ANG PIG MP (CATHETERS) ×1 IMPLANT
CATH INFINITI 5FR AL1 (CATHETERS) ×1 IMPLANT
CATH SWAN GANZ 7F STRAIGHT (CATHETERS) ×1 IMPLANT
CLOSURE MYNX CONTROL 5F (Vascular Products) ×1 IMPLANT
DEVICE RAD COMP TR BAND LRG (VASCULAR PRODUCTS) ×1 IMPLANT
GLIDESHEATH SLEND SS 6F .021 (SHEATH) ×1 IMPLANT
GUIDEWIRE INQWIRE 1.5J.035X260 (WIRE) IMPLANT
INQWIRE 1.5J .035X260CM (WIRE) ×2
KIT HEART LEFT (KITS) ×2 IMPLANT
PACK CARDIAC CATHETERIZATION (CUSTOM PROCEDURE TRAY) ×2 IMPLANT
SHEATH PINNACLE 5F 10CM (SHEATH) ×1 IMPLANT
SHEATH PINNACLE 7F 10CM (SHEATH) ×1 IMPLANT
SHEATH PROBE COVER 6X72 (BAG) ×1 IMPLANT
TRANSDUCER W/STOPCOCK (MISCELLANEOUS) ×2 IMPLANT
TUBING CIL FLEX 10 FLL-RA (TUBING) ×2 IMPLANT
WIRE EMERALD 3MM-J .035X150CM (WIRE) ×1 IMPLANT

## 2019-09-30 NOTE — Progress Notes (Signed)
Physical Therapy Treatment Patient Details Name: Logan Robertson MRN: [id number] DOB: 11/03/1955 Today's Date: 09/30/2019    History of Present Illness 64 yo male with onset of SOB and chest tightness was admitted, had feeling it was from not using bipap 10 days.  MD notes new onset a-flutter and CHF, low K+.  PMHx:  OSA, CAD, lyme disease, CKD 3a, cardiomyopathy, CVA, MI    PT Comments    Pt sitting in bed on entry, eager to get out of bed and very happy with the encouraging results of his heart catheterization. Pt states that his high BP can be managed with medication. Pt continues to have slight dizziness throughout session, however he reports this as normal. BP prior to ambulation 173/102 and after ambulation 168/74. Pt is supervision for bed mobility, transfers and ambulation as he has tendency to get caught up in his tele cords. Pt will likely not need additional PT at discharge. PT will continue to follow acutely.     Follow Up Recommendations  Supervision for mobility/OOB;No PT follow up     Equipment Recommendations  None recommended by PT       Precautions / Restrictions Precautions Precautions: Fall Precaution Comments: monitor vitals with mobility esp BP Restrictions Weight Bearing Restrictions: No    Mobility  Bed Mobility Overal bed mobility: Needs Assistance Bed Mobility: Supine to Sit     Supine to sit: Supervision (for lines)        Transfers Overall transfer level: Needs assistance Equipment used: None Transfers: Sit to/from Stand Sit to Stand: Supervision         General transfer comment: supervision for lines   Ambulation/Gait Ambulation/Gait assistance: Supervision Gait Distance (Feet): 800 Feet Assistive device: None Gait Pattern/deviations: WFL(Within Functional Limits);Step-through pattern Gait velocity: WFL Gait velocity interpretation: >2.62 ft/sec, indicative of community ambulatory General Gait Details: supervision for safety and  management of telebox         Balance Overall balance assessment: Needs assistance Sitting-balance support: Feet supported Sitting balance-[name] Scale: Good     Standing balance support: No upper extremity supported Standing balance-[name] Scale: Good                              Cognition Arousal/Alertness: Awake/alert Behavior During Therapy: WFL for tasks assessed/performed Overall Cognitive Status: Within Functional Limits for tasks assessed                                           General Comments General comments (skin integrity, edema, etc.): BP in sitting before ambulation 173/102, after ambulation 168/74 max HR with ambulation 95bpm, SaO2 on RA 96%O2      Pertinent Vitals/Pain Pain Assessment: No/denies pain           PT Goals (current goals can now be found in the care plan section) Acute Rehab PT Goals Patient Stated Goal: to get going and feel better PT Goal Formulation: With patient Time For Goal Achievement: 10/06/19 Potential to Achieve Goals: Good Progress towards PT goals: Progressing toward goals    Frequency    Min 3X/week      PT Plan Current plan remains appropriate;Discharge plan needs to be updated       AM-PAC PT "6 Clicks" Mobility   Outcome Measure  Help needed turning from your back to your side while in a  flat bed without using bedrails?: None Help needed moving from lying on your back to sitting on the side of a flat bed without using bedrails?: None Help needed moving to and from a bed to a chair (including a wheelchair)?: None Help needed standing up from a chair using your arms (e.g., wheelchair or bedside chair)?: None Help needed to walk in hospital room?: None Help needed climbing 3-5 steps with a railing? : A Little 6 Click Score: 23    End of Session Equipment Utilized During Treatment: Gait belt Activity Tolerance: Patient tolerated treatment well Patient left: in bed;with call  bell/phone within reach;with nursing/sitter in room Nurse Communication: Mobility status PT Visit Diagnosis: Unsteadiness on feet (R26.81);Dizziness and giddiness (R42)     Time: 1155-2080 PT Time Calculation (min) (ACUTE ONLY): 26 min  Charges:  $Therapeutic Exercise: 23-37 mins                     Kathlen Sakurai B. Migdalia Dk PT, DPT Acute Rehabilitation Services Pager [phone] Office [phone]    [place] 09/30/2019, 5:08 PM

## 2019-09-30 NOTE — Progress Notes (Signed)
Progress Note  Patient Name: Logan Robertson Date of Encounter: 09/30/2019  [facility] Cardiologist: Buford Dresser, MD   Subjective   Reviewed results of cardiac cath today. No significant CAD, normal LVEDP. Feeling well overall. Blood pressure has been elevated today, did not drop significantly after ambulation.  Inpatient Medications    Scheduled Meds: . apixaban  5 mg Oral BID  . atorvastatin  80 mg Oral Daily  . hydrALAZINE  25 mg Oral TID  . irbesartan  300 mg Oral Daily  . isosorbide dinitrate  10 mg Oral TID  . metoprolol succinate  50 mg Oral Daily  . sodium chloride flush  3 mL Intravenous Once  . sodium chloride flush  3 mL Intravenous Q12H  . sodium chloride flush  3 mL Intravenous Q12H   Continuous Infusions: . sodium chloride     PRN Meds: sodium chloride, acetaminophen, hydrALAZINE, labetalol, ondansetron (ZOFRAN) IV, sodium chloride flush   Vital Signs    Vitals:   09/30/19 1050 09/30/19 1122 09/30/19 1237 09/30/19 1548  BP: (!) 172/74 (!) 157/87 139/68 (!) 170/142  Pulse: 65  (!) 56   Resp: 20  14   Temp: 98 F (36.7 C) (!) 97.5 F (36.4 C)  (!) 97.4 F (36.3 C)  TempSrc: Oral Oral  Oral  SpO2: 99%  99%   Weight:      Height:        Intake/Output Summary (Last 24 hours) at 09/30/2019 1556 Last data filed at 09/30/2019 0400 Gross per 24 hour  Intake 365.26 ml  Output 1050 ml  Net -684.74 ml   Last 3 Weights 09/30/2019 09/29/2019 09/28/2019  Weight (lbs) 278 lb 6.4 oz 280 lb 8 oz 279 lb 8 oz  Weight (kg) 126.281 kg 127.234 kg 126.78 kg      Telemetry    Including in cath lab, intermittent flutter and sinus rhythm - Personally Reviewed  ECG    09/30/19 SR with PACs - Personally Reviewed  Physical Exam   GEN: No acute distress.   Neck: No JVD Cardiac: RRR, no murmurs, rubs, or gallops.  Respiratory: Clear to auscultation bilaterally. GI: Soft, nontender, non-distended  MS: No edema; No deformity. Neuro:  Nonfocal  Psych:  Normal affect   Labs    High Sensitivity Troponin:   Recent Labs  Lab 09/28/19 1032 09/28/19 1255  TROPONINIHS 40* 32*      Chemistry Recent Labs  Lab 09/28/19 1032 09/29/19 0435 09/30/19 0610  NA 141 141 140  K 3.3* 3.2* 3.6  CL 106 107 107  CO2 26 24 24   GLUCOSE 114* 99 93  BUN 20 18 18   CREATININE 1.58* 1.41* 1.35*  CALCIUM 9.1 9.0 9.0  GFRNONAA 45* 52* 54*  GFRAA 52* 60* >60  ANIONGAP 9 10 9      Hematology Recent Labs  Lab 09/28/19 1032 09/29/19 0435 09/30/19 0351  WBC 11.8* 10.7* 9.3  RBC 4.43 4.34 4.17*  HGB 12.9* 12.8* 12.4*  HCT 37.3* 36.0* 34.8*  MCV 84.2 82.9 83.5  MCH 29.1 29.5 29.7  MCHC 34.6 35.6 35.6  RDW 14.4 14.2 14.1  PLT 319 287 271    BNP Recent Labs  Lab 09/28/19 1302  BNP 343.3*     DDimer  Recent Labs  Lab 09/28/19 1650  DDIMER 0.61*     Radiology    CARDIAC CATHETERIZATION  Result Date: 0/62/6948  LV end diastolic pressure is normal.  There is no aortic valve stenosis.  Hemodynamic findings consistent with  mild pulmonary hypertension.  Ao sat 97%, PA sat 69%, PA pressure 35/20, mean PA 26 mm Hg, mean PCWP 16 mm Hg; CO 6.9 L/min; CI 2.82  No angiographically apparent coronary artery disease.  Due to severe right subclavian tortuosity, we switched to the femoral approach. Would not attempt right radial approach in the future.  Continue medical therapy.  Patient with intermittent atrial flutter during the case.  He would convert back to NSR spontaneously, and once with IV metoprolol.   ECHOCARDIOGRAM COMPLETE  Result Date: 09/29/2019    ECHOCARDIOGRAM REPORT   Patient Name:   Logan Robertson Date of Exam: 09/29/2019 Medical Rec #:  [id number]      Height:       72.0 in Accession #:    [id number]     Weight:       280.5 lb Date of Birth:  [birth year]      BSA:          2.460 m Patient Age:    64 years       BP:           159/84 mmHg Patient Gender: M              HR:           74 bpm. Exam Location:  Inpatient Procedure: 2D  Echo, Cardiac Doppler and Color Doppler Indications:    I48.92* Unspecified atrial flutter  History:        Patient has prior history of Echocardiogram examinations and                 Patient has no prior history of Echocardiogram examinations.                 Previous Myocardial Infarction; Risk Factors:Hypertension and                 Sleep Apnea. Lyme Disease. Cancer.  Sonographer:    Jonelle Sidle Dance Referring Phys: [id number] [place]  1. Left ventricular ejection fraction, by estimation, is 35 to 40%. The left ventricle has moderately decreased function. The left ventricle demonstrates global hypokinesis. There is mild concentric left ventricular hypertrophy. Left ventricular diastolic function could not be evaluated.  2. Right ventricular systolic function is normal. The right ventricular size is normal. Tricuspid regurgitation signal is inadequate for assessing PA pressure.  3. The mitral valve is normal in structure. Moderate mitral annular calcification of the posterior mitral valve annulus .No evidence of mitral valve regurgitation. No evidence of mitral stenosis.  4. The aortic valve is tricuspid. Aortic valve regurgitation is moderate. Mild aortic valve sclerosis is present, with no evidence of aortic valve stenosis. Aortic regurgitation PHT measures 329 msec.  5. Aortic dilatation noted. There is mild dilatation at the level of the sinuses of Valsalva and severe dilatation of the ascending aorta measuring 43 mm and 45mm respectively.  6. The inferior vena cava is normal in size with greater than 50% respiratory variability, suggesting right atrial pressure of 3 mmHg.  7. Left atrial size was severely dilated. FINDINGS  Left Ventricle: Left ventricular ejection fraction, by estimation, is 35 to 40%. The left ventricle has moderately decreased function. The left ventricle demonstrates global hypokinesis. The left ventricular internal cavity size was normal in size. There is mild concentric  left ventricular hypertrophy. Left ventricular diastolic function could not be evaluated. Right Ventricle: The right ventricular size is normal. No increase in right ventricular wall thickness. Right ventricular systolic function is  normal. Tricuspid regurgitation signal is inadequate for assessing PA pressure. Left Atrium: Left atrial size was severely dilated. Right Atrium: Right atrial size was normal in size. Pericardium: Trivial pericardial effusion is present. The pericardial effusion is posterior to the left ventricle. Mitral Valve: The mitral valve is degenerative in appearance. There is mild calcification of the mitral valve leaflet(s). Normal mobility of the mitral valve leaflets. Moderate mitral annular calcification. Mild mitral valve regurgitation. No evidence of  mitral valve stenosis. Tricuspid Valve: The tricuspid valve is normal in structure. Tricuspid valve regurgitation is not demonstrated. No evidence of tricuspid stenosis. Aortic Valve: The aortic valve is tricuspid. Aortic valve regurgitation is moderate. Aortic regurgitation PHT measures 329 msec. Mild aortic valve sclerosis is present, with no evidence of aortic valve stenosis. Pulmonic Valve: The pulmonic valve was normal in structure. Pulmonic valve regurgitation is trivial. No evidence of pulmonic stenosis. Aorta: Aortic dilatation noted. There is mild dilatation at the level of the sinuses of Valsalva and of the ascending aorta measuring 43 mm. Venous: The inferior vena cava is normal in size with greater than 50% respiratory variability, suggesting right atrial pressure of 3 mmHg. IAS/Shunts: No atrial level shunt detected by color flow Doppler.  LEFT VENTRICLE PLAX 2D LVIDd:         5.20 cm LVIDs:         4.10 cm LV PW:         1.30 cm LV IVS:        1.20 cm LVOT diam:     2.30 cm LV SV:         65 LV SV Index:   27 LVOT Area:     4.15 cm  RIGHT VENTRICLE            IVC RV Basal diam:  2.60 cm    IVC diam: 1.90 cm RV S prime:     7.94  cm/s TAPSE (M-mode): 2.2 cm LEFT ATRIUM              Index       RIGHT ATRIUM           Index LA diam:        4.70 cm  1.91 cm/m  RA Area:     15.20 cm LA Vol (A2C):   124.0 ml 50.41 ml/m RA Volume:   31.30 ml  12.72 ml/m LA Vol (A4C):   113.0 ml 45.94 ml/m LA Biplane Vol: 119.0 ml 48.37 ml/m  AORTIC VALVE LVOT Vmax:   74.20 cm/s LVOT Vmean:  52.700 cm/s LVOT VTI:    0.157 m AI PHT:      329 msec  AORTA Ao Root diam: 4.30 cm Ao Asc diam:  5.20 cm MITRAL VALVE MV Area (PHT): 3.12 cm    SHUNTS MV Decel Time: 243 msec    Systemic VTI:  0.16 m MV E velocity: 84.80 cm/s  Systemic Diam: 2.30 cm MV A velocity: 61.30 cm/s MV E/A ratio:  1.38 Fransico Him MD Electronically signed by Fransico Him MD Signature Date/Time: 09/29/2019/12:28:57 PM    Final     Cardiac Studies   R/LHC 9.56.38  LV end diastolic pressure is normal.  There is no aortic valve stenosis.  Hemodynamic findings consistent with mild pulmonary hypertension.  Ao sat 97%, PA sat 69%, PA pressure 35/20, mean PA 26 mm Hg, mean PCWP 16 mm Hg; CO 6.9 L/min; CI 2.82  No angiographically apparent coronary artery disease.  Due to severe right subclavian tortuosity,  we switched to the femoral approach. Would not attempt right radial approach in the future.   Continue medical therapy.  Patient with intermittent atrial flutter during the case.  He would convert back to NSR spontaneously, and once with IV metoprolol.   Echo 09/29/19 1. Left ventricular ejection fraction, by estimation, is 35 to 40%. The  left ventricle has moderately decreased function. The left ventricle  demonstrates global hypokinesis. There is mild concentric left ventricular  hypertrophy. Left ventricular  diastolic function could not be evaluated.  2. Right ventricular systolic function is normal. The right ventricular  size is normal. Tricuspid regurgitation signal is inadequate for assessing  PA pressure.  3. The mitral valve is normal in structure. Moderate  mitral annular  calcification of the posterior mitral valve annulus .No evidence of mitral  valve regurgitation. No evidence of mitral stenosis.  4. The aortic valve is tricuspid. Aortic valve regurgitation is moderate.  Mild aortic valve sclerosis is present, with no evidence of aortic valve  stenosis. Aortic regurgitation PHT measures 329 msec.  5. Aortic dilatation noted. There is mild dilatation at the level of the  sinuses of Valsalva and severe dilatation of the ascending aorta measuring  43 mm and 53mm respectively.  6. The inferior vena cava is normal in size with greater than 50%  respiratory variability, suggesting right atrial pressure of 3 mmHg.  7. Left atrial size was severely dilated.    Patient Profile     64 y.o. male with a hx of cardiomyopathy, hyperlipidemia, hypertension, CAD with possible MI in 2008, history of Lyme disease, OSA on CPAP, CVA (although patient is unsure), prostate cancer who is being seen today for the evaluation of atrial flutter and cardiomyopathy  Assessment & Plan    New onset a flutter  -transition to apixaban post cath. Had femoral cath, will start tomorrow AM -CHA2DS2-VASc = 6 (CHF, age, hypertension, ?stroke x2, CAD)Keep K+ > 4 and mag > 2 -on metoprolol succinate 50 mg daily this admission, limited uptitration due to HR -has been paroxysmal  Acute on chronic combined systolic and diastolic heart failure: reportedly prior EF reduced, then normalized, and now low again -In the setting of new onset atrial flutter -Echo from 2018 showed EF >43% with diastolic dysfunction, reportedly EF improved form prior -R/LHC as above. Nonischemic cardiomyopathy. Normal LVEDP.  Cardiomyopathy, nonischemia -no significant CAD on cath. ? History of MI, not documented. -with borderline renal function and elevated BP, will start hydralazine/isordil today in addition to beta blocker and ARB -would like to transition to entresto. Will need to monitor  kidney function. Remote angioedema on ACEI but has tolerated ARB well for years without issue.  Hypertension -Patient was on amlodipine 10 mg, valsartan 320 mg daily, chlorthalidone, metoprolol 50 mg daily - ARB continued on admission, metoprolol succinate as above -starting hydralazine/isordil as above -considering entresto as above  HLD - atorvastatin 40 mg daily - LDL 134, HDL 42, TG 98  CKD stage III -watch renal function closely post cath.  OSA -CPAP  For questions or updates, please contact [place] Please consult [email or web address] for contact info under     Signed, Buford Dresser, MD  09/30/2019, 3:56 PM

## 2019-09-30 NOTE — TOC Benefit Eligibility Note (Signed)
Transition of Care [facility]) Benefit Eligibility Note    Patient Details  Name: Logan Robertson MRN: [id number] Date of Birth: [birth year]   Medication/Dose: Eliquis 5mg . bid  Covered?: Yes  Tier: 3 Drug  Prescription Coverage [facility]: [facility]  Spoke with Person/Company/Phone Number:: Per [place] [employer] PH#[phone]  Co-Pay: $47.00  Prior Approval: No  Deductible: Unmet       Shelda Altes Phone Number: 09/30/2019, 2:51 PM

## 2019-09-30 NOTE — Hospital Course (Addendum)
   ***   Follow up   - Recommend vascular follow up outpatient as patient has mild dilatation at the level of the sinuses of Valsalva and severe dilatation of the ascending aorta measuring  43 mm and 63mm respectively. [ ]  ABI results ***  - Consider starting SGLT-2, a1c 5.8 with known heart failure  [ ]  SGLT-2 for Heart Failure, CKD [ ]  f/u Dilated Aorta, ABIs (?PVD) outpt  [ ]  K check BMP  [ ]  Dry weight *** 124.9 kg (275 lbs)

## 2019-09-30 NOTE — Progress Notes (Signed)
[place] for heparin Indication: atrial fibrillation  Allergies  Allergen Reactions  . Lisinopril     Lip swelled up    Patient Measurements: Height: 6' (182.9 cm) Weight: 126.3 kg (278 lb 6.4 oz) IBW/kg (Calculated) : 77.6 Heparin Dosing Weight: 106 kg   Vital Signs: Temp: 98.7 F (37.1 C) (06/17 0317) Temp Source: Oral (06/17 0317) BP: 158/88 (06/17 0317) Pulse Rate: 74 (06/16 1646)  Labs: Recent Labs    09/28/19 1032 09/28/19 1032 09/28/19 1255 09/29/19 0009 09/29/19 0435 09/29/19 0919 09/30/19 0351  HGB 12.9*   < >  --   --  12.8*  --  12.4*  HCT 37.3*  --   --   --  36.0*  --  34.8*  PLT 319  --   --   --  287  --  271  APTT  --   --   --   --  88*  --   --   LABPROT  --   --   --   --  13.9  --   --   INR  --   --   --   --  1.1  --   --   HEPARINUNFRC  --   --   --  0.37  --  0.66 0.86*  CREATININE 1.58*  --   --   --  1.41*  --   --   TROPONINIHS 40*  --  32*  --   --   --   --    < > = values in this interval not displayed.    Estimated Creatinine Clearance: 70.8 mL/min (A) (by C-G formula based on SCr of 1.41 mg/dL (H)).  Assessment: 64 y.o. male with Afib for heparin  Goal of Therapy:  Heparin level 0.3-0.7 units/ml Monitor platelets by anticoagulation protocol: Yes   Plan:  Decrease Heparin 1500 units/hr Follow up after cath today   Phillis Knack, PharmD, BCPS  09/30/2019,4:24 AM

## 2019-09-30 NOTE — Interval H&P Note (Signed)
Cath Lab Visit (complete for each Cath Lab visit)  Clinical Evaluation Leading to the Procedure:   ACS: Yes.    Non-ACS:    Anginal Classification: CCS IV  Anti-ischemic medical therapy: Minimal Therapy (1 class of medications)  Non-Invasive Test Results: High-risk stress test findings: cardiac mortality >3%/year  low EF  Prior CABG: No previous CABG   Concern for prior inferior MI   History and Physical Interval Note:  09/30/2019 8:47 AM  Logan Robertson  has presented today for surgery, with the diagnosis of cardiomyopathy.  The various methods of treatment have been discussed with the patient and family. After consideration of risks, benefits and other options for treatment, the patient has consented to  Procedure(s): LEFT HEART CATH AND CORONARY ANGIOGRAPHY (N/A) as a surgical intervention.  The patient's history has been reviewed, patient examined, no change in status, stable for surgery.  I have reviewed the patient's chart and labs.  Questions were answered to the patient's satisfaction.     Larae Grooms

## 2019-09-30 NOTE — Progress Notes (Signed)
OT Cancellation Note  Patient Details Name: Logan Robertson MRN: [id number] DOB: [birth year]   Cancelled Treatment:    Reason Eval/Treat Not Completed: Patient at procedure or test/ unavailable pt currently off unit at [facility] CATH LAB. OT will return as time allows and pt is appropriate.   [facility] OTR/L Acute Rehabilitation Services Office: [place] 09/30/2019, 8:50 AM

## 2019-09-30 NOTE — Care Management (Signed)
Sent benefit check for Eliquis (apixaban) 5 mg twice daily x 30 days.   Changed pharmacy to [place] which will provide first 30 days free.   Magdalen Spatz RN

## 2019-09-30 NOTE — Progress Notes (Signed)
PT Cancellation Note  Patient Details Name: Logan Robertson MRN: [id number] DOB: [birth year]   Cancelled Treatment:    Reason Eval/Treat Not Completed: (P) Patient at procedure or test/unavailable Pt is off the floor in Cath Lab. PT will follow back this afternoon for Evaluation.  Ryelle Ruvalcaba B. Migdalia Dk PT, DPT Acute Rehabilitation Services Pager [phone] Office [phone]    [place] 09/30/2019, 9:09 AM

## 2019-09-30 NOTE — Progress Notes (Addendum)
Family Medicine Teaching Service Daily Progress Note Intern Pager: [phone]  Patient name: Logan Robertson Medical record number: [id number] Date of birth: 14-Apr-1956 Age: 64 y.o. Gender: male  Primary Care Provider: No primary care provider on file. Consultants: Cardiology  Code Status: Full  Pt Overview and Major Events to Date:  09/28/19: Admitted, cards consulted   Assessment and Plan:  Logan Robertson is a 64 y.o. malewho presented with dyspnea, fatigue, chest tightness, found to have new onset arrhythmia.  Additionally, patient is new to the area, recently moved here from [place]. PMH is significant for cardiomyopathy, adenocarcinoma of prostate, hypertension, hyperlipidemia, Lyme disease, MI (2008?), angioedema due to ACE-I, BPH, CKD, elevated PSA, CVA (2010), thyrotoxicosis?, OSA.   Atrial Flutter, new onset  Morning, patient in atrial flutter: Rate 130s, 4-1 block and he converted spontaneously to sinus rhythm. Cardiology evaluated patient discontinue the Cardizem drip and started him on metoprolol XL. Await additional audiology recommendations. Start DOAC after cath today. -Cardiology consulted, appreciate recommendations  -- 50 mg Toprol  -PT/INR, APTT -AM BMP and CBC -PT/OT: HH PT -Continuous pulse ox, goal over 92%   Prediabetes A1c 5.8 this admission. Glucose 93 this morning. -Consider starting Metformin vs SGLT-2   Lower extremity claudication Patient reporting walking 100 feet or needing to stop.  He has occasionally push through the pain.  Suspect peripheral artery disease. ABIs today at 11AM.  -Follow-up on ABIs -Consider vascular consult   Hypertension Hypertensive this morning 162/83. Patient with hx of uncontrolled HTN. Home medications include amlodipine 10, valsartan 320 mg daily, chlorthalidone, and metoprolol XL 50 mg daily. -Irbesartan 300 mg daily (valsartan is not formulary here) -Metoprolol succinate 50 mg 24 hours -  -Holding amlodipine,  metoprolol, chlorthalidone  Hypokalemia  Potassium 3.3 on admission. Potassium 3.6. -s/p 40 mEq Kdur x3 -AM BMP -Replete as necessary  Chronic Kidney Disease Stage IIIa Creatinine 1.58 and GFR 45, on admission. Today creatinine 1.35, BUN 18. -Monitor with BMP -Avoid nephrotoxic agents   Non-ischemic Cardiomyopathy  History of CAD?   Patient to have left heart cath today. EF 35 to 40% with global hypokinesis. Patient with hx of MI was in 2008 . Per cardiology notes from [facility], he has h/o non-ischemic cardiomyopathy secondary to uncontrolled hypertension.  Home meds: ASA 81mg  QD, Atorvastatin, Valsartan. -Cardiology consulted, appreciate recommendations -Holding home aspirin at this time -Follow up ECHO   Hyperlipidemia Patient currently prescribed atorvastatin 40 mg.  LDL on admission 134. -Continue atorvastatin to 80 mg atorvastatin  History of Lymes Disease Stable - PRN ibuprofen   History of PSA Discordant Intermediate risk Prostate Cancer Stable.   OSA  Patient notes he wears CPAP nightly. - CPAP qHS  FEN/GI: Heart healthy Prophylaxis: Heparin drip  Disposition: likely home pending completion on cardiac evaluation   Subjective:  Patient's monitor going off and is tachycardic to the 130s. Patient starts that he is short of breath with this interlude. Patient's girlfriend is present in the room.   Objective: Temp:  [98 F (36.7 C)-98.7 F (37.1 C)] 98.4 F (36.9 C) (06/17 0722) Pulse Rate:  [63-74] 63 (06/17 0757) Resp:  [15-20] 15 (06/17 0757) BP: (143-183)/(74-117) 162/80 (06/17 0757) SpO2:  [94 %-100 %] 100 % (06/17 0851) Weight:  [126.3 kg] 126.3 kg (06/17 0317)  Physical Exam: General: Resting in bed eating with his girlfriend, in no acute distress Cardiovascular: Irregularly irregular rhythm, 4:1 on monitor, HR130s Respiratory: Lungs clear, no increased work of breathing Abdomen: Soft, nontender, nondistended, obese  abdomen Extremities: No lower extremity edema appreciated  Laboratory: Recent Labs  Lab 09/28/19 1032 09/29/19 0435 09/30/19 0351  WBC 11.8* 10.7* 9.3  HGB 12.9* 12.8* 12.4*  HCT 37.3* 36.0* 34.8*  PLT 319 287 271   Recent Labs  Lab 09/28/19 1032 09/29/19 0435 09/30/19 0610  NA 141 141 140  K 3.3* 3.2* 3.6  CL 106 107 107  CO2 26 24 24   BUN 20 18 18   CREATININE 1.58* 1.41* 1.35*  CALCIUM 9.1 9.0 9.0  GLUCOSE 114* 99 93      Imaging/Diagnostic Tests: ECHOCARDIOGRAM COMPLETE  Result Date: 09/29/2019    ECHOCARDIOGRAM REPORT   Patient Name:   Logan Robertson Date of Exam: 09/29/2019 Medical Rec #:  [id number]      Height:       72.0 in Accession #:    [id number]     Weight:       280.5 lb Date of Birth:  1955-08-03      BSA:          2.460 m Patient Age:    64 years       BP:           159/84 mmHg Patient Gender: M              HR:           74 bpm. Exam Location:  Inpatient Procedure: 2D Echo, Cardiac Doppler and Color Doppler Indications:    I48.92* Unspecified atrial flutter  History:        Patient has prior history of Echocardiogram examinations and                 Patient has no prior history of Echocardiogram examinations.                 Previous Myocardial Infarction; Risk Factors:Hypertension and                 Sleep Apnea. Lyme Disease. Cancer.  Sonographer:    Jonelle Sidle Dance Referring Phys: [id number]  1. Left ventricular ejection fraction, by estimation, is 35 to 40%. The left ventricle has moderately decreased function. The left ventricle demonstrates global hypokinesis. There is mild concentric left ventricular hypertrophy. Left ventricular diastolic function could not be evaluated.  2. Right ventricular systolic function is normal. The right ventricular size is normal. Tricuspid regurgitation signal is inadequate for assessing PA pressure.  3. The mitral valve is normal in structure. Moderate mitral annular calcification of the posterior mitral valve  annulus .No evidence of mitral valve regurgitation. No evidence of mitral stenosis.  4. The aortic valve is tricuspid. Aortic valve regurgitation is moderate. Mild aortic valve sclerosis is present, with no evidence of aortic valve stenosis. Aortic regurgitation PHT measures 329 msec.  5. Aortic dilatation noted. There is mild dilatation at the level of the sinuses of Valsalva and severe dilatation of the ascending aorta measuring 43 mm and 59mm respectively.  6. The inferior vena cava is normal in size with greater than 50% respiratory variability, suggesting right atrial pressure of 3 mmHg.  7. Left atrial size was severely dilated. FINDINGS  Left Ventricle: Left ventricular ejection fraction, by estimation, is 35 to 40%. The left ventricle has moderately decreased function. The left ventricle demonstrates global hypokinesis. The left ventricular internal cavity size was normal in size. There is mild concentric left ventricular hypertrophy. Left ventricular diastolic function could not be evaluated. Right Ventricle: The right ventricular size is normal.  No increase in right ventricular wall thickness. Right ventricular systolic function is normal. Tricuspid regurgitation signal is inadequate for assessing PA pressure. Left Atrium: Left atrial size was severely dilated. Right Atrium: Right atrial size was normal in size. Pericardium: Trivial pericardial effusion is present. The pericardial effusion is posterior to the left ventricle. Mitral Valve: The mitral valve is degenerative in appearance. There is mild calcification of the mitral valve leaflet(s). Normal mobility of the mitral valve leaflets. Moderate mitral annular calcification. Mild mitral valve regurgitation. No evidence of  mitral valve stenosis. Tricuspid Valve: The tricuspid valve is normal in structure. Tricuspid valve regurgitation is not demonstrated. No evidence of tricuspid stenosis. Aortic Valve: The aortic valve is tricuspid. Aortic valve  regurgitation is moderate. Aortic regurgitation PHT measures 329 msec. Mild aortic valve sclerosis is present, with no evidence of aortic valve stenosis. Pulmonic Valve: The pulmonic valve was normal in structure. Pulmonic valve regurgitation is trivial. No evidence of pulmonic stenosis. Aorta: Aortic dilatation noted. There is mild dilatation at the level of the sinuses of Valsalva and of the ascending aorta measuring 43 mm. Venous: The inferior vena cava is normal in size with greater than 50% respiratory variability, suggesting right atrial pressure of 3 mmHg. IAS/Shunts: No atrial level shunt detected by color flow Doppler.  LEFT VENTRICLE PLAX 2D LVIDd:         5.20 cm LVIDs:         4.10 cm LV PW:         1.30 cm LV IVS:        1.20 cm LVOT diam:     2.30 cm LV SV:         65 LV SV Index:   27 LVOT Area:     4.15 cm  RIGHT VENTRICLE            IVC RV Basal diam:  2.60 cm    IVC diam: 1.90 cm RV S prime:     7.94 cm/s TAPSE (M-mode): 2.2 cm LEFT ATRIUM              Index       RIGHT ATRIUM           Index LA diam:        4.70 cm  1.91 cm/m  RA Area:     15.20 cm LA Vol (A2C):   124.0 ml 50.41 ml/m RA Volume:   31.30 ml  12.72 ml/m LA Vol (A4C):   113.0 ml 45.94 ml/m LA Biplane Vol: 119.0 ml 48.37 ml/m  AORTIC VALVE LVOT Vmax:   74.20 cm/s LVOT Vmean:  52.700 cm/s LVOT VTI:    0.157 m AI PHT:      329 msec  AORTA Ao Root diam: 4.30 cm Ao Asc diam:  5.20 cm MITRAL VALVE MV Area (PHT): 3.12 cm    SHUNTS MV Decel Time: 243 msec    Systemic VTI:  0.16 m MV E velocity: 84.80 cm/s  Systemic Diam: 2.30 cm MV A velocity: 61.30 cm/s MV E/A ratio:  1.38 Fransico Him MD Electronically signed by Fransico Him MD Signature Date/Time: 09/29/2019/12:28:57 PM    Final      Lyndee Hensen, DO 09/30/2019, 9:41 AM PGY-1, [place] Intern pager: [phone], text pages welcome

## 2019-10-01 ENCOUNTER — Inpatient Hospital Stay (HOSPITAL_COMMUNITY): Payer: Medicare HMO

## 2019-10-01 DIAGNOSIS — I10 Essential (primary) hypertension: Secondary | ICD-10-CM

## 2019-10-01 DIAGNOSIS — E785 Hyperlipidemia, unspecified: Secondary | ICD-10-CM

## 2019-10-01 DIAGNOSIS — I739 Peripheral vascular disease, unspecified: Secondary | ICD-10-CM

## 2019-10-01 LAB — BASIC METABOLIC PANEL
Anion gap: 8 (ref 5–15)
BUN: 18 mg/dL (ref 8–23)
CO2: 23 mmol/L (ref 22–32)
Calcium: 9.2 mg/dL (ref 8.9–10.3)
Chloride: 109 mmol/L (ref 98–111)
Creatinine, Ser: 1.32 mg/dL — ABNORMAL HIGH (ref 0.61–1.24)
GFR calc Af Amer: >60 mL/min (ref >60)
GFR calc non Af Amer: 56 mL/min — ABNORMAL LOW (ref >60)
Glucose, Bld: 93 mg/dL (ref 70–99)
Potassium: 3.2 mmol/L — ABNORMAL LOW (ref 3.5–5.1)
Sodium: 140 mmol/L (ref 135–145)

## 2019-10-01 LAB — HEPATIC FUNCTION PANEL
ALT: 13 U/L (ref 0–44)
AST: 15 U/L (ref 15–41)
Albumin: 3.3 g/dL — ABNORMAL LOW (ref 3.5–5.0)
Alkaline Phosphatase: 70 U/L (ref 38–126)
Bilirubin, Direct: 0.2 mg/dL (ref 0.0–0.2)
Indirect Bilirubin: 0.7 mg/dL (ref 0.3–0.9)
Total Bilirubin: 0.9 mg/dL (ref 0.3–1.2)
Total Protein: 7.1 g/dL (ref 6.5–8.1)

## 2019-10-01 LAB — CBC
HCT: 37.9 % — ABNORMAL LOW (ref 39.0–52.0)
Hemoglobin: 13.6 g/dL (ref 13.0–17.0)
MCH: 29.8 pg (ref 26.0–34.0)
MCHC: 35.9 g/dL (ref 30.0–36.0)
MCV: 82.9 fL (ref 80.0–100.0)
Platelets: 284 10*3/uL (ref 150–400)
RBC: 4.57 MIL/uL (ref 4.22–5.81)
RDW: 14.4 % (ref 11.5–15.5)
WBC: 9.9 10*3/uL (ref 4.0–10.5)
nRBC: 0 % (ref 0.0–0.2)

## 2019-10-01 MED ORDER — METOPROLOL SUCCINATE ER 50 MG PO TB24: 50.0000 mg | ORAL_TABLET | Freq: Every day | ORAL | 0 refills | Status: DC

## 2019-10-01 MED ORDER — ISOSORBIDE MONONITRATE ER 60 MG PO TB24: 60.0000 mg | ORAL_TABLET | Freq: Every day | ORAL | 0 refills | Status: DC

## 2019-10-01 MED ORDER — POTASSIUM CHLORIDE CRYS ER 20 MEQ PO TBCR: 40.0000 meq | EXTENDED_RELEASE_TABLET | Freq: Two times a day (BID) | ORAL | Status: DC

## 2019-10-01 MED ORDER — POTASSIUM CHLORIDE CRYS ER 20 MEQ PO TBCR
40.0000 meq | EXTENDED_RELEASE_TABLET | ORAL | Status: DC
  Administered 2019-10-01: 40 meq via ORAL
  Filled 2019-10-01: qty 2

## 2019-10-01 MED ORDER — HYDRALAZINE HCL 50 MG PO TABS: 50.0000 mg | ORAL_TABLET | Freq: Three times a day (TID) | ORAL | 0 refills | Status: DC

## 2019-10-01 MED ORDER — FUROSEMIDE 40 MG PO TABS
40.0000 mg | ORAL_TABLET | Freq: Every day | ORAL | Status: DC
  Administered 2019-10-01: 40 mg via ORAL
  Filled 2019-10-01: qty 1

## 2019-10-01 MED ORDER — HYDRALAZINE HCL 50 MG PO TABS: 50.0000 mg | ORAL_TABLET | Freq: Three times a day (TID) | ORAL | Status: DC

## 2019-10-01 MED ORDER — ISOSORBIDE MONONITRATE ER 60 MG PO TB24
60.0000 mg | ORAL_TABLET | Freq: Every day | ORAL | Status: DC
  Administered 2019-10-01: 60 mg via ORAL
  Filled 2019-10-01: qty 1

## 2019-10-01 MED ORDER — ATORVASTATIN CALCIUM 80 MG PO TABS: 40.0000 mg | ORAL_TABLET | Freq: Every day | ORAL | 0 refills | Status: DC

## 2019-10-01 MED ORDER — POTASSIUM CHLORIDE CRYS ER 20 MEQ PO TBCR: 20.0000 meq | EXTENDED_RELEASE_TABLET | Freq: Every day | ORAL | 0 refills | Status: DC

## 2019-10-01 MED ORDER — APIXABAN 5 MG PO TABS: 5.0000 mg | ORAL_TABLET | Freq: Two times a day (BID) | ORAL | 0 refills | Status: DC

## 2019-10-01 MED ORDER — FUROSEMIDE 40 MG PO TABS: 40.0000 mg | ORAL_TABLET | Freq: Every day | ORAL | 0 refills | Status: DC

## 2019-10-01 MED FILL — ATORVASTATIN CALCIUM 80 MG: 80 | 30 days supply | Qty: 30 | Fill #0

## 2019-10-01 MED FILL — ISOSORBIDE MN ER 60 MG TAB: 60 | 30 days supply | Qty: 30 | Fill #0

## 2019-10-01 MED FILL — FUROSEMIDE 40 MG TABLET: 40 | 30 days supply | Qty: 30 | Fill #0

## 2019-10-01 MED FILL — POTASSIUM CHLORIDE 20meqER: 20 | 30 days supply | Qty: 30 | Fill #0

## 2019-10-01 MED FILL — hydrALAZINE HCL 50 MG TABS: 50 | 30 days supply | Qty: 90 | Fill #0

## 2019-10-01 MED FILL — METOPROLOL SUCCINATE ER 50: 50 | 30 days supply | Qty: 30 | Fill #0

## 2019-10-01 MED FILL — ELIQUIS 5 MG TABLET: 5 | 30 days supply | Qty: 60 | Fill #0

## 2019-10-01 NOTE — Discharge Instructions (Signed)
Chest Wall Pain Chest wall pain is pain in or around the bones and muscles of your chest. Chest wall pain may be caused by:  An injury.  Coughing a lot.  Using your chest and arm muscles too much. Sometimes, the cause may not be known. This pain may take a few weeks or longer to get better. Follow these instructions at home: Managing pain, stiffness, and swelling If told, put ice on the painful area:  Put ice in a plastic bag.  Place a towel between your skin and the bag.  Leave the ice on for 20 minutes, 2-3 times a day.  Activity  Rest as told by your doctor.  Avoid doing things that cause pain. This includes lifting heavy items.  Ask your doctor what activities are safe for you. General instructions   Take over-the-counter and prescription medicines only as told by your doctor.  Do not use any products that contain nicotine or tobacco, such as cigarettes, e-cigarettes, and chewing tobacco. If you need help quitting, ask your doctor.  Keep all follow-up visits as told by your doctor. This is important. Contact a doctor if:  You have a fever.  Your chest pain gets worse.  You have new symptoms. Get help right away if:  You feel sick to your stomach (nauseous) or you throw up (vomit).  You feel sweaty or light-headed.  You have a cough with mucus from your lungs (sputum) or you cough up blood.  You are short of breath. These symptoms may be an emergency. Do not wait to see if the symptoms will go away. Get medical help right away. Call your local emergency services (911 in the [place] Do not drive yourself to the hospital. Summary  Chest wall pain is pain in or around the bones and muscles of your chest.  It may be treated with ice, rest, and medicines. Your condition may also get better if you avoid doing things that cause pain.  Contact a doctor if you have a fever, chest pain that gets worse, or new symptoms.  Get help right away if you feel  light-headed or you get short of breath. These symptoms may be an emergency. This information is not intended to replace advice given to you by your health care provider. Make sure you discuss any questions you have with your health care provider. Document Revised: 10/02/2017 Document Reviewed: 10/02/2017 Elsevier Patient Education  [place].   Heart Failure, Diagnosis  Heart failure means that your heart is not able to pump blood in the right way. This makes it hard for your body to work well. Heart failure is usually a long-term (chronic) condition. You must take good care of yourself and follow your treatment plan from your doctor. What are the causes? This condition may be caused by:  High blood pressure.  Build up of cholesterol and fat in the arteries.  Heart attack. This injures the heart muscle.  Heart valves that do not open and close properly.  Damage of the heart muscle. This is also called cardiomyopathy.  Lung disease.  Abnormal heart rhythms. What increases the risk? The risk of heart failure goes up as a person ages. This condition is also more likely to develop in people who:  Are overweight.  Are male.  Smoke or chew tobacco.  Abuse alcohol or illegal drugs.  Have taken medicines that can damage the heart.  Have diabetes.  Have abnormal heart rhythms.  Have thyroid problems.  Have  low blood counts (anemia). What are the signs or symptoms? Symptoms of this condition include:  Shortness of breath.  Coughing.  Swelling of the feet, ankles, legs, or belly.  Losing weight for no reason.  Trouble breathing.  Waking from sleep because of the need to sit up and get more air.  Rapid heartbeat.  Being very tired.  Feeling dizzy, or feeling like you may pass out (faint).  Having no desire to eat.  Feeling like you may vomit (nauseous).  Peeing (urinating) more at night.  Feeling confused. How is this treated?     This  condition may be treated with:  Medicines. These can be given to treat blood pressure and to make the heart muscles stronger.  Changes in your daily life. These may include eating a healthy diet, staying at a healthy body weight, quitting tobacco and illegal drug use, or doing exercises.  Surgery. Surgery can be done to open blocked valves, or to put devices in the heart, such as pacemakers.  A donor heart (heart transplant). You will receive a healthy heart from a donor. Follow these instructions at home:  Treat other conditions as told by your doctor. These may include high blood pressure, diabetes, thyroid disease, or abnormal heart rhythms.  Learn as much as you can about heart failure.  Get support as you need it.  Keep all follow-up visits as told by your doctor. This is important. Summary  Heart failure means that your heart is not able to pump blood in the right way.  This condition is caused by high blood pressure, heart attack, or damage of the heart muscle.  Symptoms of this condition include shortness of breath and swelling of the feet, ankles, legs, or belly. You may also feel very tired or feel like you may vomit.  You may be treated with medicines, surgery, or changes in your daily life.  Treat other health conditions as told by your doctor. This information is not intended to replace advice given to you by your health care provider. Make sure you discuss any questions you have with your health care provider. Document Revised: 06/19/2018 Document Reviewed: 06/19/2018 Elsevier Patient Education  [place] on my medicine - ELIQUIS (apixaban)  Why was Eliquis prescribed for you? Eliquis was prescribed for you to reduce the risk of a blood clot forming that can cause a stroke if you have a medical condition called atrial fibrillation (a type of irregular heartbeat).  What do You need to know about Eliquis ? Take your Eliquis TWICE DAILY -  one tablet in the morning and one tablet in the evening with or without food. If you have difficulty swallowing the tablet whole please discuss with your pharmacist how to take the medication safely.  Take Eliquis exactly as prescribed by your doctor and DO NOT stop taking Eliquis without talking to the doctor who prescribed the medication.  Stopping may increase your risk of developing a stroke.  Refill your prescription before you run out.  After discharge, you should have regular check-up appointments with your healthcare provider that is prescribing your Eliquis.  In the future your dose may need to be changed if your kidney function or weight changes by a significant amount or as you get older.  What do you do if you miss a dose? If you miss a dose, take it as soon as you remember on the same day and resume taking twice daily.  Do not take more than one  dose of ELIQUIS at the same time to make up a missed dose.  Important Safety Information A possible side effect of Eliquis is bleeding. You should call your healthcare provider right away if you experience any of the following: ? Bleeding from an injury or your nose that does not stop. ? Unusual colored urine (red or dark brown) or unusual colored stools (red or black). ? Unusual bruising for unknown reasons. ? A serious fall or if you hit your head (even if there is no bleeding).  Some medicines may interact with Eliquis and might increase your risk of bleeding or clotting while on Eliquis. To help avoid this, consult your healthcare provider or pharmacist prior to using any new prescription or non-prescription medications, including herbals, vitamins, non-steroidal anti-inflammatory drugs (NSAIDs) and supplements.  This website has more information on Eliquis (apixaban): http://www.eliquis.com/eliquis/home

## 2019-10-01 NOTE — Progress Notes (Signed)
Went to evaluate patient's groin access where heart cath insertion took place today.  Dressing was dry and intact on right radial as well as right inguinal.  Patient denied any bleeding.  Patient in no acute distress.  Continue to monitor for signs or symptoms of bleeding

## 2019-10-01 NOTE — Plan of Care (Signed)

## 2019-10-01 NOTE — Discharge Summary (Signed)
[facility] Discharge Summary  Patient name: Logan Robertson Medical record number: [id number] Date of birth: [birth year] Age: 64 y.o. Gender: male Date of Admission: 09/28/2019  Date of Discharge: 10/01/19 Admitting Physician: Kinnie Feil, MD  Primary Care Provider: No primary care provider on file. Consultants: Cardiology   Indication for Hospitalization: chest pain    Discharge Diagnoses/Problem List:  Atrial flutter  Prediabetes Hypertension Abnormal ABI Dilated ascending aorta Chronic kidney disease stage III a Nonischemic cardiomyopathy Hyperlipidemia Obstructive sleep apnea History of prostate cancer History of Lyme disease  Disposition: Home   Discharge Condition: Stable   Discharge Exam:  General: well appearing, in no acute distress  Cardiovascular: regular rate and rhythm, distal pulses intact, no murmurs appreciated Respiratory: Clear lungs bilaterally, no increased work of breathing Abdomen: Abdomen soft, nontender, obese, nondistended Extremities: No lower extremity edema appreciated, nontender Taken from Dr. Birdena Crandall progress note on the day of discharge   Brief Hospital Course:  [name] is a 64 y.o. male who presented with dyspnea, fatigue, chest tightness, found to have new onset arrhythmia.  Paroxsymal Atrial Flutter, new onset  Patient reported chest tightness initially started while lifting heavy objects  two days prior to arrival. Chest discomfort resoled with rest. ED EKG showed new atrial flutter with predominant 4-1 AV block. Patient converts back to NSR spontaneously.  With CHADs Vasc of 6, patient was started on Eliquis. Cardiology transitioned beta blocker to long acting Metoprolol 50 mg XL. Dose was limited secondary to patient's heart rate.  Acute on Chronic nonischemic Cardiomyopathy Chest x-ray showed mildly increased bibasilar interstitial markings consisent with mild edema. BNP was elevated at 343.  High-sensitivity troponin's were flat. ECHO this admission with EF 35-40%. Patient evaluated by cardiology and had left and right heart catheterization on 09/30/19.  Patient with mild pulmonary hypertension. No coronary artery disease was apparent. Patient started on imdur 60 mg daily and  Hydralazine 50 mg TID and is to resume valsartan upon discharge. Patient to start Lasix 40 mg daily. Dry weight about 275 lbs (125 kg).  Cardiology considering Enresto.   Abnormal ABI  Dilated ascending aorta Recommend vascular follow up outpatient as patient has mild dilatation at the level of the sinuses of Valsalva and severe dilatation of the ascending aorta measuring  43 mm and 19mm respectively. Patient complained of lower extremity pain with walking short distances (~172ft). ABI's obtained were significant for moderate bilateral LE disease.   Home medications were continued for other chronic diseases which remained stable.     Issues for Follow Up:   Patient will need vascular surgery referral for dilated ascending aorta and abnormal ABIs (see findings below)  Consider SGLT-2 or metformin for prediabetes with known heart failure.   Recheck BMP to reassess potassium and creatinine.    Reassess for bleeding as patient started apixaban.  Patient to follow up with [facility] for atrial flutter and nonischemic cardiomyopathy.   Significant Procedures: left/right heart cath, ABIs   Significant Labs and Imaging:  Recent Labs  Lab 09/29/19 0435 09/29/19 0435 09/30/19 0351 09/30/19 0351 09/30/19 0939 09/30/19 0946 10/01/19 0620  WBC 10.7*  --  9.3  --   --   --  9.9  HGB 12.8*   < > 12.4*   < > 12.6* 12.2* 13.6  HCT 36.0*   < > 34.8*   < > 37.0* 36.0* 37.9*  PLT 287  --  271  --   --   --  284   < > =  values in this interval not displayed.   Recent Labs  Lab 09/28/19 1032 09/28/19 1032 09/29/19 0435 09/29/19 0435 09/29/19 1031 09/30/19 0610 09/30/19 0610 09/30/19 0939 09/30/19 0939  09/30/19 0946 10/01/19 0620  NA 141   < > 141  --   --  140  --  143  --  143 140  K 3.3*   < > 3.2*   < >  --  3.6   < > 3.4*   < > 3.4* 3.2*  CL 106  --  107  --   --  107  --   --   --   --  109  CO2 26  --  24  --   --  24  --   --   --   --  23  GLUCOSE 114*  --  99  --   --  93  --   --   --   --  93  BUN 20  --  18  --   --  18  --   --   --   --  18  CREATININE 1.58*  --  1.41*  --   --  1.35*  --   --   --   --  1.32*  CALCIUM 9.1  --  9.0  --   --  9.0  --   --   --   --  9.2  MG  --   --   --   --  1.7  --   --   --   --   --   --   ALKPHOS  --   --   --   --   --   --   --   --   --   --  70  AST  --   --   --   --   --   --   --   --   --   --  15  ALT  --   --   --   --   --   --   --   --   --   --  13  ALBUMIN  --   --   --   --   --   --   --   --   --   --  3.3*   < > = values in this interval not displayed.    DG Chest 2 View  Result Date: 09/28/2019 CLINICAL DATA:  Chest pain, shortness of breath EXAM: CHEST - 2 VIEW COMPARISON:  None. FINDINGS: Heart size is mildly enlarged. Mildly increased bibasilar interstitial markings. No pleural effusion or pneumothorax. No acute osseous findings. IMPRESSION: 1. Mildly increased bibasilar interstitial markings, which may reflect mild edema versus atypical/viral infection. 2. Mild cardiomegaly. Electronically Signed   By: Davina Poke D.O.   On: 09/28/2019 10:46   ECHOCARDIOGRAM COMPLETE  Result Date: 09/29/2019    ECHOCARDIOGRAM REPORT   Patient Name:   Logan MAYSONET Date of Exam: 09/29/2019 Medical Rec #:  [id number]      Height:       72.0 in Accession #:    [id number]     Weight:       280.5 lb Date of Birth:  July 24, 1955      BSA:          2.460 m Patient Age:    64 years  BP:           159/84 mmHg Patient Gender: M              HR:           74 bpm. Exam Location:  Inpatient Procedure: 2D Echo, Cardiac Doppler and Color Doppler Indications:    I48.92* Unspecified atrial flutter  History:        Patient has prior history  of Echocardiogram examinations and                 Patient has no prior history of Echocardiogram examinations.                 Previous Myocardial Infarction; Risk Factors:Hypertension and                 Sleep Apnea. Lyme Disease. Cancer.  Sonographer:    Jonelle Sidle Dance Referring Phys: [id number]  1. Left ventricular ejection fraction, by estimation, is 35 to 40%. The left ventricle has moderately decreased function. The left ventricle demonstrates global hypokinesis. There is mild concentric left ventricular hypertrophy. Left ventricular diastolic function could not be evaluated.  2. Right ventricular systolic function is normal. The right ventricular size is normal. Tricuspid regurgitation signal is inadequate for assessing PA pressure.  3. The mitral valve is normal in structure. Moderate mitral annular calcification of the posterior mitral valve annulus .No evidence of mitral valve regurgitation. No evidence of mitral stenosis.  4. The aortic valve is tricuspid. Aortic valve regurgitation is moderate. Mild aortic valve sclerosis is present, with no evidence of aortic valve stenosis. Aortic regurgitation PHT measures 329 msec.  5. Aortic dilatation noted. There is mild dilatation at the level of the sinuses of Valsalva and severe dilatation of the ascending aorta measuring 43 mm and 15mm respectively.  6. The inferior vena cava is normal in size with greater than 50% respiratory variability, suggesting right atrial pressure of 3 mmHg.  7. Left atrial size was severely dilated. FINDINGS  Left Ventricle: Left ventricular ejection fraction, by estimation, is 35 to 40%. The left ventricle has moderately decreased function. The left ventricle demonstrates global hypokinesis. The left ventricular internal cavity size was normal in size. There is mild concentric left ventricular hypertrophy. Left ventricular diastolic function could not be evaluated. Right Ventricle: The right ventricular size is  normal. No increase in right ventricular wall thickness. Right ventricular systolic function is normal. Tricuspid regurgitation signal is inadequate for assessing PA pressure. Left Atrium: Left atrial size was severely dilated. Right Atrium: Right atrial size was normal in size. Pericardium: Trivial pericardial effusion is present. The pericardial effusion is posterior to the left ventricle. Mitral Valve: The mitral valve is degenerative in appearance. There is mild calcification of the mitral valve leaflet(s). Normal mobility of the mitral valve leaflets. Moderate mitral annular calcification. Mild mitral valve regurgitation. No evidence of  mitral valve stenosis. Tricuspid Valve: The tricuspid valve is normal in structure. Tricuspid valve regurgitation is not demonstrated. No evidence of tricuspid stenosis. Aortic Valve: The aortic valve is tricuspid. Aortic valve regurgitation is moderate. Aortic regurgitation PHT measures 329 msec. Mild aortic valve sclerosis is present, with no evidence of aortic valve stenosis. Pulmonic Valve: The pulmonic valve was normal in structure. Pulmonic valve regurgitation is trivial. No evidence of pulmonic stenosis. Aorta: Aortic dilatation noted. There is mild dilatation at the level of the sinuses of Valsalva and of the ascending aorta measuring 43 mm. Venous: The inferior vena cava  is normal in size with greater than 50% respiratory variability, suggesting right atrial pressure of 3 mmHg. IAS/Shunts: No atrial level shunt detected by color flow Doppler.  LEFT VENTRICLE PLAX 2D LVIDd:         5.20 cm LVIDs:         4.10 cm LV PW:         1.30 cm LV IVS:        1.20 cm LVOT diam:     2.30 cm LV SV:         65 LV SV Index:   27 LVOT Area:     4.15 cm  RIGHT VENTRICLE            IVC RV Basal diam:  2.60 cm    IVC diam: 1.90 cm RV S prime:     7.94 cm/s TAPSE (M-mode): 2.2 cm LEFT ATRIUM              Index       RIGHT ATRIUM           Index LA diam:        4.70 cm  1.91 cm/m  RA  Area:     15.20 cm LA Vol (A2C):   124.0 ml 50.41 ml/m RA Volume:   31.30 ml  12.72 ml/m LA Vol (A4C):   113.0 ml 45.94 ml/m LA Biplane Vol: 119.0 ml 48.37 ml/m  AORTIC VALVE LVOT Vmax:   74.20 cm/s LVOT Vmean:  52.700 cm/s LVOT VTI:    0.157 m AI PHT:      329 msec  AORTA Ao Root diam: 4.30 cm Ao Asc diam:  5.20 cm MITRAL VALVE MV Area (PHT): 3.12 cm    SHUNTS MV Decel Time: 243 msec    Systemic VTI:  0.16 m MV E velocity: 84.80 cm/s  Systemic Diam: 2.30 cm MV A velocity: 61.30 cm/s MV E/A ratio:  1.38 Fransico Him MD Electronically signed by Fransico Him MD Signature Date/Time: 09/29/2019/12:28:57 PM    Final    LOWER EXTREMITY DOPPLER STUDY  Indications: Claudication.  High Risk Factors: Hypertension.  Comparison Study: none  Performing Technologist: June Leap Rdms, Rvt  Examination Guidelines: A complete evaluation includes at minimum, Doppler  waveform signals and systolic blood pressure reading at the level of  bilateral  brachial, anterior tibial, and posterior tibial arteries, when vessel  segments  are accessible. Bilateral testing is considered an integral part of a  complete  examination. Photoelectric Plethysmograph (PPG) waveforms and toe systolic  pressure readings are included as required and additional duplex testing as needed. Limited examinations for reoccurring indications may be performed as noted.   ABI Findings:  +---------+------------------+-----+----------+--------+  Right  Rt Pressure (mmHg)IndexWaveform Comment   +---------+------------------+-----+----------+--------+  Brachial 190           triphasic       +---------+------------------+-----+----------+--------+  ATA   138        0.73 biphasic       +---------+------------------+-----+----------+--------+  PTA   138        0.73 monophasic      +---------+------------------+-----+----------+--------+  Great Toe116        0.61  Abnormal       +---------+------------------+-----+----------+--------+   +---------+------------------+-----+---------+-------+  Left   Lt Pressure (mmHg)IndexWaveform Comment  +---------+------------------+-----+---------+-------+  Brachial 184           triphasic      +---------+------------------+-----+---------+-------+  ATA   130        0.68  biphasic       +---------+------------------+-----+---------+-------+  PTA   129        0.68 biphasic       +---------+------------------+-----+---------+-------+  Great Toe117        0.62 Abnormal       +---------+------------------+-----+---------+-------+    Summary:  Right: Resting right ankle-brachial index indicates moderate right lower  extremity arterial disease. The right toe-brachial index is abnormal.   Left: Resting left ankle-brachial index indicates moderate left lower  extremity arterial disease. The left toe-brachial index is abnormal.   *See table(s) above for measurements and observations.   Electronically signed by Servando Snare MD on 10/01/2019 at 4:29:12 PM.    Results/Tests Pending at Time of Discharge:   Discharge Medications:  Allergies as of 10/01/2019      Reactions   Lisinopril Swelling   Angioedema      Medication List    STOP taking these medications   amLODipine 10 MG tablet Commonly known as: NORVASC   chlorthalidone 25 MG tablet Commonly known as: HYGROTON   ibuprofen 200 MG tablet Commonly known as: ADVIL   metoprolol tartrate 50 MG tablet Commonly known as: LOPRESSOR     TAKE these medications   acetaminophen 325 MG tablet Commonly known as: TYLENOL Take 650 mg by mouth every 6 (six) hours as needed for moderate pain.   apixaban 5 MG Tabs tablet Commonly known as: ELIQUIS Take 1 tablet (5 mg total) by mouth 2 (two) times daily.   atorvastatin 80 MG tablet Commonly known as: LIPITOR Take 0.5  tablets (40 mg total) by mouth daily. What changed: medication strength   furosemide 40 MG tablet Commonly known as: LASIX Take 1 tablet (40 mg total) by mouth daily. Start taking on: October 02, 2019   hydrALAZINE 50 MG tablet Commonly known as: APRESOLINE Take 1 tablet (50 mg total) by mouth 3 (three) times daily.   isosorbide mononitrate 60 MG 24 hr tablet Commonly known as: IMDUR Take 1 tablet (60 mg total) by mouth daily. Start taking on: October 02, 2019   metoprolol succinate 50 MG 24 hr tablet Commonly known as: TOPROL-XL Take 1 tablet (50 mg total) by mouth daily. Take with or immediately following a meal. Start taking on: October 02, 2019   potassium chloride SA 20 MEQ tablet Commonly known as: KLOR-CON Take 1 tablet (20 mEq total) by mouth daily.   valsartan 320 MG tablet Commonly known as: DIOVAN Take 320 mg by mouth daily.       Discharge Instructions: Please refer to Patient Instructions section of EMR for full details.  Patient was counseled important signs and symptoms that should prompt return to medical care, changes in medications, dietary instructions, activity restrictions, and follow up appointments.   Follow-Up Appointments:   Lyndee Hensen, DO 10/01/2019, 12:01 PM PGY-1 [place]

## 2019-10-01 NOTE — Progress Notes (Signed)
ABI       has been completed. Preliminary results can be found under CV proc through chart review. Ayeza Therriault, BS, RDMS, RVT   

## 2019-10-01 NOTE — Progress Notes (Addendum)
FMTS Attending Daily Note: Logan Singh, MD  Team Pager [phone] Pager [phone]  I have seen and examined this patient, reviewed their chart. I have discussed this patient with the resident. I agree with the resident's findings, assessment and care plan.   Blood pressure and glucose control will need optimized as oupatient. Addended note below. Medications listed below for discharge  Consider [facility] consultation given ABI.   Repeat BMP as outpatient.    Current Outpatient Medications:  .  acetaminophen (TYLENOL) 325 MG tablet, Take 650 mg by mouth every 6 (six) hours as needed for moderate pain., Disp: , Rfl:  .  valsartan (DIOVAN) 320 MG tablet, Take 320 mg by mouth daily., Disp: , Rfl:  .  apixaban (ELIQUIS) 5 MG TABS tablet, Take 1 tablet (5 mg total) by mouth 2 (two) times daily., Disp: 60 tablet, Rfl: 0 .  atorvastatin (LIPITOR) 80 MG tablet, Take 0.5 tablets (40 mg total) by mouth daily., Disp: 30 tablet, Rfl: 0 .  [START ON 10/02/2019] furosemide (LASIX) 40 MG tablet, Take 1 tablet (40 mg total) by mouth daily., Disp: 30 tablet, Rfl: 0 .  hydrALAZINE (APRESOLINE) 50 MG tablet, Take 1 tablet (50 mg total) by mouth 3 (three) times daily., Disp: 90 tablet, Rfl: 0 .  [START ON 10/02/2019] isosorbide mononitrate (IMDUR) 60 MG 24 hr tablet, Take 1 tablet (60 mg total) by mouth daily., Disp: 30 tablet, Rfl: 0 .  [START ON 10/02/2019] metoprolol succinate (TOPROL-XL) 50 MG 24 hr tablet, Take 1 tablet (50 mg total) by mouth daily. Take with or immediately following a meal., Disp: 30 tablet, Rfl: 0 .  potassium chloride SA (KLOR-CON) 20 MEQ tablet, Take 1 tablet (20 mEq total) by mouth daily., Disp: 30 tablet, Rfl: 0     Family Medicine Teaching Service Daily Progress Note Intern Pager: [phone]  Patient name: Logan Robertson Medical record number: [id number] Date of birth: [birth year] Age: 64 y.o. Gender: male  Primary Care Provider: No primary care provider on file. Consultants: Cardiology   Code Status: Full  Pt Overview and Major Events to Date:  09/28/19: Admitted, cards consulted   Assessment and Plan:  Logan Robertson is a 64 y.o. malewho presented with dyspnea, fatigue, chest tightness, found to have new onset arrhythmia.  Additionally, patient is new to the area, recently moved here from [place]. PMH is significant for cardiomyopathy, adenocarcinoma of prostate, hypertension, hyperlipidemia, Lyme disease, MI (2008?), angioedema due to ACE-I, BPH, CKD, elevated PSA, CVA (2010), thyrotoxicosis?, OSA.   Atrial Flutter, new onset  Regular rate and rhythm on exam this morning.  Unfortunately he was unhooked from the monitors on his way to get his ABIs.  Heart rate ranged 41-135 yesterday.  Patient on 50 mg Toprol.  Patient likely will need continued titration of this medication.  Await additional cardiology recommendations.  Will start Eliquis today. Likely able to discharge today.  -Cardiology consulted, appreciate recommendations  - Apixaban 5 mg BID - Metoprolol  -- 50 mg Toprol  -PT/INR, APTT -AM BMP and CBC -PT/OT: HH PT -Continuous pulse ox, goal over 92%   Prediabetes A1c 5.8 this admission. Glucose 93 this morning. -Consider starting Metformin vs SGLT-2  -Patient to [facility] with [facility]  Lower extremity claudication Patient heading down for ABIs this morning. Patient reporting walking 100 feet or needing to stop.  He has occasionally push through the pain.  Suspect peripheral artery disease.  -Follow-up on ABIs -Follow-up with vascular surgery outpatient   Hypertension Hypertensive this morning  162/83. Patient with hx of uncontrolled HTN. Home medications include amlodipine 10, valsartan 320 mg daily, chlorthalidone, and metoprolol XL 50 mg daily. -Irbesartan 300 mg daily (valsartan is not formulary here) -> Valsartan as oupatietn  -Metoprolol succinate 50 mg 24 hours -Holding amlodipine, metoprolol, chlorthalidone  Hypokalemia  Potassium 3.3  on admission. Potassium 3.2 today. Replete with Kdur 40 mEq x2 today.  -s/p 40 mEq Kdur x3 -AM BMP -Replete as necessary  Chronic Kidney Disease Stage IIIa Creatinine 1.58 and GFR 45, on admission. Today creatinine 1.32, BUN 18. -Monitor with BMP -Avoid nephrotoxic agents   Non-ischemic Cardiomyopathy   No coronary artery disease seen on left heart cath yesterday.  EF 35 to 40% with global hypokinesis. Home meds: ASA 81mg  QD, Atorvastatin, Valsartan.  Per cardiology, start hydralazine/Isordil. Recommend transitioning to long-acting therapy giving 3 times daily dosing of this medication. -Cardiology consulted, appreciate recommendations --40 mg Lasix p.o. on discharge -Holding home aspirin at this time --Hydralazine 50 mg TID and Imdur 60 mg daily  - Transition back to valsartan at discharge    Hyperlipidemia Patient currently prescribed atorvastatin 40 mg.  LDL on admission 134, TG 98.. -Continue atorvastatin 40 mg at discharge   History of Lymes Disease Stable - PRN ibuprofen for mild arthralgias  History of PSA Discordant Intermediate risk Prostate Cancer Stable.   OSA  Patient notes he wears CPAP nightly. - CPAP qHS  FEN/GI: Heart healthy Prophylaxis: Apixaban   Disposition: Likely home  Subjective:  Patient doing well.  Reports some intermittent shortness of breath.  Objective: Temp:  [97.4 F (36.3 C)-98.9 F (37.2 C)] 98.9 F (37.2 C) (06/18 0749) Pulse Rate:  [41-135] 67 (06/18 0749) Resp:  [12-38] 16 (06/18 0749) BP: (139-211)/(68-142) 173/97 (06/18 0749) SpO2:  [93 %-100 %] 99 % (06/18 0749) Weight:  [124.9 kg] 124.9 kg (06/18 0503)  Physical Exam: General: well appearing, in no acute distress  Cardiovascular: regular rate and rhythm, distal pulses intact, no murmurs appreciated Respiratory: Clear lungs bilaterally, no increased work of breathing Abdomen: Abdomen soft, nontender, obese, nondistended Extremities: No lower extremity edema  appreciated, nontender  Laboratory: Recent Labs  Lab 09/29/19 0435 09/29/19 0435 09/30/19 0351 09/30/19 0351 09/30/19 0939 09/30/19 0946 10/01/19 0620  WBC 10.7*  --  9.3  --   --   --  9.9  HGB 12.8*   < > 12.4*   < > 12.6* 12.2* 13.6  HCT 36.0*   < > 34.8*   < > 37.0* 36.0* 37.9*  PLT 287  --  271  --   --   --  284   < > = values in this interval not displayed.   Recent Labs  Lab 09/29/19 0435 09/29/19 0435 09/30/19 0610 09/30/19 0610 09/30/19 0939 09/30/19 0946 10/01/19 0620  NA 141   < > 140   < > 143 143 140  K 3.2*   < > 3.6   < > 3.4* 3.4* 3.2*  CL 107  --  107  --   --   --  109  CO2 24  --  24  --   --   --  23  BUN 18  --  18  --   --   --  18  CREATININE 1.41*  --  1.35*  --   --   --  1.32*  CALCIUM 9.0  --  9.0  --   --   --  9.2  PROT  --   --   --   --   --   --  7.1  BILITOT  --   --   --   --   --   --  0.9  ALKPHOS  --   --   --   --   --   --  70  ALT  --   --   --   --   --   --  13  AST  --   --   --   --   --   --  15  GLUCOSE 99  --  93  --   --   --  93   < > = values in this interval not displayed.      Imaging/Diagnostic Tests: CARDIAC CATHETERIZATION  Result Date: 9/84/2103  LV end diastolic pressure is normal.  There is no aortic valve stenosis.  Hemodynamic findings consistent with mild pulmonary hypertension.  Ao sat 97%, PA sat 69%, PA pressure 35/20, mean PA 26 mm Hg, mean PCWP 16 mm Hg; CO 6.9 L/min; CI 2.82  No angiographically apparent coronary artery disease.  Due to severe right subclavian tortuosity, we switched to the femoral approach. Would not attempt right radial approach in the future.  Continue medical therapy.  Patient with intermittent atrial flutter during the case.  He would convert back to NSR spontaneously, and once with IV metoprolol.     Lyndee Hensen, DO 10/01/2019, 8:26 AM PGY-1, [facility] Intern pager: [phone], text pages welcome

## 2019-10-01 NOTE — Progress Notes (Addendum)
Progress Note  Patient Name: Gen Clagg Date of Encounter: 10/01/2019  [facility] Cardiologist: Buford Dresser, MD   Subjective   Cath showed no significant CAD. Cath sites stable. Patient had a good night last night. No chest pain or sob. Patient is in and out of rate controlled alutter/fib, but mostly in sinus.   Inpatient Medications    Scheduled Meds: . apixaban  5 mg Oral BID  . atorvastatin  80 mg Oral Daily  . hydrALAZINE  25 mg Oral TID  . irbesartan  300 mg Oral Daily  . isosorbide dinitrate  10 mg Oral TID  . metoprolol succinate  50 mg Oral Daily  . sodium chloride flush  3 mL Intravenous Once  . sodium chloride flush  3 mL Intravenous Q12H  . sodium chloride flush  3 mL Intravenous Q12H   Continuous Infusions: . sodium chloride     PRN Meds: sodium chloride, acetaminophen, ondansetron (ZOFRAN) IV, sodium chloride flush   Vital Signs    Vitals:   09/30/19 2027 09/30/19 2326 10/01/19 0503 10/01/19 0749  BP: 139/81 (!) 171/89 (!) 162/91 (!) 173/97  Pulse: 87 79 81 67  Resp:  16 20 16   Temp: 98 F (36.7 C) 98.7 F (37.1 C) 97.9 F (36.6 C) 98.9 F (37.2 C)  TempSrc: Oral Oral Oral Oral  SpO2: 94% 97% 99% 99%  Weight:   124.9 kg   Height:        Intake/Output Summary (Last 24 hours) at 10/01/2019 0831 Last data filed at 09/30/2019 2325 Gross per 24 hour  Intake 360 ml  Output 250 ml  Net 110 ml   Last 3 Weights 10/01/2019 09/30/2019 09/29/2019  Weight (lbs) 275 lb 4.8 oz 278 lb 6.4 oz 280 lb 8 oz  Weight (kg) 124.875 kg 126.281 kg 127.234 kg      Telemetry    Sinus, in and out of aflutter/fib, heart rates mostly in the 60-70s, PVCs - Personally Reviewed  ECG    NSR, 70 bpm, nonspecific changes aVL, V5 and V6 - Personally Reviewed  Physical Exam   GEN: No acute distress.   Neck: No JVD Cardiac: RRR, no murmurs, rubs, or gallops.  Respiratory: Clear to auscultation bilaterally. GI: Soft, nontender, non-distended  MS: No  edema; No deformity. Neuro:  Nonfocal  Psych: Normal affect   Labs    High Sensitivity Troponin:   Recent Labs  Lab 09/28/19 1032 09/28/19 1255  TROPONINIHS 40* 32*      Chemistry Recent Labs  Lab 09/29/19 0435 09/29/19 0435 09/30/19 0610 09/30/19 0610 09/30/19 0939 09/30/19 0946 10/01/19 0620  NA 141   < > 140   < > 143 143 140  K 3.2*   < > 3.6   < > 3.4* 3.4* 3.2*  CL 107  --  107  --   --   --  109  CO2 24  --  24  --   --   --  23  GLUCOSE 99  --  93  --   --   --  93  BUN 18  --  18  --   --   --  18  CREATININE 1.41*  --  1.35*  --   --   --  1.32*  CALCIUM 9.0  --  9.0  --   --   --  9.2  PROT  --   --   --   --   --   --  7.1  ALBUMIN  --   --   --   --   --   --  3.3*  AST  --   --   --   --   --   --  15  ALT  --   --   --   --   --   --  13  ALKPHOS  --   --   --   --   --   --  70  BILITOT  --   --   --   --   --   --  0.9  GFRNONAA 52*  --  54*  --   --   --  56*  GFRAA 60*  --  >60  --   --   --  >60  ANIONGAP 10  --  9  --   --   --  8   < > = values in this interval not displayed.     Hematology Recent Labs  Lab 09/29/19 0435 09/29/19 0435 09/30/19 0351 09/30/19 0351 09/30/19 0939 09/30/19 0946 10/01/19 0620  WBC 10.7*  --  9.3  --   --   --  9.9  RBC 4.34  --  4.17*  --   --   --  4.57  HGB 12.8*   < > 12.4*   < > 12.6* 12.2* 13.6  HCT 36.0*   < > 34.8*   < > 37.0* 36.0* 37.9*  MCV 82.9  --  83.5  --   --   --  82.9  MCH 29.5  --  29.7  --   --   --  29.8  MCHC 35.6  --  35.6  --   --   --  35.9  RDW 14.2  --  14.1  --   --   --  14.4  PLT 287  --  271  --   --   --  284   < > = values in this interval not displayed.    BNP Recent Labs  Lab 09/28/19 1302  BNP 343.3*     DDimer  Recent Labs  Lab 09/28/19 1650  DDIMER 0.61*     Radiology    CARDIAC CATHETERIZATION  Result Date: 10/06/7626  LV end diastolic pressure is normal.  There is no aortic valve stenosis.  Hemodynamic findings consistent with mild pulmonary  hypertension.  Ao sat 97%, PA sat 69%, PA pressure 35/20, mean PA 26 mm Hg, mean PCWP 16 mm Hg; CO 6.9 L/min; CI 2.82  No angiographically apparent coronary artery disease.  Due to severe right subclavian tortuosity, we switched to the femoral approach. Would not attempt right radial approach in the future.  Continue medical therapy.  Patient with intermittent atrial flutter during the case.  He would convert back to NSR spontaneously, and once with IV metoprolol.   ECHOCARDIOGRAM COMPLETE  Result Date: 09/29/2019    ECHOCARDIOGRAM REPORT   Patient Name:   SUYASH AMORY Date of Exam: 09/29/2019 Medical Rec #:  [id number]      Height:       72.0 in Accession #:    [id number]     Weight:       280.5 lb Date of Birth:  [birth year]      BSA:          2.460 m Patient Age:    64 years       BP:  159/84 mmHg Patient Gender: M              HR:           74 bpm. Exam Location:  Inpatient Procedure: 2D Echo, Cardiac Doppler and Color Doppler Indications:    I48.92* Unspecified atrial flutter  History:        Patient has prior history of Echocardiogram examinations and                 Patient has no prior history of Echocardiogram examinations.                 Previous Myocardial Infarction; Risk Factors:Hypertension and                 Sleep Apnea. Lyme Disease. Cancer.  Sonographer:    Jonelle Sidle Dance Referring Phys: [id number] [place]  1. Left ventricular ejection fraction, by estimation, is 35 to 40%. The left ventricle has moderately decreased function. The left ventricle demonstrates global hypokinesis. There is mild concentric left ventricular hypertrophy. Left ventricular diastolic function could not be evaluated.  2. Right ventricular systolic function is normal. The right ventricular size is normal. Tricuspid regurgitation signal is inadequate for assessing PA pressure.  3. The mitral valve is normal in structure. Moderate mitral annular calcification of the posterior mitral valve annulus  .No evidence of mitral valve regurgitation. No evidence of mitral stenosis.  4. The aortic valve is tricuspid. Aortic valve regurgitation is moderate. Mild aortic valve sclerosis is present, with no evidence of aortic valve stenosis. Aortic regurgitation PHT measures 329 msec.  5. Aortic dilatation noted. There is mild dilatation at the level of the sinuses of Valsalva and severe dilatation of the ascending aorta measuring 43 mm and 47mm respectively.  6. The inferior vena cava is normal in size with greater than 50% respiratory variability, suggesting right atrial pressure of 3 mmHg.  7. Left atrial size was severely dilated. FINDINGS  Left Ventricle: Left ventricular ejection fraction, by estimation, is 35 to 40%. The left ventricle has moderately decreased function. The left ventricle demonstrates global hypokinesis. The left ventricular internal cavity size was normal in size. There is mild concentric left ventricular hypertrophy. Left ventricular diastolic function could not be evaluated. Right Ventricle: The right ventricular size is normal. No increase in right ventricular wall thickness. Right ventricular systolic function is normal. Tricuspid regurgitation signal is inadequate for assessing PA pressure. Left Atrium: Left atrial size was severely dilated. Right Atrium: Right atrial size was normal in size. Pericardium: Trivial pericardial effusion is present. The pericardial effusion is posterior to the left ventricle. Mitral Valve: The mitral valve is degenerative in appearance. There is mild calcification of the mitral valve leaflet(s). Normal mobility of the mitral valve leaflets. Moderate mitral annular calcification. Mild mitral valve regurgitation. No evidence of  mitral valve stenosis. Tricuspid Valve: The tricuspid valve is normal in structure. Tricuspid valve regurgitation is not demonstrated. No evidence of tricuspid stenosis. Aortic Valve: The aortic valve is tricuspid. Aortic valve regurgitation  is moderate. Aortic regurgitation PHT measures 329 msec. Mild aortic valve sclerosis is present, with no evidence of aortic valve stenosis. Pulmonic Valve: The pulmonic valve was normal in structure. Pulmonic valve regurgitation is trivial. No evidence of pulmonic stenosis. Aorta: Aortic dilatation noted. There is mild dilatation at the level of the sinuses of Valsalva and of the ascending aorta measuring 43 mm. Venous: The inferior vena cava is normal in size with greater than 50% respiratory variability, suggesting  right atrial pressure of 3 mmHg. IAS/Shunts: No atrial level shunt detected by color flow Doppler.  LEFT VENTRICLE PLAX 2D LVIDd:         5.20 cm LVIDs:         4.10 cm LV PW:         1.30 cm LV IVS:        1.20 cm LVOT diam:     2.30 cm LV SV:         65 LV SV Index:   27 LVOT Area:     4.15 cm  RIGHT VENTRICLE            IVC RV Basal diam:  2.60 cm    IVC diam: 1.90 cm RV S prime:     7.94 cm/s TAPSE (M-mode): 2.2 cm LEFT ATRIUM              Index       RIGHT ATRIUM           Index LA diam:        4.70 cm  1.91 cm/m  RA Area:     15.20 cm LA Vol (A2C):   124.0 ml 50.41 ml/m RA Volume:   31.30 ml  12.72 ml/m LA Vol (A4C):   113.0 ml 45.94 ml/m LA Biplane Vol: 119.0 ml 48.37 ml/m  AORTIC VALVE LVOT Vmax:   74.20 cm/s LVOT Vmean:  52.700 cm/s LVOT VTI:    0.157 m AI PHT:      329 msec  AORTA Ao Root diam: 4.30 cm Ao Asc diam:  5.20 cm MITRAL VALVE MV Area (PHT): 3.12 cm    SHUNTS MV Decel Time: 243 msec    Systemic VTI:  0.16 m MV E velocity: 84.80 cm/s  Systemic Diam: 2.30 cm MV A velocity: 61.30 cm/s MV E/A ratio:  1.38 Fransico Him MD Electronically signed by Fransico Him MD Signature Date/Time: 09/29/2019/12:28:57 PM    Final     Cardiac Studies   Cardiac cath 4/65/68  LV end diastolic pressure is normal.  There is no aortic valve stenosis.  Hemodynamic findings consistent with mild pulmonary hypertension.  Ao sat 97%, PA sat 69%, PA pressure 35/20, mean PA 26 mm Hg, mean PCWP 16  mm Hg; CO 6.9 L/min; CI 2.82  No angiographically apparent coronary artery disease.  Due to severe right subclavian tortuosity, we switched to the femoral approach. Would not attempt right radial approach in the future.   Continue medical therapy.  Patient with intermittent atrial flutter during the case.  He would convert back to NSR spontaneously, and once with IV metoprolol.   Echo 09/29/19 1. Left ventricular ejection fraction, by estimation, is 35 to 40%. The  left ventricle has moderately decreased function. The left ventricle  demonstrates global hypokinesis. There is mild concentric left ventricular  hypertrophy. Left ventricular  diastolic function could not be evaluated.  2. Right ventricular systolic function is normal. The right ventricular  size is normal. Tricuspid regurgitation signal is inadequate for assessing  PA pressure.  3. The mitral valve is normal in structure. Moderate mitral annular  calcification of the posterior mitral valve annulus .No evidence of mitral  valve regurgitation. No evidence of mitral stenosis.  4. The aortic valve is tricuspid. Aortic valve regurgitation is moderate.  Mild aortic valve sclerosis is present, with no evidence of aortic valve  stenosis. Aortic regurgitation PHT measures 329 msec.  5. Aortic dilatation noted. There is mild dilatation at the level  of the  sinuses of Valsalva and severe dilatation of the ascending aorta measuring  43 mm and 77mm respectively.  6. The inferior vena cava is normal in size with greater than 50%  respiratory variability, suggesting right atrial pressure of 3 mmHg.  7. Left atrial size was severely dilated.   Patient Profile     64 y.o. male with a hx of cardiomyopathy, hyperlipidemia, hypertension,CAD with possible MI in 2008,history of Lyme disease, OSA on CPAP, CVA(although patient is unsure), prostate cancerwho is being seen today for the evaluation ofatrial flutter and  cardiomyopathy.  Assessment & Plan    New onset a flutter  - Eliquis 5mg  BID started post cath -CHA2DS2-VASc= 6 (CHF, age, hypertension,?stroke x2,CAD) - Keep K+ >4 and mag >2 -on metoprolol succinate 50 mg daily this admission, limited uptitration due to HR -has been paroxysmal  Acute on chronic combined systolic and diastolic heart failure: reportedly prior EF reduced, then normalized, and now low again -In the setting of new onset atrial flutter -Echo from 2018showed EF >75% with diastolic dysfunction, reportedly EF improved form prior - Echo this admission showed decreased EF of 35-40% - Cath showed no significant CAD. Nonischemic cardiomyopathy. Normal LVEDP.  Cardiomyopathy, nonischemia -Cath as above. ? History of MI reported by pateint, not documented. -hydralazine/isordil started -Toprol 50 mg daily  -  Remote angioedema on ACEI but has tolerated ARB well for years without issue.   Hypertension -Patient was on amlodipine 10 mg, valsartan 320 mg daily, chlorthalidone, metoprolol 50 mg daily -ARB continued on admission, metoprolol succinate as above -starting hydralazine/isordil as above - Pressures elevated. Will discuss meds with MD.  HLD - atorvastatin 40 mg daily - LDL 134, HDL 42, TG 98  CKD stage III - renal function stable (1.32) closely post cath.  OSA -CPAP  For questions or updates, please contact [place] Please consult [email or web address] for contact info under        Signed, Cadence Ninfa Meeker, PA-C  10/01/2019, 8:31 AM    Addendum: Patient seen and examined, note reviewed with the signed Advanced Practice Provider. I personally reviewed laboratory data, imaging studies and relevant notes. I independently examined the patient and formulated the important aspects of the plan. I have personally discussed the plan with the patient and/or family. Comments or changes to the note/plan are indicated below.  Doing well. Blood pressure still  elevated, which he states has been the case for many years. Discussed options for management. He feels he can take a three times/day medication if needed.  GEN: Well nourished, well developed in no acute distress HEENT: Normal, moist mucous membranes NECK: No JVD CARDIAC: regular rhythm, normal S1 and S2, no rubs or gallops. No murmur. VASCULAR: Radial and DP pulses 2+ bilaterally. No carotid bruits RESPIRATORY:  Clear to auscultation without rales, wheezing or rhonchi  ABDOMEN: Soft, non-tender, non-distended MUSCULOSKELETAL:  Ambulates independently SKIN: Warm and dry, no edema NEUROLOGIC:  Alert and oriented x 3. No focal neuro deficits noted. PSYCHIATRIC:  Normal affect   Acute on chronic systolic and diastolic heart failure, nonischemic cardiomyopathy: -does not remember how intense the swelling was on lisinopril. Often we can get people to tolerate entresto if they tolerate an ARB, but with unclear severity of reaction, will hold on this for now -started nitrate/hydralazine yesterday. Consolidated to imdur today and will increase hydralazine. He feels he can do TID dosing at home.  -can restart valsartan (from irbesartan) at discharge -continue metoprolol  Atrial flutter: paroxysmal -chadsvasc 6,  continue apixaban and metoprolol  Hypertension: -will continue to uptitrate hydralazine/nitrate  Hypokalemia: being repleted today  CKD: Cr 1.32  Hyperlipidemia: return to 40 mg atorvastatin given no CAD on cath  [facility] will sign off.   Medication Recommendations:  Return to atorvastatin 40 mg at discharge Continue apixaban 5 mg BID Change chlorthalidone to lasix 40 mg daily Return to valsartan (from irbesartan) at discharge Started imdur 60 mg daily this admission, continue at discharge Started hydralazine 50 mg TID this admission, continue at discharge  Other recommendations (labs, testing, etc):  BMET Follow up as an outpatient:  We will arrange for post hospital  follow up with me or a member of my team.  Buford Dresser, MD, PhD [facility]  [address] [place], [place] [address] [phone]

## 2019-10-01 NOTE — Progress Notes (Signed)
Physical Therapy Treatment Patient Details Name: Logan Robertson MRN: [id number] DOB: 27-Aug-1955 Today's Date: 10/01/2019    History of Present Illness 64 yo male with onset of SOB and chest tightness was admitted, had feeling it was from not using bipap 10 days.  MD notes new onset a-flutter and CHF, low K+.  PMHx:  OSA, CAD, lyme disease, CKD 3a, cardiomyopathy, CVA, MI    PT Comments    Pt looking forward to discharge home today. PT to check BP response to mobility with new medication on board. Pt requires supervision for bed mobility, transfers and ambulation without AD. Pt BP was stable throughout session (see General Comments). D/c plans remain appropriate.     Follow Up Recommendations  Supervision for mobility/OOB;No PT follow up     Equipment Recommendations  None recommended by PT       Precautions / Restrictions Precautions Precautions: Fall Precaution Comments: monitor vitals with mobility esp BP    Mobility  Bed Mobility Overal bed mobility: Needs Assistance Bed Mobility: Supine to Sit     Supine to sit: Supervision (for lines)     General bed mobility comments: supervison for safety and line management  Transfers Overall transfer level: Needs assistance Equipment used: None Transfers: Sit to/from Stand Sit to Stand: Supervision         General transfer comment: supervision for lines   Ambulation/Gait Ambulation/Gait assistance: Modified independent (Device/Increase time) Gait Distance (Feet): 400 Feet Assistive device: None Gait Pattern/deviations: WFL(Within Functional Limits);Step-through pattern Gait velocity: WFL   General Gait Details: supervision for safety and management of telebox         Balance Overall balance assessment: Needs assistance Sitting-balance support: Feet supported Sitting balance-[name] Scale: Good     Standing balance support: No upper extremity supported Standing balance-[name] Scale: Good                               Cognition Arousal/Alertness: Awake/alert Behavior During Therapy: WFL for tasks assessed/performed Overall Cognitive Status: Within Functional Limits for tasks assessed                                           General Comments General comments (skin integrity, edema, etc.): BP in sitting 135/82, standing 121/73, and 138/88 after ambulation       Pertinent Vitals/Pain Pain Assessment: No/denies pain           PT Goals (current goals can now be found in the care plan section) Acute Rehab PT Goals Patient Stated Goal: to get going and feel better PT Goal Formulation: With patient Time For Goal Achievement: 10/06/19 Potential to Achieve Goals: Good Progress towards PT goals: Progressing toward goals    Frequency    Min 3X/week      PT Plan Current plan remains appropriate;Discharge plan needs to be updated       AM-PAC PT "6 Clicks" Mobility   Outcome Measure  Help needed turning from your back to your side while in a flat bed without using bedrails?: None Help needed moving from lying on your back to sitting on the side of a flat bed without using bedrails?: None Help needed moving to and from a bed to a chair (including a wheelchair)?: None Help needed standing up from a chair using your arms (e.g., wheelchair or bedside chair)?: None  Help needed to walk in hospital room?: None Help needed climbing 3-5 steps with a railing? : A Little 6 Click Score: 23    End of Session Equipment Utilized During Treatment: Gait belt Activity Tolerance: Patient tolerated treatment well Patient left: in bed;with call bell/phone within reach;with nursing/sitter in room Nurse Communication: Mobility status PT Visit Diagnosis: Unsteadiness on feet (R26.81);Dizziness and giddiness (R42)     Time: 2426-8341 PT Time Calculation (min) (ACUTE ONLY): 21 min  Charges:  $Gait Training: 8-22 mins                     Logan Robertson B. Migdalia Dk PT,  DPT Acute Rehabilitation Services Pager [phone] Office [phone]     [place] 10/01/2019, 3:53 PM

## 2019-10-01 NOTE — Evaluation (Signed)
Occupational Therapy Evaluation Patient Details Name: Logan Robertson MRN: [id number] DOB: 01/30/1956 Today's Date: 10/01/2019    History of Present Illness 64 yo male with onset of SOB and chest tightness was admitted, had feeling it was from not using bipap 10 days.  MD notes new onset a-flutter and CHF, low K+.  PMHx:  OSA, CAD, lyme disease, CKD 3a, cardiomyopathy, CVA, MI   Clinical Impression   PTA pt living independently for both BADL/IADL. At time of eval, pt demonstrated ability to complete BADL at mod I level of assist. He does endorse a tendency to fatigue quickly. Education given on ECS strategies to apply to BADL/IADL routines- pt in understanding. Educated pt on possible use of shower chair in walk in shower to also help conserve energy. Given current status, no further OT needs are necessary at this time. OT will sign off at this time, thank you for this consult.     Follow Up Recommendations  No OT follow up    Equipment Recommendations  None recommended by OT    Recommendations for Other Services       Precautions / Restrictions Precautions Precautions: Fall Restrictions Weight Bearing Restrictions: Yes RUE Weight Bearing: Partial weight bearing RUE Partial Weight Bearing Percentage or Pounds: 5 LB      Mobility Bed Mobility Overal bed mobility: Needs Assistance Bed Mobility: Supine to Sit     Supine to sit: Modified independent (Device/Increase time)        Transfers Overall transfer level: Needs assistance Equipment used: None Transfers: Sit to/from Stand Sit to Stand: Supervision              Balance Overall balance assessment: Needs assistance Sitting-balance support: Feet supported Sitting balance-[name] Scale: Good     Standing balance support: No upper extremity supported Standing balance-[name] Scale: Good                             ADL either performed or assessed with clinical judgement   ADL Overall ADL's : Modified  independent                                       General ADL Comments: Pt demonstrated ability to complete BADL at mod I level. Pt does require education and cues for ECS implementation for longer tasks     Vision Patient Visual Report: No change from baseline       Perception     Praxis      Pertinent Vitals/Pain Pain Assessment: No/denies pain     Hand Dominance     Extremity/Trunk Assessment Upper Extremity Assessment Upper Extremity Assessment: Overall WFL for tasks assessed   Lower Extremity Assessment Lower Extremity Assessment: Overall WFL for tasks assessed       Communication Communication Communication: No difficulties   Cognition Arousal/Alertness: Awake/alert Behavior During Therapy: WFL for tasks assessed/performed Overall Cognitive Status: Within Functional Limits for tasks assessed                                     General Comments       Exercises     Shoulder Instructions      Home Living Family/patient expects to be discharged to:: Private residence Living Arrangements: Spouse/significant other Available Help at Discharge: Family;Available 24  hours/day Type of Home: House Home Access: Stairs to enter [employer] of Steps: 2   Home Layout: One level     Bathroom Shower/Tub: Occupational psychologist: Standard     Home Equipment: None          Prior Functioning/Environment Level of Independence: Independent                 OT Problem List: Decreased knowledge of use of DME or AE;Decreased activity tolerance;Cardiopulmonary status limiting activity      OT Treatment/Interventions:      OT Goals(Current goals can be found in the care plan section) Acute Rehab OT Goals Patient Stated Goal: return to independence OT Goal Formulation: All assessment and education complete, DC therapy  OT Frequency:     Barriers to D/C:            Co-evaluation               AM-PAC OT "6 Clicks" Daily Activity     Outcome Measure Help from another person eating meals?: None Help from another person taking care of personal grooming?: None Help from another person toileting, which includes using toliet, bedpan, or urinal?: None Help from another person bathing (including washing, rinsing, drying)?: None Help from another person to put on and taking off regular upper body clothing?: None Help from another person to put on and taking off regular lower body clothing?: None 6 Click Score: 24   End of Session Nurse Communication: Mobility status  Activity Tolerance: Patient tolerated treatment well Patient left: in bed  OT Visit Diagnosis: Other abnormalities of gait and mobility (R26.89)                Time: 6834-1962 OT Time Calculation (min): 10 min Charges:  OT General Charges $OT Visit: 1 Visit OT Evaluation $OT Eval Low Complexity: 1 Low  Zenovia Jarred, MSOT, OTR/L Acute Rehabilitation Services [facility] Office Number: [phone] Pager: [phone]  Zenovia Jarred 10/01/2019, 1:16 PM

## 2019-10-06 ENCOUNTER — Encounter: Payer: Self-pay | Admitting: Family Medicine

## 2019-10-06 ENCOUNTER — Ambulatory Visit (INDEPENDENT_AMBULATORY_CARE_PROVIDER_SITE_OTHER): Payer: Medicare HMO | Admitting: Family Medicine

## 2019-10-06 ENCOUNTER — Other Ambulatory Visit: Payer: Self-pay

## 2019-10-06 VITALS — BP 158/82 | HR 82 | Ht 72.0 in | Wt 280.0 lb

## 2019-10-06 DIAGNOSIS — I10 Essential (primary) hypertension: Secondary | ICD-10-CM | POA: Diagnosis not present

## 2019-10-06 DIAGNOSIS — N1831 Chronic kidney disease, stage 3a: Secondary | ICD-10-CM

## 2019-10-06 DIAGNOSIS — I5020 Unspecified systolic (congestive) heart failure: Secondary | ICD-10-CM | POA: Diagnosis not present

## 2019-10-06 DIAGNOSIS — I428 Other cardiomyopathies: Secondary | ICD-10-CM | POA: Insufficient documentation

## 2019-10-06 DIAGNOSIS — N183 Chronic kidney disease, stage 3 unspecified: Secondary | ICD-10-CM | POA: Insufficient documentation

## 2019-10-06 DIAGNOSIS — I77810 Thoracic aortic ectasia: Secondary | ICD-10-CM | POA: Insufficient documentation

## 2019-10-06 DIAGNOSIS — I739 Peripheral vascular disease, unspecified: Secondary | ICD-10-CM | POA: Diagnosis not present

## 2019-10-06 DIAGNOSIS — I4892 Unspecified atrial flutter: Secondary | ICD-10-CM

## 2019-10-06 DIAGNOSIS — E785 Hyperlipidemia, unspecified: Secondary | ICD-10-CM

## 2019-10-06 MED ORDER — DAPAGLIFLOZIN PROPANEDIOL 10 MG PO TABS: 10.0000 mg | ORAL_TABLET | Freq: Every day | ORAL | 3 refills | Status: DC

## 2019-10-06 NOTE — Assessment & Plan Note (Addendum)
-   continue Atorvastatin 80mg  QD - consider repeat lipid panel in 3 months to assess effectiveness  - continue to recommend life style modifications

## 2019-10-06 NOTE — Assessment & Plan Note (Addendum)
-   referral to vascular surgery placed for further evaluation

## 2019-10-06 NOTE — Patient Instructions (Signed)
Thank you for coming to see me today. It was a pleasure to see you.   I have started a new medicine called [place].  You will need to take this pill once a day every day.  Please monitor your blood pressures twice a day and keep a log until you follow-up with cardiology on 10/20/2019. I have scheduled you a follow-up visit on 10/13/2019 for repeat labs and a blood pressure check.  Referred you to a vascular surgeon for further evaluation of your peripheral arterial disease and aorta dilation.  Please expect a phone call from them to schedule an appointment.  If you do not hear from anyone within 1 to 2 weeks, please contact me so I can look further into it.  Please monitor your weight daily and monitor for swelling.  If you gain more than 3 pounds overnight, worsening lower extremity swelling, or worsening shortness of breath please contact me or cardiology for further evaluation.  You may take an additional dose of the furosemide in the evening if you notice this.  We are checking some labs today, I will call you if they are abnormal will send you a MyChart message or a letter if they are normal.  If you do not hear about your labs in the next 2 weeks please let us know.  Please follow-up in 6 months.  If you have any questions or concerns, please do not hesitate to call the office at [phone].  Take Care,  Dr. Mina Marble, DO Resident Physician [place] [phone]

## 2019-10-06 NOTE — Progress Notes (Signed)
Subjective:   Patient ID: Logan Robertson    DOB: [birth year], 64 y.o. male   MRN: [id number]  Taji Barretto is a 64 y.o. male with a history of CAD, HTN, lymes diagnosis s/p treatment, OSA, new-onset atrial flutter and AoC systolic CHF here for hospital follow up. Patient just relocated from [place] to [facility].   New Onset Paroxysmal A-flutter: Patient presented to hospital with reported chest tightness and SOB. On admission, patient was noted to have new onset paroxsymal A-flutter with predominant 4-1 AV block. R/L heart cath negative for CAD. CHADVASC score of 6, patient was started on Heparin then transitioned to Eliquis. Patient was discharge on Metoprolol 50mg  XL. Today patient notes he does continue to get SOB with exertion (going up the stairs), but denies chest pain, diaphoresis, or other symptoms when this occurs. Denies symptoms with normal exertion or at rest.  Denies chest pain, SOB, palpitation, lightheadedness/dizziness, vision changes. Denies acute bleed, hematochezia, hematemesis, hematuria, melena.  PAD  Dilated Ascending Aorta: ABI's obtained during hospitalization in setting of endorsed claudication. Notable for bilateral moderate LE arterial disease. Additionally, echo notable for mild dilatation at the level of the sinuses of Valsalva and severe dilatation of the ascending aorta measuring  43 mm and 33mm respectively. He notes he gets claudication when he walks about 50 yards and worsens with further walking.  HTN:  BP: (!) 158/82 today. Repeat 150/87. Currently on Hydralazine 50mg  TID, Imdur 60mg  QD, Metoprolol 50mg  QD, and Valsartan 320mg  QD. Endorses compliance. Denies any chest pain, SOB, vision changes, or headaches.   HLD: Last lipid panel below. Currently on Atorvastatin 80mg  QD. Endorses compliance. Denies any muscles aches or weakness. LDL goal <70.  Lab Results  Component Value Date   CHOL 196 09/29/2019   HDL 42 09/29/2019   LDLCALC 134 (H) 09/29/2019   TRIG 98  09/29/2019   CHOLHDL 4.7 09/29/2019   Non-ischemic Cardiomyopathy  HFrEF: Echo from 09/29/19 notable for LV EF of 35-40% with global hypokinesis, mild concentric LV hypertrophy, normal RV, moderate posterior mitral annular calcification, moderate aortic valve regurg with mild sclerosis, and mild aortic dilatation. Patient is s/p R/L heart cath on 09/30/19 which was notable for mild pulmonary HTN, no CAD. Home meds: Imdur 60mg  QD, Hydralazine 60mg  TID, Valsartan 320mg  QD, Metoprolol 50mg  QD, and Lasix 40mg  QD. Dry weight 275lbs (125kg). Cardiology consulted during admission who considered Entresto. Denies chest pain, SOB, LE edema. Wt today 280lbs.   CKD IIIa: Creatinine at discharge noted to be 1.32.  - will obtain CMP today  Review of Systems:  Per HPI.   Objective:   BP (!) 158/82   Pulse 82   Ht 6' (1.829 m)   Wt 280 lb (127 kg)   SpO2 96%   BMI 37.97 kg/m  Vitals and nursing note reviewed.  General: pleasant older male, sitting comfortably in exam chair, well nourished, well developed, in no acute distress with non-toxic appearance HEENT: normocephalic, atraumatic, moist mucous membranes CV: regular rate and rhythm without murmurs, rubs, or gallops, no lower extremity edema, decreased pedal and posterior tibial pulses bilaterally Lungs: clear to auscultation bilaterally with normal work of breathing on room air Abdomen: soft, non-tender, non-distended, normoactive bowel sounds Skin: warm, dry Extremities: warm and well perfused Neuro: Alert and oriented, speech normal  Assessment & Plan:   Hyperlipidemia - continue Atorvastatin 80mg  QD - consider repeat lipid panel in 3 months to assess effectiveness  - continue to recommend life style modifications  Stage 3a  chronic kidney disease Last Cr 1.32 with GFR of 56. Cr 1.30 today. Kidney function stable. Electrolytes stable.  Ascending aorta dilatation (H[facility]) - referral to vascular surgery placed for further  evaluation  Paroxysmal atrial flutter ([place]) New onset. R/L heart cath negative for CAD. CHADVASC score of 6, patient currently on Eliquis. Tolerating well. No signs of acute bleed. Asymptomatic.  - continue Eliquis, Metoprolol as prescribed - patient to follow up with [facility] on 10/28/19  Non-ischemic cardiomyopathy ([place]) Asymptomatic. Continue medications: Imdur, Hydralazine, Valsartan, Metoprolol, and Lasix. Patient also on K-dur supplement daily. - CMP today to monitor potassium - Follow up with [facility] on 10/28/19 as scheduled  Heart failure with reduced ejection fraction ([place]) Asymptomatic without signs of fluid overload. Continue current medications: Imdur 60mg  QD, Hydralazine 60mg  TID, Valsartan 320mg  QD, Metoprolol 50mg  QD, and Lasix 40mg  QD. CMP with stable kidney function and normal electrolytes. - follow up with cardiology 10/28/19 as scheduled - RTC if develops SOB, chest pain, LE edema, or >3lb weight gain overnight - low salt diet - Start Farxiga 10mg  QD for HF benefit. F/u 10/13/19 for BMP  Peripheral arterial disease ([place]) - referral to vascular surgery placed for further evaluation  Essential hypertension Multiple elevated BP's in clinic today. Currently on Hydralazine 50mg  TID, Imdur 60mg  QD, Metoprolol 50mg  QD, and Valsartan 320mg  QD. Endorses compliance.  - start Farxiga 10mg  QD - monitor blood pressures 2x/day for the next weeks. - Follow up on 10/13/19 for nurses visit and lab visit for repeat BMP and blood pressure check. - Patient to contact me if blood pressures <110's or develops symptoms.   Orders Placed This Encounter  Procedures  . Comprehensive metabolic panel  . Basic Metabolic Panel    Standing Status:   Future    Standing Expiration Date:   10/05/2020  . Ambulatory referral to Vascular Surgery    Referral Priority:   Routine    Referral Type:   Surgical    Referral Reason:   Specialty Services Required    Requested Specialty:   Vascular  Surgery    Number of Visits Requested:   1   Meds ordered this encounter  Medications  . dapagliflozin propanediol (FARXIGA) 10 MG TABS tablet    Sig: Take 1 tablet (10 mg total) by mouth daily.    Dispense:  90 tablet    Refill:  [place], DO PGY-2, [facility] 10/10/2019 8:10 PM

## 2019-10-06 NOTE — Assessment & Plan Note (Addendum)
New onset. R/L heart cath negative for CAD. CHADVASC score of 6, patient currently on Eliquis. Tolerating well. No signs of acute bleed. Asymptomatic.  - continue Eliquis, Metoprolol as prescribed - patient to follow up with [facility] on 10/28/19

## 2019-10-06 NOTE — Assessment & Plan Note (Signed)
Asymptomatic. Continue medications: Imdur, Hydralazine, Valsartan, Metoprolol, and Lasix. Patient also on K-dur supplement daily. - CMP today to monitor potassium - Follow up with [facility] on 10/28/19 as scheduled

## 2019-10-06 NOTE — Assessment & Plan Note (Addendum)
Last Cr 1.32 with GFR of 56. Cr 1.30 today. Kidney function stable. Electrolytes stable.

## 2019-10-06 NOTE — Assessment & Plan Note (Addendum)
Asymptomatic without signs of fluid overload. Continue current medications: Imdur 60mg  QD, Hydralazine 60mg  TID, Valsartan 320mg  QD, Metoprolol 50mg  QD, and Lasix 40mg  QD. CMP with stable kidney function and normal electrolytes. - follow up with cardiology 10/28/19 as scheduled - RTC if develops SOB, chest pain, LE edema, or >3lb weight gain overnight - low salt diet - Start Farxiga 10mg  QD for HF benefit. F/u 10/13/19 for BMP

## 2019-10-07 LAB — COMPREHENSIVE METABOLIC PANEL
ALT: 26 IU/L (ref 0–44)
AST: 16 IU/L (ref 0–40)
Albumin/Globulin Ratio: 1.4 (ref 1.2–2.2)
Albumin: 4.2 g/dL (ref 3.8–4.8)
Alkaline Phosphatase: 99 IU/L (ref 48–121)
BUN/Creatinine Ratio: 14 (ref 10–24)
BUN: 18 mg/dL (ref 8–27)
Bilirubin Total: 0.4 mg/dL (ref 0.0–1.2)
CO2: 24 mmol/L (ref 20–29)
Calcium: 9.4 mg/dL (ref 8.6–10.2)
Chloride: 102 mmol/L (ref 96–106)
Creatinine, Ser: 1.3 mg/dL — ABNORMAL HIGH (ref 0.76–1.27)
GFR calc Af Amer: 66 mL/min/{1.73_m2} (ref >59)
GFR calc non Af Amer: 57 mL/min/{1.73_m2} — ABNORMAL LOW (ref >59)
Globulin, Total: 3.1 g/dL (ref 1.5–4.5)
Glucose: 82 mg/dL (ref 65–99)
Potassium: 4.4 mmol/L (ref 3.5–5.2)
Sodium: 139 mmol/L (ref 134–144)
Total Protein: 7.3 g/dL (ref 6.0–8.5)

## 2019-10-10 NOTE — Assessment & Plan Note (Signed)
Multiple elevated BP's in clinic today. Currently on Hydralazine 50mg  TID, Imdur 60mg  QD, Metoprolol 50mg  QD, and Valsartan 320mg  QD. Endorses compliance.  - start Farxiga 10mg  QD - monitor blood pressures 2x/day for the next weeks. - Follow up on 10/13/19 for nurses visit and lab visit for repeat BMP and blood pressure check. - Patient to contact me if blood pressures <110's or develops symptoms.

## 2019-10-13 ENCOUNTER — Other Ambulatory Visit: Payer: Medicare HMO

## 2019-10-13 ENCOUNTER — Ambulatory Visit: Payer: Medicare HMO

## 2019-10-13 ENCOUNTER — Ambulatory Visit (INDEPENDENT_AMBULATORY_CARE_PROVIDER_SITE_OTHER): Payer: Medicare HMO

## 2019-10-13 ENCOUNTER — Other Ambulatory Visit: Payer: Self-pay

## 2019-10-13 VITALS — BP 152/80 | HR 79

## 2019-10-13 DIAGNOSIS — Z0130 Encounter for examination of blood pressure without abnormal findings: Secondary | ICD-10-CM

## 2019-10-13 NOTE — Progress Notes (Signed)
Patient here today for BP check.      Last BP was on 10/06/2019 and was 158/82.  BP today is 152/80 with a pulse of 79. After sitting in room for 15 minutes BP rechecked: 146/74.   Checked BP in left arm with large adult cuff.    Symptoms present: none.   Precepted patient with Dr. Nori Riis. Per Dr. Nori Riis patient is to receive scheduled lab work today and follow up with PCP in 3-4 weeks. Patient scheduled with Dr. Tarry Kos for 7/21 at 1530.   Patient last took BP med this morning.  Routed note to PCP.      ED precautions given to patient.    Talbot Grumbling, RN

## 2019-10-14 LAB — BASIC METABOLIC PANEL
BUN/Creatinine Ratio: 13 (ref 10–24)
BUN: 17 mg/dL (ref 8–27)
CO2: 24 mmol/L (ref 20–29)
Calcium: 9.3 mg/dL (ref 8.6–10.2)
Chloride: 105 mmol/L (ref 96–106)
Creatinine, Ser: 1.28 mg/dL — ABNORMAL HIGH (ref 0.76–1.27)
GFR calc Af Amer: 67 mL/min/{1.73_m2} (ref >59)
GFR calc non Af Amer: 58 mL/min/{1.73_m2} — ABNORMAL LOW (ref >59)
Glucose: 81 mg/dL (ref 65–99)
Potassium: 4.1 mmol/L (ref 3.5–5.2)
Sodium: 143 mmol/L (ref 134–144)

## 2019-10-19 NOTE — Progress Notes (Signed)
Cardiology Office Note   Date:  10/20/2019   ID:  Logan Robertson, DOB 1955-05-11, MRN [id number]  PCP:  Danna Hefty, DO  Cardiologist:  [place] ([place]). Dr.Christopher  CC: Hospital Follow Up   History of Present Illness: Logan Robertson is a 64 y.o. male who presents for posthospitalization follow-up.  He was seen on consultation by Dr. Harrell Gave on 09/29/2019 in the setting of atrial flutter.  He has a history of cardiomyopathy, hyperlipidemia, hypertension, CAD with possible MI in 2008, history of Lyme disease, OSA on CPAP, and a CVA disease although this has not been documented, with prostate cancer.  Per notes, on chart review he had a nuclear stress test in 2013 showing an EF of 30% with inferior scar.  EF of 40 to 45% with inferior hypokinesis per echo.  His cardiomyopathy was due to uncontrolled hypertension per notes.  Repeat nuclear medicine stress test in 2018 revealed no ischemia with an EF of 58%.  Echocardiogram repeated during that time showed normal EF.  Due to symptoms of dyspnea, chest discomfort, and reduced ejection fraction, it was determined that the patient would benefit from cardiac catheterization for better evaluation of coronary anatomy.  The patient was also found to have paroxysmal atrial fibrillation. CHADS VASC Score of 6, he was started on Eliquis and metoprolol 50 mg daily. May consider changing to [facility] on follow up.  Right and left cardiac catheterization dated 09/30/2019, revealed mild pulmonary hypertension.  No angiographically apparent coronary artery disease.  Patient was found to have severe right subclavian tortuosity and therefore femoral approach was used.  It was recommended that right radial approach not be attempted in the future.  He comes today feeling well.  He denies any rapid heart rhythm or palpitations.  He is medically compliant with Eliquis, and denies any melena, bleeding, hemoptysis, or excessive bruising.  He is also interested  in his lower extremity vascular ultrasound/ABI report.  This was completed on 10/01/2019.  Both right and left ABIs were abnormal.  Left 0.68, right 0.73.  He is to be referred to PV.  I offered to make an appointment for him with our providers, but he wanted to talk with Dr. Tarry Kos first as she is sending in the referrals.  The patient is also not been on CPAP as he moved here from [place] 4 weeks ago and turned his machine in.  He was not aware that he could continue to use the same machine after relocating.   Past Medical History:  Diagnosis Date   Hypertension    Lyme disease    Myocardial infarction [facility])    Sleep apnea     Past Surgical History:  Procedure Laterality Date   RIGHT/LEFT HEART CATH AND CORONARY ANGIOGRAPHY N/A 09/30/2019   Procedure: RIGHT/LEFT HEART CATH AND CORONARY ANGIOGRAPHY;  Surgeon: Jettie Booze, MD;  Location: [place] CV LAB;  Service: Cardiovascular;  Laterality: N/A;     Current Outpatient Medications  Medication Sig Dispense Refill   acetaminophen (TYLENOL) 325 MG tablet Take 650 mg by mouth every 6 (six) hours as needed for moderate pain.     apixaban (ELIQUIS) 5 MG TABS tablet Take 1 tablet (5 mg total) by mouth 2 (two) times daily. 60 tablet 0   atorvastatin (LIPITOR) 80 MG tablet Take 0.5 tablets (40 mg total) by mouth daily. 30 tablet 0   furosemide (LASIX) 40 MG tablet Take 1 tablet (40 mg total) by mouth daily. 30 tablet 0   hydrALAZINE (  APRESOLINE) 50 MG tablet Take 1 tablet (50 mg total) by mouth 3 (three) times daily. 90 tablet 0   isosorbide mononitrate (IMDUR) 60 MG 24 hr tablet Take 1 tablet (60 mg total) by mouth daily. 30 tablet 0   metoprolol succinate (TOPROL-XL) 50 MG 24 hr tablet Take 1 tablet (50 mg total) by mouth daily. Take with or immediately following a meal. 30 tablet 0   potassium chloride SA (KLOR-CON) 20 MEQ tablet Take 1 tablet (20 mEq total) by mouth daily. 30 tablet 0   valsartan (DIOVAN) 320 MG  tablet Take 320 mg by mouth daily.     No current facility-administered medications for this visit.    Allergies:   Lisinopril    Social History:  The patient  reports that he has never smoked. He has never used smokeless tobacco. He reports previous alcohol use. He reports previous drug use.   Family History:  The patient's family history is not on file.    ROS: All other systems are reviewed and negative. Unless otherwise mentioned in H&P    PHYSICAL EXAM: VS:  BP (!) 155/97    Pulse 75    Ht 6' (1.829 m)    Wt 274 lb 12.8 oz (124.6 kg)    BMI 37.27 kg/m  , BMI Body mass index is 37.27 kg/m. GEN: Well nourished, well developed, in no acute distress, obese HEENT: normal Neck: no JVD, carotid bruits, or masses Cardiac: RRR; occasional extrasystole, no murmurs, rubs, or gallops,no edema diminished pulses on the left, palpable pulse on the right. Respiratory:  Clear to auscultation bilaterally, normal work of breathing GI: soft, nontender, nondistended, + BS MS: no deformity or atrophy Skin: warm and dry, no rash Neuro:  Strength and sensation are intact Psych: euthymic mood, full affect   EKG: This sinus rhythm with occasional PVCs with possible left atrial enlargement.  Heart rate of 75 bpm left axis deviation noted with LVH.  (Personally reviewed)  Recent Labs: 09/28/2019: B Natriuretic Peptide 343.3 09/29/2019: Magnesium 1.7; TSH 1.512 10/01/2019: Hemoglobin 13.6; Platelets 284 10/06/2019: ALT 26 10/13/2019: BUN 17; Creatinine, Ser 1.28; Potassium 4.1; Sodium 143    Lipid Panel    Component Value Date/Time   CHOL 196 09/29/2019 0437   TRIG 98 09/29/2019 0437   HDL 42 09/29/2019 0437   CHOLHDL 4.7 09/29/2019 0437   VLDL 20 09/29/2019 0437   LDLCALC 134 (H) 09/29/2019 0437      Wt Readings from Last 3 Encounters:  10/20/19 274 lb 12.8 oz (124.6 kg)  10/06/19 280 lb (127 kg)  10/01/19 275 lb 4.8 oz (124.9 kg)      Other studies Reviewed: Cardiac cath  4/78/29  LV end diastolic pressure is normal.  There is no aortic valve stenosis.  Hemodynamic findings consistent with mild pulmonary hypertension.  Ao sat 97%, PA sat 69%, PA pressure 35/20, mean PA 26 mm Hg, mean PCWP 16 mm Hg; CO 6.9 L/min; CI 2.82  No angiographically apparent coronary artery disease.  Due to severe right subclavian tortuosity, we switched to the femoral approach. Would not attempt right radial approach in the future.  Continue medical therapy. Patient with intermittent atrial flutter during the case. He would convert back to NSR spontaneously, and once with IV metoprolol.   Nuclear stress test 07/2016 IMPRESSION:    1. A large in size, mild intensity, basal to distal inferior wall   attenuation is somewhat more intense and extends to  the infero-apex at rest. Otherwise normal  perfusion throughout.   Prone images reveal a small to moderate in size,  severe intensity, infero-apical-lateral and distal infero-lateral and   apical-lateral and a small in size, moderate  intensity, basal inferior wall defect with normal mid to distal   inferior wall perfusion.    2. Normal regadenoson ECG stress test.    3. Normal left ventricular systolic function and wall motion   post-stress, including the inferior and infero-apical  walls.    CONCLUSION - PROBABLE INFERIOR WALL ATTENUATION ARTIFACT. NO   DEFINITE AREAS OF MYOCARDIAL ISCHEMIA OF INFARCTION.   Echo 08/12/2016 Mitral Valve:  There is mild mitral annular calcification. No mitral valve regurgitation is detected.  Aortic Valve:  The aortic valve is trileaflet. Leaflet thickness and mobility are normal. No aortic stenosis is detected. No aortic regurgitation is detected.  Tricuspid Leaflet thickness and mobility are normal. Trace  tricuspid Valve regurgitation is detected. The estimated pulmonary artery systolic pressure is normal.  Pulmonic Valve: The pulmonic valve is not well  visualized, but is grossly normal.  Left Atrium:There is borderline left atrial enlargement.  Left Ventricle: Left ventricular cavity size is normal. Mild  concentric hypertrophy is detected. No regional wall motion abnormalities are detected. Left ventricular systolic function is normal (>55%). Tissue Doppler imaging demonstrates a lateral wall E' that is abnormal for patient's age and suggests diastolic dysfunction.  Tissue Doppler imaging demonstrates an E/E' ratio that is  8-15, which is indeterminant for the estimation of left  sided  filling pressures.  Right Atrium: The right atrium is normal in size.  Right Ventricle: Normal right ventricular cavity size and systolic function.  Pericardium: There is no pericardial effusion.  Pleura: No significant pleural effusion is detected.  Miscellaneous:The IVC is less than 2.1 cm and is > 50% collapsible consistent with a right atrial pressure of  approximately 3 mmHg. The aortic root diameter is normal.   Conclusions  Summary  Left ventricular systolic function is normal (>55%).  Tissue Doppler imaging demonstrates a lateral wall E' that is  abnormal for patient's age and suggests diastolic dysfunction   ABI 10/01/2019  ABI Findings:  +---------+------------------+-----+----------+--------+   Right   Rt Pressure (mmHg) Index Waveform  Comment    +---------+------------------+-----+----------+--------+   Brachial  190             triphasic         +---------+------------------+-----+----------+--------+   ATA    138         0.73  biphasic         +---------+------------------+-----+----------+--------+   PTA    138         0.73  monophasic        +---------+------------------+-----+----------+--------+   Great Toe 116         0.61  Abnormal         +---------+------------------+-----+----------+--------+   +---------+------------------+-----+---------+-------+   Left     Lt Pressure (mmHg) Index Waveform  Comment   +---------+------------------+-----+---------+-------+   Brachial  184             triphasic        +---------+------------------+-----+---------+-------+   ATA    130         0.68  biphasic         +---------+------------------+-----+---------+-------+   PTA    129         0.68  biphasic         +---------+------------------+-----+---------+-------+   Great Toe 117  0.62  Abnormal         +---------+------------------+-----+---------+-------+  Summary:  Right: Resting right ankle-brachial index indicates moderate right lower  extremity arterial disease. The right toe-brachial index is abnormal.   Left: Resting left ankle-brachial index indicates moderate left lower  extremity arterial disease. The left toe-brachial index is abnormal   ASSESSMENT AND PLAN:  1.  Paroxysmal atrial fibrillation: Patient continue anticoagulation therapy, metoprolol for heart rate control.  The fact that he has OSA and has not been on CPAP for a month may be contributing.  2.  PAD: Abnormal ABIs bilaterally.  I offered referral to Dr. Fletcher Anon, but he states that Dr. Tarry Kos is handling the referrals.  He has been given a copy of his ABI report.  3.  OSA: Return to CPAP machine before moving to [place] from [place] approximately 4 weeks ago.  We had a check-in with Barry Brunner. She states that he will need to have a repeat sleep study. This can be done as  Home sleep study. He has documented OSA on prior records from Dr. Corlis Hove, but will still require a re-study.   4. Hypertension: Not currently controlled Will titrate up medications on next visit if still elevated. Salty 6 and low sodium diet are recommended.   Current medicines are reviewed at length with the patient today.  I have spent 40 minutes dedicated to the care of this patient on the date of this encounter to include pre-visit review of  records, assessment, management and diagnostic testing,with shared decision making.  Labs/ tests ordered today include: None  Phill Myron. West Pugh, ANP, AACC   10/20/2019 12:55 PM    [facility] [address] Office [phone] Fax [phone]  Notice: This dictation was prepared with Dragon dictation along with smaller phrase technology. Any transcriptional errors that result from this process are unintentional and may not be corrected upon review.

## 2019-10-20 ENCOUNTER — Ambulatory Visit: Payer: Medicare HMO | Admitting: Adult Health

## 2019-10-20 ENCOUNTER — Other Ambulatory Visit: Payer: Self-pay

## 2019-10-20 ENCOUNTER — Encounter: Payer: Self-pay | Admitting: Adult Health

## 2019-10-20 VITALS — BP 155/97 | HR 75 | Ht 72.0 in | Wt 274.8 lb

## 2019-10-20 DIAGNOSIS — I739 Peripheral vascular disease, unspecified: Secondary | ICD-10-CM

## 2019-10-20 DIAGNOSIS — I10 Essential (primary) hypertension: Secondary | ICD-10-CM | POA: Diagnosis not present

## 2019-10-20 DIAGNOSIS — G4730 Sleep apnea, unspecified: Secondary | ICD-10-CM

## 2019-10-20 DIAGNOSIS — G4733 Obstructive sleep apnea (adult) (pediatric): Secondary | ICD-10-CM | POA: Diagnosis not present

## 2019-10-20 DIAGNOSIS — I4892 Unspecified atrial flutter: Secondary | ICD-10-CM

## 2019-10-20 NOTE — Patient Instructions (Signed)
Medication Instructions:  Continue current medications  *If you need a refill on your cardiac medications before your next appointment, please call your pharmacy*   Lab Work: None Ordered   Testing/Procedures: Your physician has recommended that you have a home sleep study. This test records several body functions during sleep, including: brain activity, eye movement, oxygen and carbon dioxide blood levels, heart rate and rhythm, breathing rate and rhythm, the flow of air through your mouth and nose, snoring, body muscle movements, and chest and belly movement.   Follow-Up: At [facility], you and your health needs are our priority.  As part of our continuing mission to provide you with exceptional heart care, we have created designated Provider Care Teams.  These Care Teams include your primary Cardiologist (physician) and Advanced Practice Providers (APPs -  Physician Assistants and Nurse Practitioners) who all work together to provide you with the care you need, when you need it.  We recommend signing up for the patient portal called "MyChart".  Sign up information is provided on this After Visit Summary.  MyChart is used to connect with patients for Virtual Visits (Telemedicine).  Patients are able to view lab/test results, encounter notes, upcoming appointments, etc.  Non-urgent messages can be sent to your provider as well.   To learn more about what you can do with MyChart, go to [email or web address].    Your next appointment:   3 month(s)  The format for your next appointment:   In Person  Provider:   You may see Buford Dresser, MD or one of the following Advanced Practice Providers on your designated Care Team:    Rosaria Ferries, PA-C  Jory Sims, DNP, ANP  Cadence Kathlen Mody, NP

## 2019-11-03 ENCOUNTER — Encounter: Payer: Self-pay | Admitting: Family Medicine

## 2019-11-03 ENCOUNTER — Ambulatory Visit (INDEPENDENT_AMBULATORY_CARE_PROVIDER_SITE_OTHER): Payer: Medicare HMO | Admitting: Family Medicine

## 2019-11-03 ENCOUNTER — Other Ambulatory Visit: Payer: Self-pay

## 2019-11-03 VITALS — BP 140/74 | HR 93 | Ht 72.0 in | Wt 274.0 lb

## 2019-11-03 DIAGNOSIS — I77810 Thoracic aortic ectasia: Secondary | ICD-10-CM | POA: Diagnosis not present

## 2019-11-03 DIAGNOSIS — I739 Peripheral vascular disease, unspecified: Secondary | ICD-10-CM

## 2019-11-03 DIAGNOSIS — I10 Essential (primary) hypertension: Secondary | ICD-10-CM | POA: Diagnosis not present

## 2019-11-03 NOTE — Assessment & Plan Note (Addendum)
Chronic, uncontrolled. Continues to have elevated BP's in office. Does have better BP's at home. Nonsmoker. Actively working out and losing weight. Scheduled for sleep study. BP goal <130/80.  - obtain ambulatory blood pressure study. Referral to Dr. Valentina Lucks placed. - follow up results and adjust meds if indicated - continue current medications as prescribed

## 2019-11-03 NOTE — Patient Instructions (Signed)
Thank you for coming to see me today. It was a pleasure to see you.   Please schedule appointment with Dr. Valentina Lucks for ambulatory blood pressure monitoring. I will call you with results and next steps.  Expect a call from the vascular surgeon clinic to schedule an appointment to further evaluate your peripheral arterial disease.   Please follow-up with Dr. Tarry Kos in 3 months (October)  If you have any questions or concerns, please do not hesitate to call the office at [phone].  Take Care,  Dr. Mina Marble, DO Resident Physician [place] [phone]

## 2019-11-03 NOTE — Progress Notes (Signed)
   Subjective:   Patient ID: Logan Robertson    DOB: March 06, 1956, 64 y.o. male   MRN: [id number]  Logan Robertson is a 64 y.o. male with a history of a flutter, HFrEF, PAD, nonischemic cardiomyopathy, essential hypertension, ascending aorta dilation, CKD 3, hyperlipidemia here for hypertension follow-up.  HTN:  BP: 140/74 today. Repeat 140/80. Currently on Hydralazine 50mg  TID, Imdur 60mg  QD, Metoprolol 50mg  QD, Farxiga 10mg  QD, and Valsartan 320mg  QD. Endorses compliance. Non-smoker. Denies any chest pain, SOB, vision changes, or headaches. Patient notes BP at home 130's/80's. He notes he has been swimming more and trying to lose weight. He is scheduled for sleep study to obtain CPAP this month.   PAD: Abnormal ABI's bilaterally: Left 0.68, right 0.73. Cardiology was going to refer to [facility] for further evaluation but patient wanted to have follow up visit with me first. He is open to referral.  Review of Systems:  Per HPI.   Objective:   BP 140/74   Pulse 93   Ht 6' (1.829 m)   Wt 274 lb (124.3 kg)   SpO2 97%   BMI 37.16 kg/m  Vitals and nursing note reviewed.  General: pleasant older male, sitting comfortably in exam chair, well nourished, well developed, in no acute distress with non-toxic appearance Resp: breathing comfortably on room air, speaking in full sentences Extremities: warm and well perfused, normal tone MSK:  gait normal Neuro: Alert and oriented, speech normal  Assessment & Plan:   Essential hypertension Chronic, uncontrolled. Continues to have elevated BP's in office. Does have better BP's at home. Nonsmoker. Actively working out and losing weight. Scheduled for sleep study. BP goal <130/80.  - obtain ambulatory blood pressure study. Referral to Dr. Valentina Lucks placed. - follow up results and adjust meds if indicated - continue current medications as prescribed  Peripheral arterial disease ([place]) Referal to [facility] placed. Patient instructed to expect call from their office  within the next week to schedule. Patient voiced understanding and agreement with plan.  Mina Marble, DO PGY-3, [facility] 11/03/2019 8:53 PM

## 2019-11-03 NOTE — Assessment & Plan Note (Signed)
Referal to [facility] placed. Patient instructed to expect call from their office within the next week to schedule. Patient voiced understanding and agreement with plan.

## 2019-11-04 ENCOUNTER — Ambulatory Visit (INDEPENDENT_AMBULATORY_CARE_PROVIDER_SITE_OTHER): Payer: Medicare HMO | Admitting: Pharmacist

## 2019-11-04 ENCOUNTER — Encounter: Payer: Self-pay | Admitting: Pharmacist

## 2019-11-04 ENCOUNTER — Other Ambulatory Visit: Payer: Self-pay | Admitting: Pharmacist

## 2019-11-04 VITALS — BP 136/87 | HR 87 | Ht 72.0 in | Wt 274.0 lb

## 2019-11-04 DIAGNOSIS — I5020 Unspecified systolic (congestive) heart failure: Secondary | ICD-10-CM

## 2019-11-04 DIAGNOSIS — I10 Essential (primary) hypertension: Secondary | ICD-10-CM | POA: Diagnosis not present

## 2019-11-04 MED ORDER — EMPAGLIFLOZIN 10 MG PO TABS: 10.0000 mg | ORAL_TABLET | Freq: Every day | ORAL | 3 refills | Status: DC

## 2019-11-04 MED ORDER — CANAGLIFLOZIN 300 MG PO TABS: 300.0000 mg | ORAL_TABLET | Freq: Every day | ORAL | 3 refills | Status: DC

## 2019-11-04 MED ORDER — EPLERENONE 25 MG PO TABS: 25.0000 mg | ORAL_TABLET | Freq: Every day | ORAL | 3 refills | Status: DC

## 2019-11-04 MED ORDER — SPIRONOLACTONE 25 MG PO TABS: 25.0000 mg | ORAL_TABLET | Freq: Every day | ORAL | 3 refills | Status: AC

## 2019-11-04 NOTE — Assessment & Plan Note (Signed)
HTN: Patient presents with uncontrolled hypertension (time of diagnosis unknown) while taking valsartan 320 mg daily (x35yrs), Toprol-XL 50 mg daily, Imdur 60 mg daily, hydralazine 50 mg daily, Lasix 40 mg daily. Varied BP readings at home require optimization of current hypertension medication regimen. BP Goal = < 130/80 mmHg. Medication adherence optimal.  - Counseled on lifestyle modifications for blood pressure control including reduced dietary sodium, increased exercise, adequate sleep.

## 2019-11-04 NOTE — Patient Instructions (Addendum)
Nice meeting you today!  Please STOP the Potassium pills   START spironolactone  25mg  once daily.   We will continue to work on determining the cost one of the "other medications" we are trying to get for you.   Come back in 1 week for lab and FP follow-up.

## 2019-11-04 NOTE — Progress Notes (Signed)
S:    Patient presents today, new to our clinic, in good spirits for hypertension evaluation, counseling, and management. Patient recently moved in the last month to [place] from [place] for retirement. Patient was referred and last seen by new Primary Care Provider on 11/03/2019.  Medication adherence reported as optimal. Says that he explicitly follows the directions on the label.  Current BP Medications include:  Hydralazine 50 mg TID, Imdur 60 mg daily, Toprol-XL 50 mg daily, Diovan 320 mg daily (x2 yrs)  Antihypertensives tried in the past include: irbesartan 300 mg daily, lisinopril (2018: angioedema after taking lisinopril x57yr, not requiring intubation; denies serious airway complications)  Exercise habits include: swims 20 laps (20 yard pool) - 15 mins for 20 laps ASCVD risk factors include: obesity, HTN, HLD  O:  Physical Exam Constitutional:      Appearance: Normal appearance. He is obese.  Pulmonary:     Effort: Pulmonary effort is normal.  Neurological:     Mental Status: He is alert.  Psychiatric:        Mood and Affect: Mood normal.        Behavior: Behavior normal.        Thought Content: Thought content normal.        Judgment: Judgment normal.    Review of Systems  Cardiovascular: Negative for leg swelling.  All other systems reviewed and are negative.  Home BP readings: Vary based on ability to swim. On days he swims - 118/84. On non-swim days, 140's/90's. Checks BP once daily at home.  Last 3 Office BP readings: BP Readings from Last 3 Encounters:  11/04/19 (!) 136/87  11/03/19 140/74  10/20/19 (!) 155/97    BMET    Component Value Date/Time   NA 143 10/13/2019 1500   K 4.1 10/13/2019 1500   CL 105 10/13/2019 1500   CO2 24 10/13/2019 1500   GLUCOSE 81 10/13/2019 1500   GLUCOSE 93 10/01/2019 0620   BUN 17 10/13/2019 1500   CREATININE 1.28 (H) 10/13/2019 1500   CALCIUM 9.3 10/13/2019 1500   GFRNONAA 58 (L) 10/13/2019 1500   GFRAA 67 10/13/2019  1500    Renal function: CrCl cannot be calculated (Patient's most recent lab result is older than the maximum 21 days allowed.).  Clinical ASCVD: Yes  The ASCVD Risk score Mikey Bussing DC Jr., et al., 2013) failed to calculate for the following reasons:   The patient has a prior MI or stroke diagnosis  A/P: HTN: Patient presents with uncontrolled hypertension (time of diagnosis unknown) while taking valsartan 320 mg daily (x50yrs), Toprol-XL 50 mg daily, Imdur 60 mg daily, hydralazine 50 mg daily, Lasix 40 mg daily. Varied BP readings at home require optimization of current hypertension medication regimen. BP Goal = < 130/80 mmHg. Medication adherence optimal.  - Counseled on lifestyle modifications for blood pressure control including reduced dietary sodium, increased exercise, adequate sleep.  HF: Patient presents to clinic today with longstanding chronic heart failure for the past 10 years, currently controlled on medication regimen. Per patient report, pertinent negatives for edema, SOB. Reports not weighing self at home. Medication Changes: - Initiate spironolactone 25 mg daily at bedtime - STOP potassium chloride 20 mEq supplement  - Consider starting Entresto at next visit for additional BP control.  SGLT2 tx appears to be cost prohibitive by insurance coverage payments> $100 per month for all agents.   Results reviewed and written information provided.   Total time in face-to-face counseling 50 minutes.  F/U Clinic Visit in 1 week for BMET and BP reevaluation.     Patient seen with Laurey Arrow, PharmD Candidate and Lorel Monaco, PharmD, PGY2 Pharmacy Resident.

## 2019-11-04 NOTE — Assessment & Plan Note (Signed)
HF: Patient presents to clinic today with longstanding chronic heart failure for the past 10 years, currently controlled on medication regimen. Per patient report, pertinent negatives for edema, SOB. Reports not weighing self at home. Medication Changes: - Initiate spironolactone 25 mg daily at bedtime - STOP potassium chloride 20 mEq supplement  - Consider starting Entresto at next visit for additional BP control.  SGLT2 tx appears to be cost prohibitive by insurance coverage payments> $100 per month for all agents.

## 2019-11-05 NOTE — Progress Notes (Signed)
Reviewed: I agree with Dr. Koval's documentation and management. 

## 2019-11-08 NOTE — Addendum Note (Signed)
Addended by: Danna Hefty on: 11/08/2019 02:04 PM   Modules accepted: Orders

## 2019-11-11 ENCOUNTER — Ambulatory Visit: Payer: Medicare HMO | Admitting: Pharmacist

## 2019-11-29 DIAGNOSIS — N529 Male erectile dysfunction, unspecified: Secondary | ICD-10-CM | POA: Insufficient documentation

## 2019-12-05 ENCOUNTER — Other Ambulatory Visit: Payer: Self-pay

## 2019-12-05 ENCOUNTER — Emergency Department (HOSPITAL_COMMUNITY)
Admission: EM | Admit: 2019-12-05 | Discharge: 2019-12-06 | Disposition: A | Discharge status: Home / Self Care | Payer: Medicare HMO | Attending: Emergency Medicine | Admitting: Emergency Medicine

## 2019-12-05 ENCOUNTER — Emergency Department (HOSPITAL_COMMUNITY): Payer: Medicare HMO

## 2019-12-05 ENCOUNTER — Encounter (HOSPITAL_COMMUNITY): Payer: Self-pay | Admitting: Emergency Medicine

## 2019-12-05 DIAGNOSIS — I130 Hypertensive heart and chronic kidney disease with heart failure and stage 1 through stage 4 chronic kidney disease, or unspecified chronic kidney disease: Secondary | ICD-10-CM | POA: Diagnosis not present

## 2019-12-05 DIAGNOSIS — Z7901 Long term (current) use of anticoagulants: Secondary | ICD-10-CM | POA: Diagnosis not present

## 2019-12-05 DIAGNOSIS — Z20822 Contact with and (suspected) exposure to covid-19: Secondary | ICD-10-CM | POA: Insufficient documentation

## 2019-12-05 DIAGNOSIS — Z79899 Other long term (current) drug therapy: Secondary | ICD-10-CM | POA: Diagnosis not present

## 2019-12-05 DIAGNOSIS — R0602 Shortness of breath: Secondary | ICD-10-CM | POA: Diagnosis not present

## 2019-12-05 DIAGNOSIS — N1831 Chronic kidney disease, stage 3a: Secondary | ICD-10-CM | POA: Insufficient documentation

## 2019-12-05 DIAGNOSIS — I509 Heart failure, unspecified: Secondary | ICD-10-CM | POA: Diagnosis not present

## 2019-12-05 DIAGNOSIS — R072 Precordial pain: Secondary | ICD-10-CM | POA: Diagnosis present

## 2019-12-05 LAB — CBC
HCT: 36.8 % — ABNORMAL LOW (ref 39.0–52.0)
Hemoglobin: 13.3 g/dL (ref 13.0–17.0)
MCH: 29.8 pg (ref 26.0–34.0)
MCHC: 36.1 g/dL — ABNORMAL HIGH (ref 30.0–36.0)
MCV: 82.3 fL (ref 80.0–100.0)
Platelets: 257 10*3/uL (ref 150–400)
RBC: 4.47 MIL/uL (ref 4.22–5.81)
RDW: 13.9 % (ref 11.5–15.5)
WBC: 13 10*3/uL — ABNORMAL HIGH (ref 4.0–10.5)
nRBC: 0 % (ref 0.0–0.2)

## 2019-12-05 LAB — BASIC METABOLIC PANEL
Anion gap: 11 (ref 5–15)
BUN: 16 mg/dL (ref 8–23)
CO2: 22 mmol/L (ref 22–32)
Calcium: 9.4 mg/dL (ref 8.9–10.3)
Chloride: 104 mmol/L (ref 98–111)
Creatinine, Ser: 1.32 mg/dL — ABNORMAL HIGH (ref 0.61–1.24)
GFR calc Af Amer: >60 mL/min (ref >60)
GFR calc non Af Amer: 56 mL/min — ABNORMAL LOW (ref >60)
Glucose, Bld: 118 mg/dL — ABNORMAL HIGH (ref 70–99)
Potassium: 3.4 mmol/L — ABNORMAL LOW (ref 3.5–5.1)
Sodium: 137 mmol/L (ref 135–145)

## 2019-12-05 LAB — TROPONIN I (HIGH SENSITIVITY)
Troponin I (High Sensitivity): 25 ng/L — ABNORMAL HIGH (ref <18)
Troponin I (High Sensitivity): 27 ng/L — ABNORMAL HIGH (ref <18)

## 2019-12-05 LAB — SARS CORONAVIRUS 2 BY RT PCR (HOSPITAL ORDER, PERFORMED IN ~~LOC~~ HOSPITAL LAB): SARS Coronavirus 2: NEGATIVE

## 2019-12-05 NOTE — ED Triage Notes (Signed)
C/o pressure to center of chest with SOB and decreased appetite since last night.

## 2019-12-06 LAB — TROPONIN I (HIGH SENSITIVITY): Troponin I (High Sensitivity): 26 ng/L — ABNORMAL HIGH (ref <18)

## 2019-12-06 LAB — BRAIN NATRIURETIC PEPTIDE: B Natriuretic Peptide: 495.5 pg/mL — ABNORMAL HIGH (ref 0.0–100.0)

## 2019-12-06 MED ORDER — METOPROLOL SUCCINATE ER 25 MG PO TB24
50.0000 mg | ORAL_TABLET | Freq: Every day | ORAL | Status: DC
  Administered 2019-12-06: 50 mg via ORAL
  Filled 2019-12-06: qty 2

## 2019-12-06 MED ORDER — HYDRALAZINE HCL 25 MG PO TABS
50.0000 mg | ORAL_TABLET | Freq: Once | ORAL | Status: AC
  Administered 2019-12-06: 50 mg via ORAL
  Filled 2019-12-06: qty 2

## 2019-12-06 NOTE — Discharge Instructions (Addendum)
Work-up for the chest pain and some of the shortness of breath without any acute findings.  Take your Lasix when you get home.  Follow-up with your doctors regarding the blood pressure in the congestive heart failure.  Return for any new or worse symptoms.  Covid testing was negative.  3 troponins which are cardiac markers were done without any significant change.  Or evidence of any significant abnormality.

## 2019-12-06 NOTE — ED Notes (Signed)
Patient asking for oxygen. States he had it before three months ago when he was admitted. Pt also states he sleeps with a cpap. Triage RN states to put patient on 2L for comfort.

## 2019-12-06 NOTE — ED Provider Notes (Signed)
[facility] EMERGENCY DEPARTMENT Provider Note   CSN: [id number] Arrival date & time: 12/05/19  1207     History Chief Complaint  Patient presents with  . Chest Pain    Logan Robertson is a 64 y.o. male.  Patient presenting with a complaint of substernal chest pain associated with some shortness of breath that has been ongoing since last night.  Patient had long stay in waiting room.  Patient is known to have hypertension.  Recently had cardiac cath.  No significant coronary artery disease.  Patient's troponins x2 while in the waiting room without any significant change.  And also states that he has some subjective shortness of breath.  This is been ongoing for a while.  No leg swelling.        Past Medical History:  Diagnosis Date  . Hypertension   . Lyme disease   . Myocardial infarction ([place])   . Sleep apnea     Patient Active Problem List   Diagnosis Date Noted  . Heart failure with reduced ejection fraction ([place]) 10/06/2019  . Peripheral arterial disease ([place]) 10/06/2019  . Non-ischemic cardiomyopathy ([place]) 10/06/2019  . Essential hypertension 10/06/2019  . Stage 3a chronic kidney disease 10/06/2019  . Paroxysmal atrial flutter ([place]) 10/06/2019  . Ascending aorta dilatation ([facility]) 10/06/2019  . Hyperlipidemia 10/06/2019  . Atrial flutter ([place]) 09/28/2019    Past Surgical History:  Procedure Laterality Date  . RIGHT/LEFT HEART CATH AND CORONARY ANGIOGRAPHY N/A 09/30/2019   Procedure: RIGHT/LEFT HEART CATH AND CORONARY ANGIOGRAPHY;  Surgeon: Jettie Booze, MD;  Location: [place] [facility];  Service: Cardiovascular;  Laterality: N/A;       No family history on file.  Social History   Tobacco Use  . Smoking status: Never Smoker  . Smokeless tobacco: Never Used  Substance Use Topics  . Alcohol use: Not Currently  . Drug use: Not Currently    Home Medications Prior to Admission medications   Medication Sig Start Date End Date  Taking? Authorizing Provider  acetaminophen (TYLENOL) 325 MG tablet Take 650 mg by mouth every 6 (six) hours as needed for moderate pain. Patient not taking: Reported on 11/04/2019    [provider]  apixaban (ELIQUIS) 5 MG TABS tablet Take 1 tablet (5 mg total) by mouth 2 (two) times daily. 10/01/19 11/04/19  Lyndee Hensen, DO  atorvastatin (LIPITOR) 80 MG tablet Take 0.5 tablets (40 mg total) by mouth daily. 10/01/19   Brimage, Ronnette Juniper, DO  furosemide (LASIX) 40 MG tablet Take 1 tablet (40 mg total) by mouth daily. 10/02/19 11/04/19  Lyndee Hensen, DO  hydrALAZINE (APRESOLINE) 50 MG tablet Take 1 tablet (50 mg total) by mouth 3 (three) times daily. 10/01/19 11/04/19  Lyndee Hensen, DO  isosorbide mononitrate (IMDUR) 60 MG 24 hr tablet Take 1 tablet (60 mg total) by mouth daily. 10/02/19 11/04/19  Lyndee Hensen, DO  metoprolol succinate (TOPROL-XL) 50 MG 24 hr tablet Take 1 tablet (50 mg total) by mouth daily. Take with or immediately following a meal. 10/02/19 11/04/19  Brimage, Ronnette Juniper, DO  spironolactone (ALDACTONE) 25 MG tablet Take 1 tablet (25 mg total) by mouth at bedtime. 11/04/19   Leavy Cella, RPH-CPP  valsartan (DIOVAN) 320 MG tablet Take 320 mg by mouth daily. 05/14/19   [provider]    Allergies    Lisinopril  Review of Systems   Review of Systems  Constitutional: Negative for chills and fever.  HENT: Negative for congestion, rhinorrhea and sore  throat.   Eyes: Negative for visual disturbance.  Respiratory: Positive for shortness of breath. Negative for cough.   Cardiovascular: Positive for chest pain. Negative for leg swelling.  Gastrointestinal: Negative for abdominal pain, diarrhea, nausea and vomiting.  Genitourinary: Negative for dysuria.  Musculoskeletal: Negative for back pain and neck pain.  Skin: Negative for rash.  Neurological: Negative for dizziness, light-headedness and headaches.  Hematological: Does not bruise/bleed easily.    Psychiatric/Behavioral: Negative for confusion.    Physical Exam Updated Vital Signs BP (!) 162/81 (BP Location: Right Arm)   Pulse 79   Temp 98.3 F (36.8 C) (Oral)   Resp 14   Ht 1.829 m (6')   Wt 119.7 kg   SpO2 93%   BMI 35.80 kg/m   Physical Exam  ED Results / Procedures / Treatments   Labs (all labs ordered are listed, but only abnormal results are displayed) Labs Reviewed  BASIC METABOLIC PANEL - Abnormal; Notable for the following components:      Result Value   Potassium 3.4 (*)    Glucose, Bld 118 (*)    Creatinine, Ser 1.32 (*)    GFR calc non Af Amer 56 (*)    All other components within normal limits  CBC - Abnormal; Notable for the following components:   WBC 13.0 (*)    HCT 36.8 (*)    MCHC 36.1 (*)    All other components within normal limits  BRAIN NATRIURETIC PEPTIDE - Abnormal; Notable for the following components:   B Natriuretic Peptide 495.5 (*)    All other components within normal limits  TROPONIN I (HIGH SENSITIVITY) - Abnormal; Notable for the following components:   Troponin I (High Sensitivity) 25 (*)    All other components within normal limits  TROPONIN I (HIGH SENSITIVITY) - Abnormal; Notable for the following components:   Troponin I (High Sensitivity) 27 (*)    All other components within normal limits  TROPONIN I (HIGH SENSITIVITY) - Abnormal; Notable for the following components:   Troponin I (High Sensitivity) 26 (*)    All other components within normal limits  SARS CORONAVIRUS 2 BY RT PCR [facility] ORDER, [place] [facility])    EKG EKG Interpretation  Date/Time:  Sunday December 05 2019 12:48:05 EDT Ventricular Rate:  97 PR Interval:  168 QRS Duration: 92 QT Interval:  380 QTC Calculation: 482 R Axis:   -27 Text Interpretation: Sinus rhythm with occasional Premature ventricular complexes and Premature atrial complexes Possible Left atrial enlargement Left ventricular hypertrophy with repolarization  abnormality ( R in aVL , Cornell product ) Prolonged QT Abnormal ECG Artifact Otherwise no significant change Confirmed by Fredia Sorrow [phone]) on 12/06/2019 8:19:21 AM   Radiology DG Chest Portable 1 View  Result Date: 12/05/2019 CLINICAL DATA:  Dyspnea EXAM: PORTABLE CHEST 1 VIEW COMPARISON:  09/28/2019 chest radiograph. FINDINGS: Stable cardiomediastinal silhouette with mild cardiomegaly. No pneumothorax. No pleural effusion. Lungs appear clear, with no acute consolidative airspace disease and no pulmonary edema. IMPRESSION: Stable mild cardiomegaly without pulmonary edema. No active pulmonary disease. Electronically Signed   By: Ilona Sorrel M.D.   On: 12/05/2019 13:43    Procedures Procedures (including critical care time)  Medications Ordered in ED Medications  metoprolol succinate (TOPROL-XL) 24 hr tablet 50 mg (50 mg Oral Given 12/06/19 1053)  hydrALAZINE (APRESOLINE) tablet 50 mg (50 mg Oral Given 12/06/19 7782)    ED Course  I have reviewed the triage vital signs and the nursing notes.  Pertinent labs & imaging results that were available during my care of the patient were reviewed by me and considered in my medical decision making (see chart for details).    MDM Rules/Calculators/A&P                          Patient nontoxic no acute distress.  Patient's oxygen levels with no oxygen are in the upper 90s.  Patient's blood pressure was elevated.  We had a long stay in the waiting room overnight so his hydralazine and Toprol XL was provided here this morning with some improvement in his pressures.  Third troponin was done since he continued to have chest discomfort overnight.  No significant change in that.  Patient's BNP is a little elevated higher than usual at 495.  Patient is followed by [facility] family medicine.  We will have patient follow-up with them for blood pressure.  Patient will take his Lasix when he gets home.  Will make appointment follow-up with [facility].   His Covid testing was negative.  Chest x-ray showed no evidence of any pulmonary edema.  Patient without any leg swelling.  Clinically not concerned about pulmonary embolus.  The subjective shortness of breath has been present for several weeks.  Final Clinical Impression(s) / ED Diagnoses Final diagnoses:  Precordial pain    Rx / DC Orders ED Discharge Orders    None       Fredia Sorrow, MD 12/06/19 1116

## 2019-12-13 ENCOUNTER — Other Ambulatory Visit: Payer: Self-pay

## 2019-12-13 ENCOUNTER — Institutional Professional Consult (permissible substitution): Payer: Medicare HMO | Admitting: Cardiothoracic Surgery

## 2019-12-13 ENCOUNTER — Other Ambulatory Visit: Payer: Self-pay | Admitting: Cardiothoracic Surgery

## 2019-12-13 VITALS — BP 145/94 | HR 82 | Temp 97.7°F | Resp 20 | Ht 72.0 in | Wt 264.0 lb

## 2019-12-13 DIAGNOSIS — I712 Thoracic aortic aneurysm, without rupture, unspecified: Secondary | ICD-10-CM

## 2019-12-13 DIAGNOSIS — T783XXA Angioneurotic edema, initial encounter: Secondary | ICD-10-CM | POA: Insufficient documentation

## 2019-12-13 DIAGNOSIS — T464X5A Adverse effect of angiotensin-converting-enzyme inhibitors, initial encounter: Secondary | ICD-10-CM | POA: Insufficient documentation

## 2019-12-13 DIAGNOSIS — I639 Cerebral infarction, unspecified: Secondary | ICD-10-CM | POA: Insufficient documentation

## 2019-12-13 DIAGNOSIS — I7121 Aneurysm of the ascending aorta, without rupture: Secondary | ICD-10-CM

## 2019-12-23 NOTE — Progress Notes (Signed)
[address]       [place] [address]             [phone]     CARDIOTHORACIC SURGERY CONSULTATION REPORT  Referring Provider is Lind Covert, * Primary Cardiologist is Buford Dresser, MD PCP is Danna Hefty, DO  Chief Complaint  Patient presents with  . Thoracic Aortic Aneurysm    Surgical consult, ECHO 09/29/19, Cardiac Cath 09/30/19,     HPI:  64 year old man with a history of heart failure symptoms and depressed ejection fraction is referred for evaluation of ascending aortic aneurysm.  This was diagnosed earlier this spring during a work-up for heart failure.  Of note, he has an ejection fraction of approximately 30%.  He has a mixed picture of cardiomyopathy influenced by moderate aortic insufficiency and chronic atrial dysrhythmias.  He underwent a left heart catheterization in June which demonstrated no significant coronary disease.  He is referred for evaluation of his ascending aortic aneurysm.  He denies any personal history of aneurysm or significant family history of aneurysm  Past Medical History:  Diagnosis Date  . Hypertension   . Lyme disease   . Myocardial infarction ([place])   . Sleep apnea     Past Surgical History:  Procedure Laterality Date  . RIGHT/LEFT HEART CATH AND CORONARY ANGIOGRAPHY N/A 09/30/2019   Procedure: RIGHT/LEFT HEART CATH AND CORONARY ANGIOGRAPHY;  Surgeon: Jettie Booze, MD;  Location: [place] [facility];  Service: Cardiovascular;  Laterality: N/A;    No family history on file.  Social History   Socioeconomic History  . Marital status: Single    Spouse name: Not on file  . Number of children: Not on file  . Years of education: Not on file  . Highest education level: Not on file  Occupational History  . Not on file  Tobacco Use  . Smoking status: Never Smoker  . Smokeless tobacco: Never Used  Substance and Sexual Activity  . Alcohol use: Not Currently  . Drug use: Not  Currently  . Sexual activity: Not on file  Other Topics Concern  . Not on file  Social History Narrative  . Not on file   Social Determinants of Health   Financial Resource Strain:   . Difficulty of Paying Living Expenses: Not on file  Food Insecurity:   . Worried About Charity fundraiser in the Last Year: Not on file  . Ran Out of Food in the Last Year: Not on file  Transportation Needs:   . Lack of Transportation (Medical): Not on file  . Lack of Transportation (Non-Medical): Not on file  Physical Activity:   . Days of Exercise per Week: Not on file  . Minutes of Exercise per Session: Not on file  Stress:   . Feeling of Stress : Not on file  Social Connections:   . Frequency of Communication with Friends and Family: Not on file  . Frequency of Social Gatherings with Friends and Family: Not on file  . Attends Religious Services: Not on file  . Active Member of Clubs or Organizations: Not on file  . Attends Archivist Meetings: Not on file  . Marital Status: Not on file  Intimate Partner Violence:   . Fear of Current or Ex-Partner: Not on file  . Emotionally Abused: Not on file  . Physically Abused: Not on file  . Sexually Abused: Not on file    Current Outpatient Medications  Medication Sig Dispense Refill  . acetaminophen (TYLENOL) 325 MG tablet Take 650 mg by mouth every 6 (six) hours as needed for moderate pain.     Marland Kitchen apixaban (ELIQUIS) 5 MG TABS tablet Take 1 tablet (5 mg total) by mouth 2 (two) times daily. 60 tablet 0  . atorvastatin (LIPITOR) 80 MG tablet Take 0.5 tablets (40 mg total) by mouth daily. 30 tablet 0  . furosemide (LASIX) 40 MG tablet Take 1 tablet (40 mg total) by mouth daily. 30 tablet 0  . hydrALAZINE (APRESOLINE) 50 MG tablet Take 1 tablet (50 mg total) by mouth 3 (three) times daily. 90 tablet 0  . isosorbide mononitrate (IMDUR) 60 MG 24 hr tablet Take 1 tablet (60 mg total) by mouth daily. 30 tablet 0  . metoprolol succinate  (TOPROL-XL) 50 MG 24 hr tablet Take 1 tablet (50 mg total) by mouth daily. Take with or immediately following a meal. 30 tablet 0  . spironolactone (ALDACTONE) 25 MG tablet Take 1 tablet (25 mg total) by mouth at bedtime. 90 tablet 3  . valsartan (DIOVAN) 320 MG tablet Take 320 mg by mouth daily.     No current facility-administered medications for this visit.    Allergies  Allergen Reactions  . Lisinopril Swelling    Angioedema - denies airway complications Tolerates ARBs      Review of Systems:   General:  Reduced energy, no weight change  Cardiac:  Endorses SOB with moderate exertion, history of palpitations/arrhythmia/atrial fibrillation, positive orthopnea   respiratory:  No home oxygen, denies cough,   GI:   Negative for symptoms  GU:   Negative  Vascular:  History of nonspecific leg cramps,   Neuro:   Denies  Musculoskeletal: Negative  Skin:   Negative  Psych:   Positive unusual recent stress associated with move from [place] to [place]  Eyes:   Negative  ENT:   Negative  Hematologic:  Negative  Endocrine:  Negative     Physical Exam:   BP (!) 145/94   Pulse 82   Temp 97.7 F (36.5 C) (Skin)   Resp 20   Ht 6' (1.829 m)   Wt 119.7 kg   SpO2 96% Comment: RA  BMI 35.80 kg/m   General:  Chronically ill-appearing no acute distress  HEENT:  Unremarkable   Neck:   no JVD, no bruits, no adenopathy   Chest:   clear to auscultation, symmetrical breath sounds, no wheezes, no rhonchi   CV:   Irregularly irregular no detectable murmur  Abdomen:  soft, non-tender, no masses   Extremities:  warm, well-perfused, pulses 1+ throughout, no LE edema  Rectal/GU  Deferred  Neuro:   Grossly non-focal and symmetrical throughout  Skin:   Clean and dry, no rashes, no breakdown   Diagnostic Tests:  I have personally reviewed his available imaging studies including echocardiogram from June 2021 and left heart catheterization from 2021.  He has not had definitive CT of  the chest to further evaluate ascending aortic aneurysm  Impression:  64 year old man with a known ascending aortic aneurysm.  He also has moderate aortic insufficiency and atrial dysrhythmias, both of which may be contributing to global symptoms of decreased energy and fatigue.    Plan:  Follow-up in 3 months with CT of chest to evaluate anatomy of ascending aortic aneurysm Report any symptoms of chest pain or back pain to local emergency department   I spent in excess of 30 minutes during the conduct of  this office consultation and >50% of this time involved direct face-to-face encounter with the patient for counseling and/or coordination of their care.          Level 3 Office Consult = 40 minutes         Level 4 Office Consult = 60 minutes         Level 5 Office Consult = 80 minutes  B.  Murvin Natal, MD 12/23/2019 1:46 PM

## 2019-12-30 ENCOUNTER — Encounter (HOSPITAL_BASED_OUTPATIENT_CLINIC_OR_DEPARTMENT_OTHER): Payer: Medicare HMO | Admitting: Cardiovascular Disease

## 2020-01-03 ENCOUNTER — Ambulatory Visit: Payer: Medicare HMO | Admitting: Cardiothoracic Surgery

## 2020-01-03 ENCOUNTER — Other Ambulatory Visit: Payer: Medicare HMO

## 2020-01-20 ENCOUNTER — Ambulatory Visit: Payer: Medicare HMO | Admitting: Cardiology

## 2020-05-03 ENCOUNTER — Telehealth (INDEPENDENT_AMBULATORY_CARE_PROVIDER_SITE_OTHER): Payer: Medicare HMO | Admitting: Family Medicine

## 2020-05-03 ENCOUNTER — Other Ambulatory Visit: Payer: Self-pay

## 2020-05-03 DIAGNOSIS — Z5329 Procedure and treatment not carried out because of patient's decision for other reasons: Secondary | ICD-10-CM

## 2020-05-03 DIAGNOSIS — Z91199 Patient's noncompliance with other medical treatment and regimen due to unspecified reason: Secondary | ICD-10-CM

## 2020-05-03 NOTE — Progress Notes (Signed)
Attempted to reach patient multiple times for scheduled telemedicine visit without success. LMoVM for call back to reschedule at earliest convenience.

## 2020-05-16 ENCOUNTER — Ambulatory Visit (INDEPENDENT_AMBULATORY_CARE_PROVIDER_SITE_OTHER): Payer: Medicare HMO | Admitting: Family Medicine

## 2020-05-16 ENCOUNTER — Other Ambulatory Visit: Payer: Self-pay

## 2020-05-16 ENCOUNTER — Ambulatory Visit (HOSPITAL_COMMUNITY)
Admission: RE | Admit: 2020-05-16 | Discharge: 2020-05-16 | Disposition: A | Discharge status: Home / Self Care | Payer: Medicare HMO | Source: Ambulatory Visit | Attending: Family Medicine | Admitting: Family Medicine

## 2020-05-16 VITALS — BP 120/72 | HR 68 | Ht 72.0 in | Wt 246.8 lb

## 2020-05-16 DIAGNOSIS — I4892 Unspecified atrial flutter: Secondary | ICD-10-CM

## 2020-05-16 DIAGNOSIS — Z8546 Personal history of malignant neoplasm of prostate: Secondary | ICD-10-CM

## 2020-05-16 DIAGNOSIS — R079 Chest pain, unspecified: Secondary | ICD-10-CM | POA: Diagnosis not present

## 2020-05-16 DIAGNOSIS — G4700 Insomnia, unspecified: Secondary | ICD-10-CM

## 2020-05-16 DIAGNOSIS — G4733 Obstructive sleep apnea (adult) (pediatric): Secondary | ICD-10-CM | POA: Insufficient documentation

## 2020-05-16 DIAGNOSIS — R0609 Other forms of dyspnea: Secondary | ICD-10-CM

## 2020-05-16 DIAGNOSIS — R0789 Other chest pain: Secondary | ICD-10-CM | POA: Diagnosis present

## 2020-05-16 DIAGNOSIS — R7303 Prediabetes: Secondary | ICD-10-CM

## 2020-05-16 DIAGNOSIS — I77810 Thoracic aortic ectasia: Secondary | ICD-10-CM

## 2020-05-16 DIAGNOSIS — Z6833 Body mass index (BMI) 33.0-33.9, adult: Secondary | ICD-10-CM

## 2020-05-16 DIAGNOSIS — R0600 Dyspnea, unspecified: Secondary | ICD-10-CM

## 2020-05-16 DIAGNOSIS — N1831 Chronic kidney disease, stage 3a: Secondary | ICD-10-CM

## 2020-05-16 DIAGNOSIS — E6609 Other obesity due to excess calories: Secondary | ICD-10-CM

## 2020-05-16 DIAGNOSIS — G4730 Sleep apnea, unspecified: Secondary | ICD-10-CM

## 2020-05-16 DIAGNOSIS — E785 Hyperlipidemia, unspecified: Secondary | ICD-10-CM

## 2020-05-16 DIAGNOSIS — D649 Anemia, unspecified: Secondary | ICD-10-CM

## 2020-05-16 DIAGNOSIS — C61 Malignant neoplasm of prostate: Secondary | ICD-10-CM

## 2020-05-16 DIAGNOSIS — E66811 Obesity, class 1: Secondary | ICD-10-CM

## 2020-05-16 DIAGNOSIS — I10 Essential (primary) hypertension: Secondary | ICD-10-CM

## 2020-05-16 DIAGNOSIS — I5020 Unspecified systolic (congestive) heart failure: Secondary | ICD-10-CM

## 2020-05-16 LAB — POCT GLYCOSYLATED HEMOGLOBIN (HGB A1C): Hemoglobin A1C: 5.5 % (ref 4.0–5.6)

## 2020-05-16 MED ORDER — METOPROLOL SUCCINATE ER 100 MG PO TB24: 100.0000 mg | ORAL_TABLET | Freq: Every day | ORAL | 2 refills | Status: DC

## 2020-05-16 MED ORDER — HYDRALAZINE HCL 50 MG PO TABS: 50.0000 mg | ORAL_TABLET | Freq: Three times a day (TID) | ORAL | 0 refills | Status: DC

## 2020-05-16 MED ORDER — APIXABAN 5 MG PO TABS
5.0000 mg | ORAL_TABLET | Freq: Two times a day (BID) | ORAL | 3 refills | Status: AC
Start: 2020-05-16 — End: 2020-06-15

## 2020-05-16 MED ORDER — ATORVASTATIN CALCIUM 80 MG PO TABS: 80.0000 mg | ORAL_TABLET | Freq: Every day | ORAL | 2 refills | Status: DC

## 2020-05-16 NOTE — Assessment & Plan Note (Signed)
Normal cardiac exam. Hr 60-80's during exam. EKG with sinus rhythm but frequent PVCs.  Continue Metoprolol 100mg  QD and Eliquis 5mg  BID. Refills provided.

## 2020-05-16 NOTE — Patient Instructions (Addendum)
It was a pleasure to see you today!  Thank you for choosing [facility] for your primary care.   Our plans for today were:  BE SURE TO CALL FIRST THING IN THE [facility]  [address]  Office [phone] Fax [phone]  Cardiovascular Thoracic Surgery  [address]       [place] [address]             [phone]     IO have called in refills of your medications. Please be sure to take all of these as prescribed.  If you develop recurrent chest pain, shortness of breath, pain behind the shoulder blades, dizziness, lightheadedness --> GO TO THE EMERGENCY ROOM IMMEDIATELY   Best Wishes,   Mina Marble, DO

## 2020-05-16 NOTE — Assessment & Plan Note (Signed)
Last Cr 1.98, eGFR 40 in September 2021.  BMP today to monitor.  May benefit from referral to nephrologist if persistent CKD IIIb 

## 2020-05-16 NOTE — Assessment & Plan Note (Addendum)
PSA to monitor Referral to urology for continued monitoring. Recently moved from [place].

## 2020-05-16 NOTE — Assessment & Plan Note (Signed)
Hemodynamically on room air.  Strongly encouraged to follow up with Dr. Orvan Seen with [facility] for continued close monitoring

## 2020-05-16 NOTE — Progress Notes (Addendum)
SUBJECTIVE:   Chief compliant/HPI: annual examination  Logan Robertson is a 65 y.o. who presents today for a check up.   PMH: ascending aorta dilatation, atrial flutter, HTN, HFrEF, NICM, paroxysmal a.flutter, PAD, stroke, CKD III, anemia, h/o angioedema with ACE-I, ED, HLD, obesity, stress incontinence  Acute Concerns:  More fatigue and "feels like I tire out easily when I walk around". Denies chest pain. Notes that when he walks around he gets very tired and SOB and has to sit to catch his breath. He notes that he had been swimming and losing weight. Hasnt been able to swim for about 3 weeks. Notes the symptoms have been occurring x 2-3 months.   HTN:  BP: 120/72 today. Currently on Jardianc 36m QD, Metoprolol 1067mQD, Spironolactone 2545mD, Torsemide 79m18m, Valsartan 379mg72m Endorses compliance. Non-smoker. Denies any chest pain, SOB, vision changes, or headaches.   HLD: Last lipid panel below. Currently on Atorvastatin 80mg 57mHas not been taking this medication. Denies any muscles aches or weakness.   Lab Results  Component Value Date   CHOL 196 09/29/2019   HDL 42 09/29/2019   LDLCALC 134 (H) 09/29/2019   TRIG 98 09/29/2019   CHOLHDL 4.7 09/29/2019     HFrEF (25-30%)  Nonischemic Cardiomyopathy  Most recent Echo (12/18/19) with severe global reduced LV EF of 25-30%, evidence of elevated LA pressure and G3DD. Severely dilated ascending aorta, restricted mitral valve motion, mild-mod mitral regurg, moderate left atrial enlargement. History of Cardiac Cath 09/30/19 that showed no significant disease.  Current medications include Jardianc 10mg Q88metoprolol 100mg QD57mironolactone 25mg QD,42msemide 79mg QD, 51martan 379mg QD [place] by physician in [place]. [place]. Patient denies any swelling. Does endorse more dyspnea with exertion over the last 2-3 months. Denies cough. Has been swimming more and limiting portions to  lose weight.    A. Flutter:  Chronic. Current medications include Metoprolol 100mg QD and78mquis 5 mg BID. He denies palpitations or arrhythmias.  Insmonia: Started on Trazodone 50mg: 1-2 ta49ms qHS. He notes improvement in his sleeping.  Sleep Apnea: Used to require CPAP. Denies further snoring or apnea spells. No longer uses CPAP. Has lost ~20lbs since August 2021.  Review of systems: See HPI  OBJECTIVE:   BP 120/72   Pulse 68   Ht 6' (1.829 m)   Wt 246 lb 12.8 oz (111.9 kg)   SpO2 98%   BMI 33.47 kg/m   General: pleasant older male, sitting comfortably in exam chair, well nourished, well developed, in no acute distress with non-toxic appearance  Episode of chest pain/SOB patient appears uncomfortable and in pain.  CV: regular rate and rhythm without murmurs, rubs, or gallops, 2+ radial and pedal pulses bilaterally, no significant LE edema appreicated Lungs: clear to auscultation bilaterally with normal work of breathing on room air, speaking in full sentences  During chest pain event, patient had difficulty speaking  Skin: warm, dry Extremities: warm and well perfused MSK:  gait normal Neuro: Alert and oriented, speech normal  Orthostatic VS for the past 72 hrs (Last 3 readings):  Orthostatic BP Patient Position Orthostatic Pulse  05/16/20 1659 119/78 Standing 81  05/16/20 1655 115/66 Sitting 74  05/16/20 1653 118/82 Supine 72   ASSESSMENT/PLAN:   Chest pain Acute. During office visit, patient encountered an episode of substernal chest pain that occurred at rest, described as "tightening", with associated difficultly catching breath and speaking. Episode  lasted approx 2 minutes before self resolving. Vitals obtains after event - patient was hemodynamically stable on room air. Normal pulses throughout. Denied back pain or referred pain. Denied illicit drug abuse. Endorsed compliance to prescribed medications. EKG obtained and revealed LVH, atrial enlargement, and T wave  inversions in leads I, V4-6 with evidence of ST depression.   Given patient's comorbidities, ACS, aortic dissection/rutpure is most concerning differential. Heart score 8. Based on abnormal EKG and symptoms witnessed during exam, strongly urged patient to report to ED for further evaluation. Also discussed case with cardiology who felt less likely ischemia given normal cath ~6 months ago, however given abnormal EKG, unable to rule out ischemia and thus would benefit from evaluation. Aortic etiology is also concerning complication given his underlying aortic dilation of 5cm.   Discussed risks of declining ED evaluation which included further heart damage and death. Patient opted against ED evaluation and signed AMA form. Emphasized strict ED precautions. Patient strongly encouraged to take ALL medications as prescribed and to call cardiologist and CVTS office first thing in the AM to schedule follow up appointment ASAP. Patient voiced understanding and agreement with plan.   Heart failure with reduced ejection fraction (HCC) Chronic. Worsening EF based on June vs September echo. Has required multiple hospitalizations for HF exacerbation. Endorses worsening DOE over the last 2-3 months, denied any LE swelling, orthopnea, or PND. Given acute chest pain event, this was not fully addressed as planned.  Current medications: Jardianc 10mg  QD, Metoprolol 100mg  QD, Spironolactone 25mg  QD, Torsemide 20mg  QD, Valsartan 320mg  QD. Endorsed compliance. Encouraged to follow up with cardiology. Recent weight loss thus unclear dry weight.  BNP to evaluate fluid status   Ascending aorta dilatation (HCC) Hemodynamically on room air.  Strongly encouraged to follow up with Dr. Orvan Seen with CVTS for continued close monitoring   Atrial flutter ([place]) Normal cardiac exam. Hr 60-80's during exam. EKG with sinus rhythm but frequent PVCs.  Continue Metoprolol 100mg  QD and Eliquis 5mg  BID. Refills provided.   Essential  hypertension Chronic. Normotensive today.  Medications updated and include Jardianc 10mg  QD, Metoprolol 100mg  QD, Spironolactone 25mg  QD, Torsemide 20mg  QD, Valsartan 320mg  QD Refills provided Encouraged to follow up with cardiology   Prostate cancer [facility]) PSA to monitor Referral to urology for continued monitoring. Recently moved from [place].   Stage 3a chronic kidney disease Last Cr 1.98, eGFR 40 in September 2021.  BMP today to monitor.  May benefit from referral to nephrologist if persistent CKD IIIb  Anemia Last Hgb 13.8, normal MCV in 12/2019. CBC to monitor.  Hyperlipidemia Chronic. Has not been compliant with statin - restart Atorvastatin 80mg  QD  Obesity  Has lost ~20 lbs since October 2021. Has been swimming and decreasing food intake. Congratulated patient on weight loss and encouraged continued weight loss.  Sleep apnea History of sleep apnea requiring CPAP, diagnosed in [place]. Since weight loss, patient denies any snoring or apnea spells.  Consider repeat sleep study - will discuss at follow up visits   Insomnia Established problem. Prescribed Trazodone 50-100mg  qHS PRN by provider in [place]. Endorsed improved symptoms with this medication.  DOE (dyspnea on exertion) Acute. Worsening over last 2-3 months. Endorsed fatigue and DOE requiring rest to improve. Denied palpitations or chest pain, LE swelling, orthopnea, PND. Has been swimming and losing weight until ~3 weeks ago. Denied infectious symptoms or cough. Traveled from [place] in early December.  Unclear etiology. Limited evaluation given acute chest pain event. Orthostatics  normal. Normotensive with HR 70-80;s and normal cardiac and lung exam. EKG without signs of afib/flutter but did reveal frequent PVCs.  Differential is broad and includes underlying HFrEF vs  arrythmia vs anemia vs low testosterone. No infectious symptoms. Wells Score 0.  - CBC, BNP, BMP to evaluate - return for further  evaluation, ED if worsening - follow up with cardiology    Screening Labs: Hepatitis B, hepatitis C,   Health Maintenance: Due for colonoscopy, TDAP, PNA, and 3rd COVID vaccine. Due to acute concerns, this was not addressed.  Follow up at earliest convenience for close monitoring.   [place]

## 2020-05-16 NOTE — Assessment & Plan Note (Addendum)
Chronic. Worsening EF based on June vs September echo. Has required multiple hospitalizations for HF exacerbation. Endorses worsening DOE over the last 2-3 months, denied any LE swelling, orthopnea, or PND. Given acute chest pain event, this was not fully addressed as planned.  Current medications: Jardianc 10mg  QD, Metoprolol 100mg  QD, Spironolactone 25mg  QD, Torsemide 20mg  QD, Valsartan 320mg  QD. Endorsed compliance. Encouraged to follow up with cardiology. Recent weight loss thus unclear dry weight.  BNP to evaluate fluid status

## 2020-05-16 NOTE — Assessment & Plan Note (Signed)
Chronic. Normotensive today.  Medications updated and include Jardianc 10mg  QD, Metoprolol 100mg  QD, Spironolactone 25mg  QD, Torsemide 20mg  QD, Valsartan 320mg  QD Refills provided Encouraged to follow up with cardiology

## 2020-05-16 NOTE — Assessment & Plan Note (Signed)
Has lost ~20 lbs since October 2021. Has been swimming and decreasing food intake. Congratulated patient on weight loss and encouraged continued weight loss.

## 2020-05-16 NOTE — Assessment & Plan Note (Signed)
Last Hgb 13.8, normal MCV in 12/2019. CBC to monitor.

## 2020-05-16 NOTE — Assessment & Plan Note (Addendum)
Acute. During office visit, patient encountered an episode of substernal chest pain that occurred at rest, described as "tightening", with associated difficultly catching breath and speaking. Episode lasted approx 2 minutes before self resolving. Vitals obtains after event - patient was hemodynamically stable on room air. Normal pulses throughout. Denied back pain or referred pain. Denied illicit drug abuse. Endorsed compliance to prescribed medications. EKG obtained and revealed LVH, atrial enlargement, and T wave inversions in leads I, V4-6 with evidence of ST depression.   Given patient's comorbidities, ACS, aortic dissection/rutpure is most concerning differential. Heart score 8. Based on abnormal EKG and symptoms witnessed during exam, strongly urged patient to report to ED for further evaluation. Also discussed case with cardiology who felt less likely ischemia given normal cath ~6 months ago, however given abnormal EKG, unable to rule out ischemia and thus would benefit from evaluation. Aortic etiology is also concerning complication given his underlying aortic dilation of 5cm.   Discussed risks of declining ED evaluation which included further heart damage and death. Patient opted against ED evaluation and signed AMA form. Emphasized strict ED precautions. Patient strongly encouraged to take ALL medications as prescribed and to call cardiologist and CVTS office first thing in the AM to schedule follow up appointment ASAP. Patient voiced understanding and agreement with plan.

## 2020-05-16 NOTE — Assessment & Plan Note (Signed)
Established problem. Prescribed Trazodone 50-100mg  qHS PRN by provider in [place]. Endorsed improved symptoms with this medication.

## 2020-05-16 NOTE — Assessment & Plan Note (Signed)
Chronic. Has not been compliant with statin - restart Atorvastatin 80mg  QD

## 2020-05-16 NOTE — Assessment & Plan Note (Signed)
Acute. Worsening over last 2-3 months. Endorsed fatigue and DOE requiring rest to improve. Denied palpitations or chest pain, LE swelling, orthopnea, PND. Has been swimming and losing weight until ~3 weeks ago. Denied infectious symptoms or cough. Traveled from [place] in early December.  Unclear etiology. Limited evaluation given acute chest pain event. Orthostatics normal. Normotensive with HR 70-80;s and normal cardiac and lung exam. EKG without signs of afib/flutter but did reveal frequent PVCs.  Differential is broad and includes underlying HFrEF vs  arrythmia vs anemia vs low testosterone. No infectious symptoms. Wells Score 0.  - CBC, BNP, BMP to evaluate - return for further evaluation, ED if worsening - follow up with cardiology

## 2020-05-16 NOTE — Assessment & Plan Note (Signed)
History of sleep apnea requiring CPAP, diagnosed in [place]. Since weight loss, patient denies any snoring or apnea spells.  Consider repeat sleep study - will discuss at follow up visits

## 2020-05-17 LAB — HEPATITIS C ANTIBODY: Hep C Virus Ab: <0.1 s/co ratio (ref 0.0–0.9)

## 2020-05-17 LAB — BASIC METABOLIC PANEL
BUN/Creatinine Ratio: 13 (ref 10–24)
BUN: 23 mg/dL (ref 8–27)
CO2: 26 mmol/L (ref 20–29)
Calcium: 9.1 mg/dL (ref 8.6–10.2)
Chloride: 99 mmol/L (ref 96–106)
Creatinine, Ser: 1.81 mg/dL — ABNORMAL HIGH (ref 0.76–1.27)
GFR calc Af Amer: 44 mL/min/{1.73_m2} — ABNORMAL LOW (ref >59)
GFR calc non Af Amer: 38 mL/min/{1.73_m2} — ABNORMAL LOW (ref >59)
Glucose: 93 mg/dL (ref 65–99)
Potassium: 3.6 mmol/L (ref 3.5–5.2)
Sodium: 140 mmol/L (ref 134–144)

## 2020-05-17 LAB — CBC
Hematocrit: 40.3 % (ref 37.5–51.0)
Hemoglobin: 14.3 g/dL (ref 13.0–17.7)
MCH: 32.2 pg (ref 26.6–33.0)
MCHC: 35.5 g/dL (ref 31.5–35.7)
MCV: 91 fL (ref 79–97)
Platelets: 218 10*3/uL (ref 150–450)
RBC: 4.44 x10E6/uL (ref 4.14–5.80)
RDW: 14.6 % (ref 11.6–15.4)
WBC: 8.1 10*3/uL (ref 3.4–10.8)

## 2020-05-17 LAB — HEPATITIS B SURFACE ANTIGEN: Hepatitis B Surface Ag: NEGATIVE

## 2020-05-17 LAB — PSA: Prostate Specific Ag, Serum: <0.1 ng/mL (ref 0.0–4.0)

## 2020-05-19 ENCOUNTER — Other Ambulatory Visit: Payer: Self-pay | Admitting: Family Medicine

## 2020-05-19 DIAGNOSIS — I428 Other cardiomyopathies: Secondary | ICD-10-CM

## 2020-05-19 DIAGNOSIS — I4892 Unspecified atrial flutter: Secondary | ICD-10-CM

## 2020-05-19 DIAGNOSIS — I739 Peripheral vascular disease, unspecified: Secondary | ICD-10-CM

## 2020-05-19 DIAGNOSIS — E785 Hyperlipidemia, unspecified: Secondary | ICD-10-CM

## 2020-05-19 DIAGNOSIS — N1832 Chronic kidney disease, stage 3b: Secondary | ICD-10-CM

## 2020-05-19 DIAGNOSIS — I10 Essential (primary) hypertension: Secondary | ICD-10-CM

## 2020-05-19 DIAGNOSIS — R079 Chest pain, unspecified: Secondary | ICD-10-CM

## 2020-05-23 NOTE — Progress Notes (Signed)
Subjective:   Patient ID: Logan Robertson    DOB: [birth year], 65 y.o. male   MRN: [id number]  Logan Robertson is a 65 y.o. male with a history of ascending aorta dilatation, atrial flutter, HTN, HFrEF (25%), NICM, PAD, h/o stroke, h/o sleep apnea, h/o prostate cancer, CKD III, anemia, acute chest pain, DOE, ED, HLD, insomnia, obesity, stress incontinenence here for follow up  Chest pain: Seen on 05/16/20 to reestablish care after moving back from [place]. During encounter patient encountered acute substernal chest pain at rest with associated difficulty breathing, no radiation, self resolved in 2-3 minutes. EKG with acute ST changes. Patient urged to report to ED but he declined. He was urged to continue medications and follow up with cardiology ASAP. Patient notes he has not has any further chest pain. He has scheduled follow up with cardiology today. Awaiting call back from CVTS and has urology appointment scheduled for March. He continues to have low energy and weight loss. Denies palpitations, difficulty breathing at rest or lying down, PND. Denies chest pain with exertion. Denies edema.  Health Maintenance: Health Maintenance Due  Topic  . TETANUS/TDAP   . COLONOSCOPY (Pts 45-57yrs Insurance coverage will need to be confirmed)   . PNA vac Low Risk Adult (1 of 2 - PCV13)   Review of Systems:  Per HPI.   Objective:   BP 102/70   Pulse 68   Ht 6' (1.829 m)   Wt 238 lb (108 kg)   SpO2 95%   BMI 32.28 kg/m  Vitals and nursing note reviewed.  General: pleasant older male, sitting comfortably in exam chair, well nourished, well developed, in no acute distress with non-toxic appearance CV: regular rate and rhythm without murmurs, rubs, or gallops, no lower extremity edema, 2+ radial and pedal pulses bilaterally Lungs: clear to auscultation bilaterally with normal work of breathing on room air, speaking in full sentences Abdomen: soft, non-tender, non-distended, normoactive bowel  sounds Skin: warm, dry Extremities: warm and well perfused MSK: gait normal Neuro: Alert and oriented, speech normal  Assessment & Plan:   Ascending aorta dilatation [facility]) Patient awaiting call back for appointment with [facility].   Atrial flutter ([place]) Chronic. CHAD2VASC score = 5 (age, CHF, HTN, h/o stroke). HR 60's today. Normal cardiac exam.  - continue Metoprolol 100mg  QD and Eliquis 5mg  BID.  - follow up with cardiology as scheduled  Essential hypertension Soft blood pressure today with symptoms recently. Deferred change in management given patient has follow up appointment with cardiology today.   CKD (chronic kidney disease) stage 3, GFR 30-59 ml/min (HCC) Chronic, worsening. Creatinine continues to decline.  Cr 1.81>2.08 today. GFR 37. CrCl 54.  - Referred to nephrology - Will monitor closely. Medications reviewed for safety and no changes at this time. May need to hold certain medications if continues to decline.   Prostate cancer [facility]) Referred to urology for continued monitoring.  PSA <1. Follow up with urology.  Chest pain No recurrent chest pain since last visit. Cardiology appointment today.  Chronic fatigue Acute x 2-3 months with associated SOB with exertion. Has also had unexplained weight loss.  Unclear cause at this time. TSH, B12, Hep B/C, CBC, electrolytes normal. Poor kidney function - chronic and worsening. A1C 5.5. HIV negative in June 2021. Denies symptoms of further sleep apnea.  Not up to date on cancer screening.  Differential could include malignancy, advanced chronic disease, adrenal insufficiency. Will continue to work up at follow up visit. Consider hepatic function, FOBT,  AM cortisol level, review nonprescription medications,   Weight loss Acute. Unintentional. Weight has decreased from 120lb>102lb just this month. Almost a 40lb weight loss in 6 months. Was trying to swim but has stopped doing this for about 1 month.  TSH, B12, Hep B/C, CBC,  electrolytes normal. Poor kidney function - chronic and worsening. A1C 5.5. HIV negative in June 2021. Denies symptoms of further sleep apnea.  Not up to date on cancer screening.  Close follow up scheduled for continued work up. Plan as above.  Orders Placed This Encounter  Procedures  . Basic Metabolic Panel  . Vitamin B12  . TSH   No orders of the defined types were placed in this encounter.  Mina Marble, DO PGY-3, [facility] 05/25/2020 9:06 PM

## 2020-05-24 ENCOUNTER — Encounter: Payer: Self-pay | Admitting: Cardiology

## 2020-05-24 ENCOUNTER — Other Ambulatory Visit: Payer: Self-pay

## 2020-05-24 ENCOUNTER — Encounter: Payer: Self-pay | Admitting: Family Medicine

## 2020-05-24 ENCOUNTER — Ambulatory Visit (INDEPENDENT_AMBULATORY_CARE_PROVIDER_SITE_OTHER): Payer: Medicare HMO | Admitting: Cardiology

## 2020-05-24 ENCOUNTER — Ambulatory Visit (INDEPENDENT_AMBULATORY_CARE_PROVIDER_SITE_OTHER): Payer: Medicare HMO | Admitting: Family Medicine

## 2020-05-24 VITALS — BP 104/56 | HR 64 | Ht 72.0 in | Wt 239.2 lb

## 2020-05-24 VITALS — BP 102/70 | HR 68 | Ht 72.0 in | Wt 238.0 lb

## 2020-05-24 DIAGNOSIS — Z8673 Personal history of transient ischemic attack (TIA), and cerebral infarction without residual deficits: Secondary | ICD-10-CM | POA: Insufficient documentation

## 2020-05-24 DIAGNOSIS — I10 Essential (primary) hypertension: Secondary | ICD-10-CM

## 2020-05-24 DIAGNOSIS — I4892 Unspecified atrial flutter: Secondary | ICD-10-CM

## 2020-05-24 DIAGNOSIS — I428 Other cardiomyopathies: Secondary | ICD-10-CM | POA: Diagnosis not present

## 2020-05-24 DIAGNOSIS — R079 Chest pain, unspecified: Secondary | ICD-10-CM | POA: Diagnosis not present

## 2020-05-24 DIAGNOSIS — Z1211 Encounter for screening for malignant neoplasm of colon: Secondary | ICD-10-CM

## 2020-05-24 DIAGNOSIS — N1832 Chronic kidney disease, stage 3b: Secondary | ICD-10-CM | POA: Diagnosis not present

## 2020-05-24 DIAGNOSIS — R0602 Shortness of breath: Secondary | ICD-10-CM

## 2020-05-24 DIAGNOSIS — I739 Peripheral vascular disease, unspecified: Secondary | ICD-10-CM

## 2020-05-24 DIAGNOSIS — R5382 Chronic fatigue, unspecified: Secondary | ICD-10-CM | POA: Diagnosis not present

## 2020-05-24 DIAGNOSIS — R634 Abnormal weight loss: Secondary | ICD-10-CM | POA: Diagnosis not present

## 2020-05-24 DIAGNOSIS — I77810 Thoracic aortic ectasia: Secondary | ICD-10-CM

## 2020-05-24 DIAGNOSIS — E7800 Pure hypercholesterolemia, unspecified: Secondary | ICD-10-CM

## 2020-05-24 DIAGNOSIS — R0789 Other chest pain: Secondary | ICD-10-CM

## 2020-05-24 DIAGNOSIS — C61 Malignant neoplasm of prostate: Secondary | ICD-10-CM

## 2020-05-24 MED ORDER — TORSEMIDE 20 MG PO TABS: 20.0000 mg | ORAL_TABLET | ORAL | 3 refills | Status: AC | PRN

## 2020-05-24 NOTE — Patient Instructions (Signed)
It was a pleasure to see you today!  Thank you for choosing [facility] for your primary care.   Our plans for today were:  I am getting labs today to look at your weight loss and fatigue  Please follow up with the specialists as scheduled  I will see you in 2 weeks for continued follow up  To keep you healthy, please keep in mind the following health maintenance items that you are due for:   1. Tettanus 2. Pneumonia vaccine 3.  Colonoscopy   Best Wishes,   Mina Marble, DO

## 2020-05-24 NOTE — Progress Notes (Signed)
Cardiology Office Note:    Date:  05/24/2020   ID:  Logan Robertson, DOB 07/22/1955, MRN [id number]  PCP:  Danna Hefty, DO  Cardiologist:  Buford Dresser, MD  CC: follow up  History of Present Illness:    Logan Robertson is a 65 y.o. male with a hx of cardiomyopathy, atrial flutter, hypertension, hyperlipidemia, OSA, PAD, history of CVA, chronic kidney disease stage 3 who is seen for follow up today. I initially met him during his hospitalization 09/2019.  Today:  Last seen by Jory Sims 10/20/19.   Recently, has lost some weight, around 8 lbs or so. Feels that his blood pressure may be too low as he gets lightheaded on occasion. Had an appt with Dr. Tarry Kos recently, was told that kidney function had worsened and needed to be monitored. Per [facility], labs from 05/16/20 Cr 1.81. Has follow up 2/23 with Dr. Tarry Kos.  Weight at home stays within 2-3 lb range. No LE edema. No PND, stable 2 pillows, no orthopnea.   Has no energy. BP has been running low for a while. Gets very short of breath with minimal exertion, but then improves if he keeps walking. No palpitations/racing heart beats.  No fevers/chills, no cough.   Has an episode with his recent visit to Dr. Tarry Kos. Felt very short of breath, had chest pressure. Lasted about 5 minutes. He reports that his ECG was abnormal. On review, appears he was in sinus rhythm with ventricular trigeminy. Has felt the pressure before. Occurs rarely, can be 3-4 times over a period of months. Has noticed that it can happen when he is stressed/anxious. Not exertional, not related to position or food.  Past Medical History:  Diagnosis Date  . Hypertension   . Lyme disease   . Myocardial infarction ([place])   . Sleep apnea     Past Surgical History:  Procedure Laterality Date  . RIGHT/LEFT HEART CATH AND CORONARY ANGIOGRAPHY N/A 09/30/2019   Procedure: RIGHT/LEFT HEART CATH AND CORONARY ANGIOGRAPHY;  Surgeon: Jettie Booze, MD;  Location:  [facility];  Service: Cardiovascular;  Laterality: N/A;    Current Medications: Current Outpatient Medications on File Prior to Visit  Medication Sig  . acetaminophen (TYLENOL) 325 MG tablet Take 650 mg by mouth every 6 (six) hours as needed for moderate pain.   Marland Kitchen apixaban (ELIQUIS) 5 MG TABS tablet Take 1 tablet (5 mg total) by mouth 2 (two) times daily.  Marland Kitchen atorvastatin (LIPITOR) 80 MG tablet Take 1 tablet (80 mg total) by mouth daily.  . empagliflozin (JARDIANCE) 10 MG TABS tablet Take 10 mg by mouth daily.  . hydrALAZINE (APRESOLINE) 50 MG tablet Take 1 tablet (50 mg total) by mouth 3 (three) times daily.  . metoprolol succinate (TOPROL-XL) 100 MG 24 hr tablet Take 1 tablet (100 mg total) by mouth daily. Take with or immediately following a meal.  . spironolactone (ALDACTONE) 25 MG tablet Take 1 tablet (25 mg total) by mouth at bedtime.  . traZODone (DESYREL) 50 MG tablet Take 50-100 mg by mouth at bedtime.  . valsartan (DIOVAN) 320 MG tablet Take 320 mg by mouth daily.   No current facility-administered medications on file prior to visit.     Allergies:   Lisinopril   Social History   Tobacco Use  . Smoking status: Never Smoker  . Smokeless tobacco: Never Used  Substance Use Topics  . Alcohol use: Not Currently  . Drug use: Not Currently    Family History: family history  is not on file.  ROS:   Please see the history of present illness.  Additional pertinent ROS: Constitutional: Negative for chills, fever, night sweats, unintentional weight loss  HENT: Negative for ear pain and hearing loss.   Eyes: Negative for loss of vision and eye pain.  Respiratory: Negative for cough, sputum, wheezing.   Cardiovascular: See HPI. Gastrointestinal: Negative for abdominal pain, melena, and hematochezia.  Genitourinary: Negative for dysuria and hematuria.  Musculoskeletal: Negative for falls and myalgias.  Skin: Negative for itching and rash.  Neurological: Negative for  focal weakness, focal sensory changes and loss of consciousness.  Endo/Heme/Allergies: Does not bruise/bleed easily.     EKGs/Labs/Other Studies Reviewed:    The following studies were reviewed today: Cath 0/88/11  LV end diastolic pressure is normal.  There is no aortic valve stenosis.  Hemodynamic findings consistent with mild pulmonary hypertension.  Ao sat 97%, PA sat 69%, PA pressure 35/20, mean PA 26 mm Hg, mean PCWP 16 mm Hg; CO 6.9 L/min; CI 2.82  No angiographically apparent coronary artery disease.  Due to severe right subclavian tortuosity, we switched to the femoral approach. Would not attempt right radial approach in the future.   Continue medical therapy.  Patient with intermittent atrial flutter during the case.  He would convert back to NSR spontaneously, and once with IV metoprolol.   Echo 09/29/19 1. Left ventricular ejection fraction, by estimation, is 35 to 40%. The  left ventricle has moderately decreased function. The left ventricle  demonstrates global hypokinesis. There is mild concentric left ventricular  hypertrophy. Left ventricular  diastolic function could not be evaluated.  2. Right ventricular systolic function is normal. The right ventricular  size is normal. Tricuspid regurgitation signal is inadequate for assessing  PA pressure.  3. The mitral valve is normal in structure. Moderate mitral annular  calcification of the posterior mitral valve annulus .No evidence of mitral  valve regurgitation. No evidence of mitral stenosis.  4. The aortic valve is tricuspid. Aortic valve regurgitation is moderate.  Mild aortic valve sclerosis is present, with no evidence of aortic valve  stenosis. Aortic regurgitation PHT measures 329 msec.  5. Aortic dilatation noted. There is mild dilatation at the level of the  sinuses of Valsalva and severe dilatation of the ascending aorta measuring  43 mm and 84m respectively.  6. The inferior vena cava is normal  in size with greater than 50%  respiratory variability, suggesting right atrial pressure of 3 mmHg.  7. Left atrial size was severely dilated.    Nuclear stress test 07/2016 IMPRESSION:    1. A large in size, mild intensity, basal to distal inferior wall   attenuation is somewhat more intense and extends to  the infero-apex at rest. Otherwise normal perfusion throughout.   Prone images reveal a small to moderate in size,  severe intensity, infero-apical-lateral and distal infero-lateral and   apical-lateral and a small in size, moderate  intensity, basal inferior wall defect with normal mid to distal   inferior wall perfusion.    2. Normal regadenoson ECG stress test.    3. Normal left ventricular systolic function and wall motion   post-stress, including the inferior and infero-apical  walls.    CONCLUSION - PROBABLE INFERIOR WALL ATTENUATION ARTIFACT. NO   DEFINITE AREAS OF MYOCARDIAL ISCHEMIA OF INFARCTION.   Echo 08/12/2016 Mitral Valve:  There is mild mitral annular calcification. No mitral valve regurgitation is detected.  Aortic Valve:  The aortic valve is trileaflet. Leaflet thickness  and mobility are normal. No aortic stenosis is detected. No aortic regurgitation is detected.  Tricuspid Leaflet thickness and mobility are normal. Trace  tricuspid Valve regurgitation is detected. The estimated pulmonary artery systolic pressure is normal.  Pulmonic Valve: The pulmonic valve is not well visualized, but is grossly normal.  Left Atrium:There is borderline left atrial enlargement.  Left Ventricle: Left ventricular cavity size is normal. Mild  concentric hypertrophy is detected. No regional wall motion abnormalities are detected. Left ventricular systolic function is normal (>55%). Tissue Doppler imaging demonstrates a lateral wall E' that is abnormal for patient's age and suggests diastolic dysfunction.  Tissue Doppler imaging demonstrates an E/E'  ratio that is  8-15, which is indeterminant for the estimation of left  sided filling pressures.  Right Atrium: The right atrium is normal in size.  Right Ventricle: Normal right ventricular cavity size and systolic function.  Pericardium: There is no pericardial effusion.  Pleura: No significant pleural effusion is detected.  Miscellaneous:The IVC is less than 2.1 cm and is >50% collapsible consistent with a right atrial pressure of  approximately 3 mmHg. The aortic root diameter is normal.   Conclusions  Summary  Left ventricular systolic function is normal (>55%).  Tissue Doppler imaging demonstrates a lateral wall E' that is  abnormal for patient's age and suggests diastolic dysfunction   ABI 10/01/2019  ABI Findings:  +---------+------------------+-----+----------+--------+  Right  Rt Pressure (mmHg)IndexWaveform Comment   +---------+------------------+-----+----------+--------+  Brachial 190           triphasic       +---------+------------------+-----+----------+--------+  ATA   138        0.73 biphasic       +---------+------------------+-----+----------+--------+  PTA   138        0.73 monophasic      +---------+------------------+-----+----------+--------+  Great Toe116        0.61 Abnormal       +---------+------------------+-----+----------+--------+   +---------+------------------+-----+---------+-------+  Left   Lt Pressure (mmHg)IndexWaveform Comment  +---------+------------------+-----+---------+-------+  Brachial 184           triphasic      +---------+------------------+-----+---------+-------+  ATA   130        0.68 biphasic       +---------+------------------+-----+---------+-------+  PTA   129        0.68 biphasic       +---------+------------------+-----+---------+-------+  [place]         0.62 Abnormal       +---------+------------------+-----+---------+-------+  Summary:  Right: Resting right ankle-brachial index indicates moderate right lower  extremity arterial disease. The right toe-brachial index is abnormal.   Left: Resting left ankle-brachial index indicates moderate left lower  extremity arterial disease. The left toe-brachial index is abnormal  EKG:  EKG is personally reviewed.  The ekg ordered today demonstrates NSR at 64 bpm, LVH with repol  Recent Labs: 09/29/2019: Magnesium 1.7; TSH 1.512 10/06/2019: ALT 26 12/06/2019: B Natriuretic Peptide 495.5 05/16/2020: BUN 23; Creatinine, Ser 1.81; Hemoglobin 14.3; Platelets 218; Potassium 3.6; Sodium 140  Recent Lipid Panel    Component Value Date/Time   CHOL 196 09/29/2019 0437   TRIG 98 09/29/2019 0437   HDL 42 09/29/2019 0437   CHOLHDL 4.7 09/29/2019 0437   VLDL 20 09/29/2019 0437   LDLCALC 134 (H) 09/29/2019 0437    Physical Exam:    VS:  BP (!) 104/56 (BP Location: Left Arm, Patient Position: Sitting)   Pulse 64   Ht 6' (1.829 m)   Wt 239 lb 3.2  oz (108.5 kg)   SpO2 98%   BMI 32.44 kg/m     Wt Readings from Last 3 Encounters:  05/24/20 239 lb 3.2 oz (108.5 kg)  05/24/20 238 lb (108 kg)  05/16/20 246 lb 12.8 oz (111.9 kg)    GEN: Well nourished, well developed in no acute distress HEENT: Normal, moist mucous membranes NECK: No JVD CARDIAC: regular rhythm, normal S1 and S2, no rubs or gallops. No murmur. VASCULAR: Radial and DP pulses 2+ bilaterally. No carotid bruits RESPIRATORY:  Clear to auscultation without rales, wheezing or rhonchi  ABDOMEN: Soft, non-tender, non-distended MUSCULOSKELETAL:  Ambulates independently SKIN: Warm and dry, no edema NEUROLOGIC:  Alert and oriented x 3. No focal neuro deficits noted. PSYCHIATRIC:  Normal affect    ASSESSMENT:    1. NICM (nonischemic cardiomyopathy) (HCC)   2. Paroxysmal atrial flutter ([place])   3. SOB (shortness of breath)   4.  Chest tightness   5. Peripheral arterial disease ([place])   6. History of CVA (cerebrovascular accident)   7. Pure hypercholesterolemia    PLAN:    Intermittent chest tightness/shortness of breath -no CAD on cath -recent ECG with NSR with ventricular trigeminy. Sinus beats are same morphology on ECG today (LVH with repol) -may be related to PVCs -continue metoprolol succinate -if becomes more frequent, would consider a monitor -recheck echo  Nonischemic cardiomyopathy Hypertension -euvolemic today. With low blood pressure and worsening kidney function, will change torsemide to PRN -if BP remains low after making torsemide PRN, recommended he cut hydralazine dose in half. Ideally would like to wean him off hydralazine as it is difficult to take three times/day -continue metoprolol succinate, empagliflozin, valsartan, spironolactone -has BMET pending from earlier today  Paroxysmal atrial flutter -in NSR today -continue apixaban CHA2DS2/VAS Stroke Risk Points = 5   History of ? CVA in 2010 (no records) PAD Hypercholesterolemia -LDL goal <70 -last LDL 134 in 2021 -just had other labs drawn this AM, recheck lipids at follow up  Cardiac risk counseling and prevention recommendations: -recommend heart healthy/Mediterranean diet, with whole grains, fruits, vegetable, fish, lean meats, nuts, and olive oil. Limit salt. -recommend moderate walking, 3-5 times/week for 30-50 minutes each session. Aim for at least 150 minutes.week. Goal should be pace of 3 miles/hours, or walking 1.5 miles in 30 minutes -recommend avoidance of tobacco products. Avoid excess alcohol.  Plan for follow up: 4 weeks  Total time of encounter: 41 minutes total time of encounter, including 22 minutes spent in face-to-face patient care. This time includes coordination of care and counseling regarding medication management, heart failure, blood pressure and kidney disease. Remainder of non-face-to-face time involved  reviewing chart documents/testing relevant to the patient encounter and documentation in the medical record.  Buford Dresser, MD, PhD, [facility]   Medication Adjustments/Labs and Tests Ordered: Current medicines are reviewed at length with the patient today.  Concerns regarding medicines are outlined above.  Orders Placed This Encounter  Procedures  . EKG 12-Lead  . ECHOCARDIOGRAM COMPLETE   Meds ordered this encounter  Medications  . torsemide (DEMADEX) 20 MG tablet    Sig: Take 1 tablet (20 mg total) by mouth as needed (For swelling or weight gain of 3 lbs over night or 5lbs in a week).    Dispense:  30 tablet    Refill:  3    Patient Instructions  Medication Instructions:  Change torsemide from daily to as needed for weight gain of 3 lbs over night or  5 lbs in a week. Check blood pressure. If consistently low 100s on the top number, cut back to hydralazine 25 mg (1/2 tab) three times a day and monitor your blood pressure.  *If you need a refill on your cardiac medications before your next appointment, please call your pharmacy*   Lab Work: None   Testing/Procedures: Your physician has requested that you have an echocardiogram. Echocardiography is a painless test that uses sound waves to create images of your heart. It provides your doctor with information about the size and shape of your heart and how well your heart's chambers and valves are working. This procedure takes approximately one hour. There are no restrictions for this procedure. [address]   Follow-Up: At [facility], you and your health needs are our priority.  As part of our continuing mission to provide you with exceptional heart care, we have created designated Provider Care Teams.  These Care Teams include your primary Cardiologist (physician) and Advanced Practice Providers (APPs -  Physician Assistants and Nurse Practitioners) who all work together to  provide you with the care you need, when you need it.  We recommend signing up for the patient portal called "MyChart".  Sign up information is provided on this After Visit Summary.  MyChart is used to connect with patients for Virtual Visits (Telemedicine).  Patients are able to view lab/test results, encounter notes, upcoming appointments, etc.  Non-urgent messages can be sent to your provider as well.   To learn more about what you can do with MyChart, go to [email or web address].    Your next appointment:   1 month(s)  The format for your next appointment:   In Person  Provider:   Buford Dresser, MD  Do the following things EVERY DAY:  1) Weigh yourself EVERY morning after you go to the bathroom but before you eat or drink anything. Write this number down in a weight log/diary. If you gain 3 pounds overnight or 5 pounds in a week, take a dose of torsemide. If this doesn't improve after taking the torsemide for 3 days in a row, call the office.  2) Take your medicines as prescribed. If you have concerns about your medications, please call us before you stop taking them.   3) Eat low salt foods--Limit salt (sodium) to 2000 mg per day. This will help prevent your body from holding onto fluid. Read food labels as many processed foods have a lot of sodium, especially canned goods and prepackaged meats. If you would like some assistance choosing low sodium foods, we would be happy to set you up with a nutritionist.  4) Stay as active as you can everyday. Staying active will give you more energy and make your muscles stronger. Start with 5 minutes at a time and work your way up to 30 minutes a day. Break up your activities--do some in the morning and some in the afternoon. Start with 3 days per week and work your way up to 5 days as you can.  If you have chest pain, feel short of breath, dizzy, or lightheaded, STOP. If you don't feel better after a short rest, call 911. If you do feel  better, call the office to let us know you have symptoms with exercise.  5) Limit all fluids for the day to less than 2 liters. Fluid includes all drinks, coffee, juice, ice chips, soup, jello, and all other liquids.     Signed, Buford Dresser, MD PhD  05/24/2020 7:06 PM     [facility]

## 2020-05-24 NOTE — Patient Instructions (Addendum)
Medication Instructions:  Change torsemide from daily to as needed for weight gain of 3 lbs over night or 5 lbs in a week. Check blood pressure. If consistently low 100s on the top number, cut back to hydralazine 25 mg (1/2 tab) three times a day and monitor your blood pressure.  *If you need a refill on your cardiac medications before your next appointment, please call your pharmacy*   Lab Work: None   Testing/Procedures: Your physician has requested that you have an echocardiogram. Echocardiography is a painless test that uses sound waves to create images of your heart. It provides your doctor with information about the size and shape of your heart and how well your heart's chambers and valves are working. This procedure takes approximately one hour. There are no restrictions for this procedure. [address]   Follow-Up: At [facility], you and your health needs are our priority.  As part of our continuing mission to provide you with exceptional heart care, we have created designated Provider Care Teams.  These Care Teams include your primary Cardiologist (physician) and Advanced Practice Providers (APPs -  Physician Assistants and Nurse Practitioners) who all work together to provide you with the care you need, when you need it.  We recommend signing up for the patient portal called "MyChart".  Sign up information is provided on this After Visit Summary.  MyChart is used to connect with patients for Virtual Visits (Telemedicine).  Patients are able to view lab/test results, encounter notes, upcoming appointments, etc.  Non-urgent messages can be sent to your provider as well.   To learn more about what you can do with MyChart, go to [email or web address].    Your next appointment:   1 month(s)  The format for your next appointment:   In Person  Provider:   Buford Dresser, MD  Do the following things EVERY DAY:  1) Weigh yourself EVERY morning after  you go to the bathroom but before you eat or drink anything. Write this number down in a weight log/diary. If you gain 3 pounds overnight or 5 pounds in a week, take a dose of torsemide. If this doesn't improve after taking the torsemide for 3 days in a row, call the office.  2) Take your medicines as prescribed. If you have concerns about your medications, please call us before you stop taking them.   3) Eat low salt foods-Limit salt (sodium) to 2000 mg per day. This will help prevent your body from holding onto fluid. Read food labels as many processed foods have a lot of sodium, especially canned goods and prepackaged meats. If you would like some assistance choosing low sodium foods, we would be happy to set you up with a nutritionist.  4) Stay as active as you can everyday. Staying active will give you more energy and make your muscles stronger. Start with 5 minutes at a time and work your way up to 30 minutes a day. Break up your activities--do some in the morning and some in the afternoon. Start with 3 days per week and work your way up to 5 days as you can.  If you have chest pain, feel short of breath, dizzy, or lightheaded, STOP. If you don't feel better after a short rest, call 911. If you do feel better, call the office to let us know you have symptoms with exercise.  5) Limit all fluids for the day to less than 2 liters. Fluid includes all drinks, coffee,  juice, ice chips, soup, jello, and all other liquids.

## 2020-05-25 DIAGNOSIS — R5382 Chronic fatigue, unspecified: Secondary | ICD-10-CM | POA: Insufficient documentation

## 2020-05-25 DIAGNOSIS — R634 Abnormal weight loss: Secondary | ICD-10-CM | POA: Insufficient documentation

## 2020-05-25 LAB — VITAMIN B12: Vitamin B-12: 370 pg/mL (ref 232–1245)

## 2020-05-25 LAB — BASIC METABOLIC PANEL
BUN/Creatinine Ratio: 11 (ref 10–24)
BUN: 23 mg/dL (ref 8–27)
CO2: 22 mmol/L (ref 20–29)
Calcium: 9.7 mg/dL (ref 8.6–10.2)
Chloride: 100 mmol/L (ref 96–106)
Creatinine, Ser: 2.08 mg/dL — ABNORMAL HIGH (ref 0.76–1.27)
GFR calc Af Amer: 37 mL/min/{1.73_m2} — ABNORMAL LOW (ref >59)
GFR calc non Af Amer: 32 mL/min/{1.73_m2} — ABNORMAL LOW (ref >59)
Glucose: 92 mg/dL (ref 65–99)
Potassium: 3.4 mmol/L — ABNORMAL LOW (ref 3.5–5.2)
Sodium: 141 mmol/L (ref 134–144)

## 2020-05-25 LAB — TSH: TSH: 1.82 u[IU]/mL (ref 0.450–4.500)

## 2020-05-25 NOTE — Assessment & Plan Note (Signed)
Patient awaiting call back for appointment with [facility].

## 2020-05-25 NOTE — Assessment & Plan Note (Signed)
Chronic, worsening. Creatinine continues to decline.  Cr 1.81>2.08 today. GFR 37. CrCl 54.  - Referred to nephrology - Will monitor closely. Medications reviewed for safety and no changes at this time. May need to hold certain medications if continues to decline.

## 2020-05-25 NOTE — Assessment & Plan Note (Signed)
Soft blood pressure today with symptoms recently. Deferred change in management given patient has follow up appointment with cardiology today.

## 2020-05-25 NOTE — Assessment & Plan Note (Addendum)
Acute x 2-3 months with associated SOB with exertion. Has also had unexplained weight loss.  Unclear cause at this time. TSH, B12, Hep B/C, CBC, electrolytes normal. Poor kidney function - chronic and worsening. A1C 5.5. HIV negative in June 2021. Denies symptoms of further sleep apnea.  Not up to date on cancer screening.  Differential could include malignancy, advanced chronic disease, adrenal insufficiency. Will continue to work up at follow up visit. Consider hepatic function, FOBT, AM cortisol level, review nonprescription medications,

## 2020-05-25 NOTE — Assessment & Plan Note (Signed)
Referred to urology for continued monitoring.  PSA <1. Follow up with urology.

## 2020-05-25 NOTE — Addendum Note (Signed)
Addended by: Danna Hefty on: 05/25/2020 09:24 PM   Modules accepted: Orders

## 2020-05-25 NOTE — Assessment & Plan Note (Addendum)
Chronic. CHAD2VASC score = 5 (age, CHF, HTN, h/o stroke). HR 60's today. Normal cardiac exam.  - continue Metoprolol 100mg  QD and Eliquis 5mg  BID.  - follow up with cardiology as scheduled

## 2020-05-25 NOTE — Assessment & Plan Note (Signed)
No recurrent chest pain since last visit. Cardiology appointment today.

## 2020-05-25 NOTE — Assessment & Plan Note (Addendum)
Acute. Unintentional. Weight has decreased from 120lb>102lb just this month. Almost a 40lb weight loss in 6 months. Was trying to swim but has stopped doing this for about 1 month.  TSH, B12, Hep B/C, CBC, electrolytes normal. Poor kidney function - chronic and worsening. A1C 5.5. HIV negative in June 2021. Denies symptoms of further sleep apnea.  Not up to date on cancer screening.  Close follow up scheduled for continued work up. Plan as above.

## 2020-05-30 ENCOUNTER — Other Ambulatory Visit: Payer: Self-pay | Admitting: Family Medicine

## 2020-05-30 DIAGNOSIS — I712 Thoracic aortic aneurysm, without rupture, unspecified: Secondary | ICD-10-CM

## 2020-05-30 NOTE — Progress Notes (Signed)
Received message from [facility] Link Snuffer, DO  "I received this referral for this patient and before we can see patient we need a CTA Chest ordered and scheduled so that we have updated images for the surgeon to review. Thanks   Charise Killian  [phone] "  Order has been place per request.

## 2020-06-07 NOTE — Progress Notes (Signed)
Subjective:   Patient ID: Logan Robertson    DOB: 05/12/1955, 65 y.o. male   MRN: [id number]  Lillie Bollig is a 65 y.o. male with a history of ascending aorta dilatation, atrial flutter, HTN, HFrEF (25-30%), NICM, PAD, stroke, OSA, CKD3, prostate cancer, anemia, h/o angioedema with ACE-I, chronic fatigeu, ED, DOE, h/o CVA, HLD, insomnia, obesity, stress incontinence, weight loss  here for weight loss and fatigue follow up  Unexplained Weight loss: Patient returns today for follow up of weight loss. Weight loss decreased from 280lbs to 240lbs, with 40lb weight loss in last 6 months. He has been referred to GI for colonoscopy. He is s/p prostatectomy with PSA <0.1. TSH, B12, Hep B/C, CBC, electrolytes normal. Poor kidney function - chronic and worsening. A1C 5.5. HIV negative in June 2021. Denies symptoms of further sleep apnea.  He was having some soft blood pressures. Cardiology made the Torsemide as needed, last taken last week. He notes that he stopped taking the Hydralazine. He notes he hasn't been checking his blood pressures at home. Denies any worsening difficulty breathing, swelling. He weighs himself about 3-4 times per week Has gained 5 lbs since 05/24/20.  Notes energy level is about the same but the SOB has improved. Denies taking any over the counter medications or herbal supplements. Has been working on forcing himself to eat which has helped improve his appetite overall.   Review of Systems:  Per HPI.   Objective:   BP (!) 142/74   Pulse 73   Ht 6' (1.829 m)   Wt 245 lb 3.2 oz (111.2 kg)   SpO2 97%   BMI 33.26 kg/m  Vitals and nursing note reviewed.  General: pleasant older male, sitting comfortably in exam chair, well nourished, well developed, in no acute distress with non-toxic appearance CV: regular rate and rhythm, split S2, no rubs or gallops, trace lower extremity edema bilaterally, 2+ radial pulses, pedal pulses difficult to appreciate Lungs: clear to auscultation  bilaterally with normal work of breathing on room air, speaking in full sentences Skin: warm, dry MSK:  gait normal Neuro: Alert and oriented, speech normal  Assessment & Plan:   Ascending aorta dilatation (HCC) Changing CTA to CT chest without contrast due to worsening kidney function. CMP today to monitor Attempted to contact CVTS office to notify them of this change.  Essential hypertension Improved. Slightly above goal today however given improvement in symptoms of SOB, will defer changes in management. Recommended daily BP check and keep a log. If persistently >140/90, then may need to restart Hydralazine at half dose TID.  Continue remaining BP meds as prescribed   CKD (chronic kidney disease) stage 3, GFR 30-59 ml/min (HCC) Chronic, worsening. Nephrology referral placed. CMP today to monitor  Weight loss Acute, unintentional, improving. Had 5lb weight gain since last visit. Appears euvolemic on exam today. S/p prostatectomy with PSA <0.1. TSH, B12, Hep B/C, CBC, electrolytes normal. Poor kidney function (CKD IIIb) - chronic and worsening. A1C 5.5. HIV negative in June 2021. Denies symptoms of further sleep apnea.  Unclear etiology for unexplained weight loss and ddx includes malignancy vs progression of chronic disease. Other differentials considered but appear less likely include adrenal insufficieny or rheumatologic disease. Ruled out many causes already. GI referral for colonoscopy. Echo and CT chest to further evaluate. Follow up with urology and nephrology as recommended. Referrals placed. Recommended daily weights given his Torsemide is PRN. He should take this if 3lb weight gain, SOB, or swelling. Continue to eat  nutrient dense but healthy foods Limit water intake to <2L per day Follow up in 2 months for continued monitoring, sooner if worsening or concerns arise  Orders Placed This Encounter  Procedures  . CT Chest Wo Contrast    Worsening CKD    Standing Status:    Future    Standing Expiration Date:   06/08/2021    Order Specific Question:   Preferred imaging location?    Answer:   [facility]  . Comprehensive metabolic panel   No orders of the defined types were placed in this encounter.   Mina Marble, DO PGY-3, [facility] 06/08/2020 1:28 PM

## 2020-06-08 ENCOUNTER — Other Ambulatory Visit: Payer: Self-pay

## 2020-06-08 ENCOUNTER — Ambulatory Visit (INDEPENDENT_AMBULATORY_CARE_PROVIDER_SITE_OTHER): Payer: Medicare HMO | Admitting: Family Medicine

## 2020-06-08 ENCOUNTER — Encounter: Payer: Self-pay | Admitting: Family Medicine

## 2020-06-08 VITALS — BP 142/74 | HR 73 | Ht 72.0 in | Wt 245.2 lb

## 2020-06-08 DIAGNOSIS — N1832 Chronic kidney disease, stage 3b: Secondary | ICD-10-CM

## 2020-06-08 DIAGNOSIS — I77810 Thoracic aortic ectasia: Secondary | ICD-10-CM | POA: Diagnosis not present

## 2020-06-08 DIAGNOSIS — R634 Abnormal weight loss: Secondary | ICD-10-CM | POA: Diagnosis not present

## 2020-06-08 DIAGNOSIS — I712 Thoracic aortic aneurysm, without rupture, unspecified: Secondary | ICD-10-CM

## 2020-06-08 DIAGNOSIS — I10 Essential (primary) hypertension: Secondary | ICD-10-CM

## 2020-06-08 NOTE — Assessment & Plan Note (Signed)
Chronic, worsening. Nephrology referral placed. CMP today to monitor

## 2020-06-08 NOTE — Assessment & Plan Note (Addendum)
Changing CTA to CT chest without contrast due to worsening kidney function. CMP today to monitor Attempted to contact CVTS office to notify them of this change.

## 2020-06-08 NOTE — Patient Instructions (Signed)
It was a pleasure to see you today!  Thank you for choosing [facility] for your primary care.   Our plans for today were:  I am getting a CT chest to evaluate your aneurysm and your weight loss  I am also getting repeat labs to evaluate your  Please follow up with the cardiothoracic surgeon, nephrologist (kidney doctor) and the urologist (prostate/bladder doctor). I have also referred you to gastroenterology for colon cancer screening.  We are checking some labs today, I will call you if they are abnormal will send you a MyChart message or a letter if they are normal.  If you do not hear about your labs in the next 2 weeks please let us know.  BRING ALL OF YOUR MEDICATIONS WITH YOU TO EVERY VISIT   You should return to our clinic in 2 months for continued monitoring.  Best Wishes,   Mina Marble, DO

## 2020-06-08 NOTE — Assessment & Plan Note (Signed)
Improved. Slightly above goal today however given improvement in symptoms of SOB, will defer changes in management. Recommended daily BP check and keep a log. If persistently >140/90, then may need to restart Hydralazine at half dose TID.  Continue remaining BP meds as prescribed

## 2020-06-08 NOTE — Assessment & Plan Note (Addendum)
Acute, unintentional, improving. Had 5lb weight gain since last visit. Appears euvolemic on exam today. S/p prostatectomy with PSA <0.1. TSH, B12, Hep B/C, CBC, electrolytes normal. Poor kidney function (CKD IIIb) - chronic and worsening. A1C 5.5. HIV negative in June 2021. Denies symptoms of further sleep apnea.  Unclear etiology for unexplained weight loss and ddx includes malignancy vs progression of chronic disease. Other differentials considered but appear less likely include adrenal insufficieny or rheumatologic disease. Ruled out many causes already. GI referral for colonoscopy. Echo and CT chest to further evaluate. Follow up with urology and nephrology as recommended. Referrals placed. Recommended daily weights given his Torsemide is PRN. He should take this if 3lb weight gain, SOB, or swelling. Continue to eat nutrient dense but healthy foods Limit water intake to <2L per day Follow up in 2 months for continued monitoring, sooner if worsening or concerns arise

## 2020-06-09 LAB — COMPREHENSIVE METABOLIC PANEL
ALT: 17 IU/L (ref 0–44)
AST: 17 IU/L (ref 0–40)
Albumin/Globulin Ratio: 1.4 (ref 1.2–2.2)
Albumin: 4.4 g/dL (ref 3.8–4.8)
Alkaline Phosphatase: 82 IU/L (ref 44–121)
BUN/Creatinine Ratio: 11 (ref 10–24)
BUN: 15 mg/dL (ref 8–27)
Bilirubin Total: 0.6 mg/dL (ref 0.0–1.2)
CO2: 22 mmol/L (ref 20–29)
Calcium: 9.6 mg/dL (ref 8.6–10.2)
Chloride: 97 mmol/L (ref 96–106)
Creatinine, Ser: 1.41 mg/dL — ABNORMAL HIGH (ref 0.76–1.27)
GFR calc Af Amer: 60 mL/min/{1.73_m2} (ref >59)
GFR calc non Af Amer: 52 mL/min/{1.73_m2} — ABNORMAL LOW (ref >59)
Globulin, Total: 3.1 g/dL (ref 1.5–4.5)
Glucose: 86 mg/dL (ref 65–99)
Potassium: 3.9 mmol/L (ref 3.5–5.2)
Sodium: 140 mmol/L (ref 134–144)
Total Protein: 7.5 g/dL (ref 6.0–8.5)

## 2020-06-14 ENCOUNTER — Telehealth: Payer: Self-pay

## 2020-06-14 NOTE — Telephone Encounter (Signed)
Called patient to inform him that he can keep the same appointment although the order was changed.  Confirmed with Crystal at [facility].  No answer. LVM for patient.  CT Chest w/o contrast [address] [phone] with arrival at 1035 06/22/2020  .Ozella Almond, CMA

## 2020-06-14 NOTE — Telephone Encounter (Signed)
-----   Message from [facility], [place] sent at 06/08/2020  3:50 PM EST ----- CTA has been changed to CT chest without contrast due to kidney function. Order has been placed. CTA has previously been scheduled for 3/10. We will need to call [facility] imaging to determine if he can keep the same appointment for the CT chest once it has been approved by insurance.

## 2020-06-15 ENCOUNTER — Encounter (HOSPITAL_COMMUNITY): Payer: Self-pay | Admitting: Cardiology

## 2020-06-15 ENCOUNTER — Other Ambulatory Visit (HOSPITAL_COMMUNITY): Payer: Medicare HMO

## 2020-06-22 ENCOUNTER — Other Ambulatory Visit: Payer: Medicare HMO

## 2020-06-26 ENCOUNTER — Ambulatory Visit: Payer: Medicare HMO | Admitting: Cardiothoracic Surgery

## 2020-06-29 ENCOUNTER — Telehealth (HOSPITAL_COMMUNITY): Payer: Self-pay | Admitting: Cardiology

## 2020-06-29 NOTE — Telephone Encounter (Signed)
Just an FYI. We have made several attempts to contact this patient including sending a letter to schedule or reschedule their echocardiogram. We will be removing the patient from the echo [place].   06/15/20 NO SHOWED -MAILED LETTER [facility]     Thank you

## 2020-06-30 ENCOUNTER — Encounter: Payer: Self-pay | Admitting: Cardiology

## 2020-06-30 ENCOUNTER — Other Ambulatory Visit: Payer: Self-pay

## 2020-06-30 ENCOUNTER — Ambulatory Visit (INDEPENDENT_AMBULATORY_CARE_PROVIDER_SITE_OTHER): Payer: Medicare HMO | Admitting: Cardiology

## 2020-06-30 VITALS — BP 130/77 | HR 62 | Ht 72.0 in | Wt 242.0 lb

## 2020-06-30 DIAGNOSIS — I4892 Unspecified atrial flutter: Secondary | ICD-10-CM

## 2020-06-30 DIAGNOSIS — Z8673 Personal history of transient ischemic attack (TIA), and cerebral infarction without residual deficits: Secondary | ICD-10-CM

## 2020-06-30 DIAGNOSIS — E7800 Pure hypercholesterolemia, unspecified: Secondary | ICD-10-CM

## 2020-06-30 DIAGNOSIS — I428 Other cardiomyopathies: Secondary | ICD-10-CM

## 2020-06-30 DIAGNOSIS — I77810 Thoracic aortic ectasia: Secondary | ICD-10-CM

## 2020-06-30 DIAGNOSIS — I10 Essential (primary) hypertension: Secondary | ICD-10-CM

## 2020-06-30 NOTE — Progress Notes (Signed)
Cardiology Office Note:    Date:  06/30/2020   ID:  Logan Robertson, DOB September 24, 1955, MRN [id number]  PCP:  Danna Hefty, DO  Cardiologist:  Buford Dresser, MD  CC: follow up  History of Present Illness:    Logan Robertson is a 65 y.o. male with a hx of cardiomyopathy, atrial flutter, hypertension, hyperlipidemia, OSA, PAD, history of CVA, chronic kidney disease stage 3 who is seen for follow up today. I initially met him during his hospitalization 09/2019.  Today: Breathing better, sleeping better. Less lightheadedness, no presyncope or syncope. Not taking hydralazine, removed from list. No swelling, able to walk. Has not required the torsemide. Has not gotten echo ordered from last visit.   Weight has stabilized, appetite improving.  Had worsening kidney function, recent recheck improved, GFR 60.  Irregular rhythm today, two ecgs performed, see below.  Denies chest pain, shortness of breath at rest or with normal exertion. No PND, orthopnea, LE edema or unexpected weight gain. No syncope or palpitations.   Past Medical History:  Diagnosis Date   Hypertension    Lyme disease    Myocardial infarction [facility])    Sleep apnea     Past Surgical History:  Procedure Laterality Date   RIGHT/LEFT HEART CATH AND CORONARY ANGIOGRAPHY N/A 09/30/2019   Procedure: RIGHT/LEFT HEART CATH AND CORONARY ANGIOGRAPHY;  Surgeon: Jettie Booze, MD;  Location: [facility];  Service: Cardiovascular;  Laterality: N/A;    Current Medications: Current Outpatient Medications on File Prior to Visit  Medication Sig   acetaminophen (TYLENOL) 325 MG tablet Take 650 mg by mouth every 6 (six) hours as needed for moderate pain.    apixaban (ELIQUIS) 5 MG TABS tablet Take 1 tablet (5 mg total) by mouth 2 (two) times daily.   atorvastatin (LIPITOR) 80 MG tablet Take 1 tablet (80 mg total) by mouth daily.   empagliflozin (JARDIANCE) 10 MG TABS tablet Take 10 mg by mouth daily.    hydrALAZINE (APRESOLINE) 50 MG tablet Take 1 tablet (50 mg total) by mouth 3 (three) times daily. (Patient taking differently: Take 50 mg by mouth 3 (three) times daily. HOLD PER HEART DOCTOR)   metoprolol succinate (TOPROL-XL) 100 MG 24 hr tablet Take 1 tablet (100 mg total) by mouth daily. Take with or immediately following a meal.   spironolactone (ALDACTONE) 25 MG tablet Take 1 tablet (25 mg total) by mouth at bedtime.   torsemide (DEMADEX) 20 MG tablet Take 1 tablet (20 mg total) by mouth as needed (For swelling or weight gain of 3 lbs over night or 5lbs in a week).   valsartan (DIOVAN) 320 MG tablet Take 320 mg by mouth daily.   No current facility-administered medications on file prior to visit.     Allergies:   Lisinopril   Social History   Tobacco Use   Smoking status: Never Smoker   Smokeless tobacco: Never Used  Substance Use Topics   Alcohol use: Not Currently   Drug use: Not Currently    Family History: No significant FH of heart disease  ROS:   Please see the history of present illness.  Additional pertinent ROS otherwise unremarkable.   EKGs/Labs/Other Studies Reviewed:    The following studies were reviewed today: Cath 7/61/60 LV end diastolic pressure is normal. There is no aortic valve stenosis. Hemodynamic findings consistent with mild pulmonary hypertension. Ao sat 97%, PA sat 69%, PA pressure 35/20, mean PA 26 mm Hg, mean PCWP 16 mm Hg; CO 6.9 L/min;  CI 2.82 No angiographically apparent coronary artery disease. Due to severe right subclavian tortuosity, we switched to the femoral approach. Would not attempt right radial approach in the future.   Continue medical therapy.  Patient with intermittent atrial flutter during the case.  He would convert back to NSR spontaneously, and once with IV metoprolol.   Echo 09/29/19 1. Left ventricular ejection fraction, by estimation, is 35 to 40%. The  left ventricle has moderately decreased function. The left  ventricle  demonstrates global hypokinesis. There is mild concentric left ventricular  hypertrophy. Left ventricular  diastolic function could not be evaluated.   2. Right ventricular systolic function is normal. The right ventricular  size is normal. Tricuspid regurgitation signal is inadequate for assessing  PA pressure.   3. The mitral valve is normal in structure. Moderate mitral annular  calcification of the posterior mitral valve annulus .No evidence of mitral  valve regurgitation. No evidence of mitral stenosis.   4. The aortic valve is tricuspid. Aortic valve regurgitation is moderate.  Mild aortic valve sclerosis is present, with no evidence of aortic valve  stenosis. Aortic regurgitation PHT measures 329 msec.   5. Aortic dilatation noted. There is mild dilatation at the level of the  sinuses of Valsalva and severe dilatation of the ascending aorta measuring  43 mm and 23m respectively.   6. The inferior vena cava is normal in size with greater than 50%  respiratory variability, suggesting right atrial pressure of 3 mmHg.   7. Left atrial size was severely dilated.    Nuclear stress test 07/2016 IMPRESSION:      1.  A large in size, mild intensity, basal to distal inferior wall    attenuation is somewhat more intense and extends to   the infero-apex at rest.  Otherwise normal perfusion throughout.     Prone images reveal a small to moderate in size,   severe intensity, infero-apical-lateral and distal infero-lateral and    apical-lateral and a small in size, moderate   intensity, basal inferior wall defect with normal mid to distal    inferior wall perfusion.      2. Normal regadenoson ECG stress test.      3. Normal left ventricular systolic function and wall motion    post-stress, including the inferior and infero-apical   walls.      CONCLUSION - PROBABLE INFERIOR WALL ATTENUATION ARTIFACT.  NO    DEFINITE AREAS OF MYOCARDIAL ISCHEMIA OF INFARCTION.     Echo  08/12/2016 Mitral Valve:   There is mild mitral annular calcification. No mitral valve regurgitation is detected.  Aortic Valve:   The aortic valve is trileaflet. Leaflet thickness and mobility are normal. No aortic stenosis is detected. No aortic regurgitation is detected.  Tricuspid Leaflet thickness and mobility are normal. Trace  tricuspid Valve regurgitation is detected. The estimated pulmonary artery systolic pressure is normal.  Pulmonic Valve: The pulmonic valve is not well visualized, but is grossly normal.   Left Atrium: There is borderline left atrial enlargement.   Left Ventricle: Left ventricular cavity size is normal. Mild  concentric hypertrophy is detected. No regional wall motion abnormalities are detected. Left ventricular systolic function is normal (>55%). Tissue Doppler imaging demonstrates a lateral wall E' that is abnormal for patient's age and suggests diastolic dysfunction.  Tissue Doppler imaging demonstrates an E/E' ratio that is  8-15, which is indeterminant for the estimation of left  sided  filling pressures.   Right Atrium: The right atrium is  normal in size.   Right  Ventricle: Normal right ventricular cavity size and systolic function.   Pericardium:  There is no pericardial effusion.   Pleura: No significant pleural effusion is detected.   Miscellaneous:The IVC is less than 2.1 cm and is > 50% collapsible consistent with a right atrial pressure of  approximately 3 mmHg. The aortic root diameter is normal.    Conclusions  Summary  Left ventricular systolic function is normal (>55%).  Tissue Doppler imaging demonstrates a lateral wall E' that is  abnormal for patient's age and suggests diastolic dysfunction     ABI 10/01/2019   ABI Findings:  +---------+------------------+-----+----------+--------+  Right    Rt Pressure (mmHg)IndexWaveform  Comment   +---------+------------------+-----+----------+--------+  Brachial 190                     triphasic           +---------+------------------+-----+----------+--------+  ATA      138               0.73 biphasic            +---------+------------------+-----+----------+--------+  PTA      138               0.73 monophasic          +---------+------------------+-----+----------+--------+  Great Toe116               0.61 Abnormal            +---------+------------------+-----+----------+--------+   +---------+------------------+-----+---------+-------+  Left     Lt Pressure (mmHg)IndexWaveform Comment  +---------+------------------+-----+---------+-------+  Brachial 184                    triphasic         +---------+------------------+-----+---------+-------+  ATA      130               0.68 biphasic          +---------+------------------+-----+---------+-------+  PTA      129               0.68 biphasic          +---------+------------------+-----+---------+-------+  [place]               0.62 Abnormal          +---------+------------------+-----+---------+-------+  Summary:  Right: Resting right ankle-brachial index indicates moderate right lower  extremity arterial disease. The right toe-brachial index is abnormal.   Left: Resting left ankle-brachial index indicates moderate left lower  extremity arterial disease. The left toe-brachial index is abnormal  EKG:  EKG is personally reviewed.  The ekg ordered today demonstrates  atrial fib/flutter at 123 BPM, then ECG 2 shows likely sinus tach vs. Atypical flutter with PVCs at 101 bpm  Recent Labs: 09/29/2019: Magnesium 1.7 12/06/2019: B Natriuretic Peptide 495.5 05/16/2020: Hemoglobin 14.3; Platelets 218 05/24/2020: TSH 1.820 06/08/2020: ALT 17; BUN 15; Creatinine, Ser 1.41; Potassium 3.9; Sodium 140  Recent Lipid Panel    Component Value Date/Time   CHOL 196 09/29/2019 0437   TRIG 98 09/29/2019 0437   HDL 42 09/29/2019 0437   CHOLHDL 4.7 09/29/2019 0437   VLDL 20  09/29/2019 0437   LDLCALC 134 (H) 09/29/2019 0437    Physical Exam:    VS:  BP 130/77   Pulse 62   Ht 6' (1.829 m)   Wt 242 lb (109.8 kg)   SpO2 96%   BMI 32.82 kg/m  Wt Readings from Last 3 Encounters:  06/30/20 242 lb (109.8 kg)  06/08/20 245 lb 3.2 oz (111.2 kg)  05/24/20 239 lb 3.2 oz (108.5 kg)    GEN: Well nourished, well developed in no acute distress HEENT: Normal, moist mucous membranes NECK: No JVD CARDIAC: irregularly irregular rhythm, normal S1 and S2, no rubs or gallops. No murmur. VASCULAR: Radial and DP pulses 2+ bilaterally. No carotid bruits RESPIRATORY:  Clear to auscultation without rales, wheezing or rhonchi  ABDOMEN: Soft, non-tender, non-distended MUSCULOSKELETAL:  Ambulates independently SKIN: Warm and dry, no edema NEUROLOGIC:  Alert and oriented x 3. No focal neuro deficits noted. PSYCHIATRIC:  Normal affect    ASSESSMENT:    1. NICM (nonischemic cardiomyopathy) (HCC)   2. Paroxysmal atrial flutter ([place])   3. History of CVA (cerebrovascular accident)   4. Pure hypercholesterolemia   5. Essential hypertension   6. Ascending aorta dilatation (HCC)    PLAN:    Irregular rhythm on exam Paroxysmal atrial flutter -continue apixaban CHA2DS2/VAS Stroke Risk Points = 5  -we discussed today. He feels remarkably well. Discussed cardioversion, though appears he may go in/out. Discussed monitor, Kardiamobile. After shared decision making, he will check with a pulse ox at home and let me know if HR remains elevated -counseled on red flag warning signs that need immediate medical attention. -continue metoprolol succinate  Nonischemic cardiomyopathy Hypertension -euvolemic today. On torsemide PRN -not taking hydralazine any longer, BP well controlled on meds below -continue metoprolol succinate, empagliflozin, valsartan, spironolactone -no CAD on cath -has repeat echo ordered, has not scheduled yet  History of ? CVA in 2010 (no  records) PAD Hypercholesterolemia -LDL goal <70 -last LDL 134 in 2021 -declines labs today  Thoracic aortic aneurysm, ascending: -echo 09/29/19 with measurement of 52 mm -seen by Dr. Orvan Seen 11/2019 -we have discussed imaging with CT monitoring. He has this ordered with his PCP for next month  Aortic regurgitation: -moderate on last echo, follow given TAA/dilation as likely etiology  Cardiac risk counseling and prevention recommendations: -recommend heart healthy/Mediterranean diet, with whole grains, fruits, vegetable, fish, lean meats, nuts, and olive oil. Limit salt. -recommend moderate walking, 3-5 times/week for 30-50 minutes each session. Aim for at least 150 minutes.week. Goal should be pace of 3 miles/hours, or walking 1.5 miles in 30 minutes -recommend avoidance of tobacco products. Avoid excess alcohol.  Plan for follow up: 3 mos if his heart rate returns to his baseline, urgent follow up sooner as needed  Buford Dresser, MD, PhD, [facility]   Medication Adjustments/Labs and Tests Ordered: Current medicines are reviewed at length with the patient today.  Concerns regarding medicines are outlined above.  Orders Placed This Encounter  Procedures   EKG 12-Lead   No orders of the defined types were placed in this encounter.   Patient Instructions  Medication Instructions:  Your Physician recommend you continue on your current medication as directed.    Get a pulse oximeter if you can--you put it on your finger and it tracks your heart rate. If you see fast heart rates (consistently more than 100 bpm while sitting still), please call me and we will adjust your medications. There is an oxygen number and a heart rate number on most pulse oximeters--make sure you are tracking the pulse (heart rate) as it can be confusing!  *If you need a refill on your cardiac medications before your next appointment, please call your pharmacy*   Lab  Work: None  Testing/Procedures: None   Follow-Up: At [facility], you and your health needs are our priority.  As part of our continuing mission to provide you with exceptional heart care, we have created designated Provider Care Teams.  These Care Teams include your primary Cardiologist (physician) and Advanced Practice Providers (APPs -  Physician Assistants and Nurse Practitioners) who all work together to provide you with the care you need, when you need it.  We recommend signing up for the patient portal called "MyChart".  Sign up information is provided on this After Visit Summary.  MyChart is used to connect with patients for Virtual Visits (Telemedicine).  Patients are able to view lab/test results, encounter notes, upcoming appointments, etc.  Non-urgent messages can be sent to your provider as well.   To learn more about what you can do with MyChart, go to [email or web address].    Your next appointment:   3 month(s)  The format for your next appointment:   In Person  Provider:   Buford Dresser, MD    Signed, Buford Dresser, MD PhD 06/30/2020     [place]

## 2020-06-30 NOTE — Patient Instructions (Addendum)
Medication Instructions:  Your Physician recommend you continue on your current medication as directed.    Get a pulse oximeter if you can--you put it on your finger and it tracks your heart rate. If you see fast heart rates (consistently more than 100 bpm while sitting still), please call me and we will adjust your medications. There is an oxygen number and a heart rate number on most pulse oximeters--make sure you are tracking the pulse (heart rate) as it can be confusing!  *If you need a refill on your cardiac medications before your next appointment, please call your pharmacy*   Lab Work: None   Testing/Procedures: None   Follow-Up: At [facility], you and your health needs are our priority.  As part of our continuing mission to provide you with exceptional heart care, we have created designated Provider Care Teams.  These Care Teams include your primary Cardiologist (physician) and Advanced Practice Providers (APPs -  Physician Assistants and Nurse Practitioners) who all work together to provide you with the care you need, when you need it.  We recommend signing up for the patient portal called "MyChart".  Sign up information is provided on this After Visit Summary.  MyChart is used to connect with patients for Virtual Visits (Telemedicine).  Patients are able to view lab/test results, encounter notes, upcoming appointments, etc.  Non-urgent messages can be sent to your provider as well.   To learn more about what you can do with MyChart, go to [email or web address].    Your next appointment:   3 month(s)  The format for your next appointment:   In Person  Provider:   Buford Dresser, MD

## 2020-07-07 ENCOUNTER — Inpatient Hospital Stay: Admission: RE | Admit: 2020-07-07 | Payer: Medicare HMO | Source: Ambulatory Visit

## 2020-07-13 ENCOUNTER — Other Ambulatory Visit: Payer: Self-pay | Admitting: Cardiothoracic Surgery

## 2020-07-13 ENCOUNTER — Other Ambulatory Visit: Payer: Self-pay

## 2020-07-13 ENCOUNTER — Ambulatory Visit: Payer: Medicare HMO | Admitting: Cardiothoracic Surgery

## 2020-07-13 VITALS — BP 116/81 | HR 72 | Resp 20 | Ht 72.0 in | Wt 243.0 lb

## 2020-07-13 DIAGNOSIS — I712 Thoracic aortic aneurysm, without rupture: Secondary | ICD-10-CM

## 2020-07-13 DIAGNOSIS — I7121 Aneurysm of the ascending aorta, without rupture: Secondary | ICD-10-CM

## 2020-07-13 NOTE — Progress Notes (Signed)
Chief complaint: Shortness of breath and dyspnea on exertion   History of present illness: 65 year old man returns for evaluation of aneurysm.  He was last seen in this office last August for similar symptoms.  The recommendation at that time was for CT scan of the chest to follow-up echocardiogram which had demonstrated a root aneurysm.  No further studies have been done.  In the meantime, the patient has had increased symptoms of fatigue and shortness of breath.  He denies weight gain or peripheral edema.  As a review, he had undergone left heart catheterization which did not demonstrate any coronary obstruction.  He had moderate to severe aortic valve regurgitation and reduced EF along with atrial dysrhythmias.  Active Ambulatory Problems    Diagnosis Date Noted  . Atrial flutter ([place]) 09/28/2019  . Heart failure with reduced ejection fraction ([place]) 10/06/2019  . Peripheral arterial disease ([place]) 10/06/2019  . NICM (nonischemic cardiomyopathy) ([place]) 10/06/2019  . Essential hypertension 10/06/2019  . CKD (chronic kidney disease) stage 3, GFR 30-59 ml/min (HCC) 10/06/2019  . Paroxysmal atrial flutter ([place]) 10/06/2019  . Ascending aorta dilatation (HCC) 10/06/2019  . Hyperlipidemia 10/06/2019  . Anemia 08/12/2018  . Angioedema due to angiotensin converting enzyme inhibitor (ACE-I) 12/13/2019  . Erectile dysfunction 11/29/2019  . Obesity 03/18/2019  . Prostate cancer ([place]) 05/23/2019  . Stress incontinence of urine 05/25/2019  . Stroke ([employer]) 12/13/2019  . Sleep apnea 05/16/2020  . Insomnia 05/16/2020  . DOE (dyspnea on exertion) 05/16/2020  . History of CVA (cerebrovascular accident) 05/24/2020  . Weight loss 05/25/2020  . Chronic fatigue 05/25/2020   Resolved Ambulatory Problems    Diagnosis Date Noted  . Acute on chronic systolic congestive heart failure ([place])   . Cough 05/25/2018  . Prediabetes 05/16/2020  . Chest pain 05/16/2020  . Pure hypercholesterolemia 05/24/2020   Past  Medical History:  Diagnosis Date  . Hypertension   . Lyme disease   . Myocardial infarction [facility])     Current Outpatient Medications on File Prior to Visit  Medication Sig Dispense Refill  . acetaminophen (TYLENOL) 325 MG tablet Take 650 mg by mouth every 6 (six) hours as needed for moderate pain.     Marland Kitchen atorvastatin (LIPITOR) 80 MG tablet Take 1 tablet (80 mg total) by mouth daily. 90 tablet 2  . empagliflozin (JARDIANCE) 10 MG TABS tablet Take 10 mg by mouth daily.    Marland Kitchen spironolactone (ALDACTONE) 25 MG tablet Take 1 tablet (25 mg total) by mouth at bedtime. 90 tablet 3  . torsemide (DEMADEX) 20 MG tablet Take 1 tablet (20 mg total) by mouth as needed (For swelling or weight gain of 3 lbs over night or 5lbs in a week). 30 tablet 3  . valsartan (DIOVAN) 320 MG tablet Take 320 mg by mouth daily.    Marland Kitchen apixaban (ELIQUIS) 5 MG TABS tablet Take 1 tablet (5 mg total) by mouth 2 (two) times daily. 60 tablet 3  . metoprolol succinate (TOPROL-XL) 100 MG 24 hr tablet Take 1 tablet (100 mg total) by mouth daily. Take with or immediately following a meal. 30 tablet 2   No current facility-administered medications on file prior to visit.    Physical exam: BP 116/81   Pulse 72   Resp 20   Ht 6' (1.829 m)   Wt 110.2 kg   SpO2 96% Comment: RA  BMI 32.96 kg/m    Chronically ill-appearing man in no acute distress HEENT: Normocephalic atraumatic midline trachea no JVD no carotid  bruits Chest clear to auscultation bilaterally Heart irregularly irregular, 2/6 diastolic murmur at the apex Extremities: Mild edema warm peripherally Abdomen soft nontender  Imaging: No new studies  Impression: 65 year old man with suggestion of aortic regurgitation and aneurysm from nearly a year ago.  He needs new studies and is scheduled for CT chest in 2 weeks.  Otherwise, it is unclear if there is anything that we can offer him  Plan: Echocardiogram in the near future-ordered today Follow-up CT chest Follow-up  in this office in 1 month  Channah Godeaux Z. Orvan Seen, [place]

## 2020-07-14 ENCOUNTER — Telehealth (HOSPITAL_COMMUNITY): Payer: Self-pay | Admitting: Radiology

## 2020-07-14 NOTE — Telephone Encounter (Signed)
Called Mr Rog to offer him an echocardiogram appt for 4/12 @915 . He will check with transportation and call back.

## 2020-07-20 ENCOUNTER — Ambulatory Visit (HOSPITAL_COMMUNITY): Payer: Medicare HMO

## 2020-07-20 ENCOUNTER — Telehealth: Payer: Self-pay

## 2020-07-20 ENCOUNTER — Encounter (HOSPITAL_COMMUNITY): Payer: Self-pay

## 2020-07-20 ENCOUNTER — Other Ambulatory Visit: Payer: Self-pay

## 2020-07-20 ENCOUNTER — Ambulatory Visit (INDEPENDENT_AMBULATORY_CARE_PROVIDER_SITE_OTHER): Payer: Medicare HMO | Admitting: Internal Medicine

## 2020-07-20 DIAGNOSIS — I5020 Unspecified systolic (congestive) heart failure: Secondary | ICD-10-CM | POA: Diagnosis not present

## 2020-07-20 DIAGNOSIS — I4892 Unspecified atrial flutter: Secondary | ICD-10-CM | POA: Diagnosis not present

## 2020-07-20 DIAGNOSIS — Z8673 Personal history of transient ischemic attack (TIA), and cerebral infarction without residual deficits: Secondary | ICD-10-CM | POA: Diagnosis not present

## 2020-07-20 DIAGNOSIS — N1832 Chronic kidney disease, stage 3b: Secondary | ICD-10-CM

## 2020-07-20 DIAGNOSIS — I739 Peripheral vascular disease, unspecified: Secondary | ICD-10-CM

## 2020-07-20 DIAGNOSIS — I77810 Thoracic aortic ectasia: Secondary | ICD-10-CM

## 2020-07-20 DIAGNOSIS — G4733 Obstructive sleep apnea (adult) (pediatric): Secondary | ICD-10-CM

## 2020-07-20 MED ORDER — METOPROLOL SUCCINATE ER 200 MG PO TB24: 200.0000 mg | ORAL_TABLET | Freq: Every day | ORAL | 1 refills | Status: AC

## 2020-07-20 NOTE — Progress Notes (Signed)
Cardiology Office Note:    Date:  07/20/2020   ID:  Logan Robertson, DOB January 28, 1956, MRN [id number]  PCP:  Danna Hefty, DO   [place]  Cardiologist:  Buford Dresser, MD  Advanced Practice Provider:  No care team member to display Electrophysiologist:  None       Referring MD: Danna Hefty, DO   CC:  DOD- Tachycardia  History of Present Illness:    Logan Robertson is a 65 y.o. male with a hx of Atrial Flutter, HFrEF last at 35-40%, HTN, HLD, OSA unclear if actually on CPAP, PAD, prior CVA, CKD Stage IIIb, Aortic Aneurysm with root dilation NOS (with prior associated AI- severe by echo but found to be better by CT at Dr. Orvan Seen 07/13/20; unable to pull up CT) who presented 07/20/20.  Was planned for echocardiogram and found to have heart rate in 140s.  Patient notes that he is feeling a bit short of breat.  Has had no chest pain, chest pressure, chest tightness, chest stinging.  Notes a bit of DOE with walking; including walking to the bed for the echo Denies SOB at rest.  No PND or orthopnea.  No bendopnea, weight gain, leg swelling,  No change in his abdominal swelling.  No syncope or near syncope . Can feel that he is in funny heart rhythm.  Notes that he had bad experience in the ED in prior evaluations and does not want to go back.  Notes that he has not missed any of his medications.  Brings his medication in his bags and notes only change is hydralazine 50 mg is now BID.  Takes metoprolol succinate 100 mg PO Daily.  Ambulatory BP not done..   Past Medical History:  Diagnosis Date  . Hypertension   . Lyme disease   . Myocardial infarction ([place])   . Sleep apnea     Past Surgical History:  Procedure Laterality Date  . RIGHT/LEFT HEART CATH AND CORONARY ANGIOGRAPHY N/A 09/30/2019   Procedure: RIGHT/LEFT HEART CATH AND CORONARY ANGIOGRAPHY;  Surgeon: Jettie Booze, MD;  Location: [facility];  Service: Cardiovascular;   Laterality: N/A;    Current Medications: Current Meds  Medication Sig  . metoprolol (TOPROL XL) 200 MG 24 hr tablet Take 1 tablet (200 mg total) by mouth daily.     Allergies:   Lisinopril   Social History   Socioeconomic History  . Marital status: Single    Spouse name: Not on file  . Number of children: Not on file  . Years of education: Not on file  . Highest education level: Not on file  Occupational History  . Not on file  Tobacco Use  . Smoking status: Never Smoker  . Smokeless tobacco: Never Used  Substance and Sexual Activity  . Alcohol use: Not Currently  . Drug use: Not Currently  . Sexual activity: Not on file  Other Topics Concern  . Not on file  Social History Narrative  . Not on file   Social Determinants of Health   Financial Resource Strain: Not on file  Food Insecurity: Not on file  Transportation Needs: Not on file  Physical Activity: Not on file  Stress: Not on file  Social Connections: Not on file     Family History: No Fx of HF or AF  ROS:   Please see the history of present illness.     All other systems reviewed and are negative.  EKGs/Labs/Other Studies Reviewed:  The following studies were reviewed today:   EKG:  EKG is  ordered today.  The ekg ordered today demonstrates  07/20/20: AF RVR with [place] Beats and PVCs; rate 136; second similar with rates 144  Transthoracic Echocardiogram: Date:  Results:  Severe AA dilation by Echo, LA is not grossly dilated IMPRESSIONS  1. Left ventricular ejection fraction, by estimation, is 35 to 40%. The  left ventricle has moderately decreased function. The left ventricle  demonstrates global hypokinesis. There is mild concentric left ventricular  hypertrophy. Left ventricular  diastolic function could not be evaluated.  2. Right ventricular systolic function is normal. The right ventricular  size is normal. Tricuspid regurgitation signal is inadequate for assessing  PA pressure.  3.  The mitral valve is normal in structure. Moderate mitral annular  calcification of the posterior mitral valve annulus .No evidence of mitral  valve regurgitation. No evidence of mitral stenosis.  4. The aortic valve is tricuspid. Aortic valve regurgitation is moderate.  Mild aortic valve sclerosis is present, with no evidence of aortic valve  stenosis. Aortic regurgitation PHT measures 329 msec.  5. Aortic dilatation noted. There is mild dilatation at the level of the  sinuses of Valsalva and severe dilatation of the ascending aorta measuring  43 mm and 39mm respectively.  6. The inferior vena cava is normal in size with greater than 50%  respiratory variability, suggesting right atrial pressure of 3 mmHg.  7. Left atrial size was severely dilated.    Left/Right Heart Catheterizations: Date: 09/30/2019 Results:  LV end diastolic pressure is normal.  There is no aortic valve stenosis.  Hemodynamic findings consistent with mild pulmonary hypertension.  Ao sat 97%, PA sat 69%, PA pressure 35/20, mean PA 26 mm Hg, mean PCWP 16 mm Hg; CO 6.9 L/min; CI 2.82  No angiographically apparent coronary artery disease.  Due to severe right subclavian tortuosity, we switched to the femoral approach. Would not attempt right radial approach in the future.   Continue medical therapy.  Patient with intermittent atrial flutter during the case.  He would convert back to NSR spontaneously, and once with IV metoprolol.    Recent Labs: 09/29/2019: Magnesium 1.7 12/06/2019: B Natriuretic Peptide 495.5 05/16/2020: Hemoglobin 14.3; Platelets 218 05/24/2020: TSH 1.820 06/08/2020: ALT 17; BUN 15; Creatinine, Ser 1.41; Potassium 3.9; Sodium 140  Recent Lipid Panel    Component Value Date/Time   CHOL 196 09/29/2019 0437   TRIG 98 09/29/2019 0437   HDL 42 09/29/2019 0437   CHOLHDL 4.7 09/29/2019 0437   VLDL 20 09/29/2019 0437   LDLCALC 134 (H) 09/29/2019 0437     Risk Assessment/Calculations:     CHA2DS2-VASc Score = 6  This indicates a 9.7% annual risk of stroke. The patient's score is based upon: CHF History: Yes HTN History: Yes Diabetes History: No Stroke History: Yes Vascular Disease History: Yes Age Score: 1 Gender Score: 0      Physical Exam:    VS:   HR 140 BP 130s/70s RR 22  Wt Readings from Last 3 Encounters:  07/13/20 243 lb (110.2 kg)  06/30/20 242 lb (109.8 kg)  06/08/20 245 lb 3.2 oz (111.2 kg)    GEN: Obese male in mild disress HEENT: Normal NECK: No JVD; No carotid bruits LYMPHATICS: No lymphadenopathy CARDIAC: irregular tachycardia, no murmurs, rubs, gallops RESPIRATORY:  Clear to auscultation without rales, wheezing or rhonchi  ABDOMEN: Soft, non-tender, distended with dullness to percussion MUSCULOSKELETAL:  No edema; No deformity  SKIN: Warm and dry  NEUROLOGIC:  Alert and oriented x 3 PSYCHIATRIC:  Normal affect   ASSESSMENT:    1. Paroxysmal atrial flutter ([place])   2. Heart failure with reduced ejection fraction ([place])   3. Peripheral arterial disease ([place])   4. History of CVA (cerebrovascular accident)   5. Atrial flutter, unspecified type ([place])   6. Ascending aorta dilatation (HCC)   7. Stage 3b chronic kidney disease ([place])   8. OSA (obstructive sleep apnea)    PLAN:    In order of problems listed above:  Paroxysmal Atrial Flutter  HFrEF 35-40%- Stag B NYHA II HTN HLD OSA unclear if actually on CPAP PAD Hx CVA,  CKD Stage IIIb,  Aortic Aneurysm - discussed that ideals given sx AFl RVR and no missed doses of AC we would bring patient over to ED and do DCCV; Patient defers, citing issues he has had in the ED at times - discussed red flag symptoms for ED and EMS Eval - will schedule for time sensitive DCCV - short term will increase metoprolol succinate to 200 mg PO Daily; patient to check BP and call us with result - continue DOAC - no change to Torsemide or SGLT2i, thought volume is a bit up - no change to diovan; low  threshold to hold hydralazine if hypotension on increase BB - will need follow up Echo given that we have paused it for this appointment - reaching out to primary cardiologist  Shared Decision Making/Informed Consent The risks (stroke, cardiac arrhythmias rarely resulting in the need for a temporary or permanent pacemaker, skin irritation or burns and complications associated with conscious sedation including aspiration, arrhythmia, respiratory failure and death), benefits (restoration of normal sinus rhythm) and alternatives of a direct current cardioversion were explained in detail to Mr. Carpenter and he agrees to proceed.          Medication Adjustments/Labs and Tests Ordered: Current medicines are reviewed at length with the patient today.  Concerns regarding medicines are outlined above.  Orders Placed This Encounter  Procedures  . EKG 12-Lead   Meds ordered this encounter  Medications  . metoprolol (TOPROL XL) 200 MG 24 hr tablet    Sig: Take 1 tablet (200 mg total) by mouth daily.    Dispense:  90 tablet    Refill:  1    There are no Patient Instructions on file for this visit.   Signed, Werner Lean, MD  07/20/2020 1:28 PM    [facility]

## 2020-07-20 NOTE — Patient Instructions (Addendum)
Medication Instructions:  Your physician has recommended you make the following change in your medication:  INCREASE: metoprolol succinate (Toprol XL)  to 200 mg by mouth daily  Dr. Gasper Sells would like for you to hold your hydralazine if your BP is low *If you need a refill on your cardiac medications before your next appointment, please call your pharmacy*   Lab Work: NONE If you have labs (blood work) drawn today and your tests are completely normal, you will receive your results only by: Marland Kitchen MyChart Message (if you have MyChart) OR . A paper copy in the mail If you have any lab test that is abnormal or we need to change your treatment, we will call you to review the results.   Testing/Procedures: Your physician has recommended that you have a Cardioversion (DCCV). Electrical Cardioversion uses a jolt of electricity to your heart either through paddles or wired patches attached to your chest. This is a controlled, usually prescheduled, procedure. Defibrillation is done under light anesthesia in the hospital, and you usually go home the day of the procedure. This is done to get your heart back into a normal rhythm. You are not awake for the procedure. Please see the instruction sheet given.  Due to recent COVID-19 restrictions implemented by our local and state authorities and in an effort to keep both patients and staff as safe as possible, our hospital system requires COVID-19 testing prior to certain scheduled hospital procedures.  Please go to [place]. [place], [place] [address] on July 28, 2020 at 1:10PM  .  This is a drive up testing site.  You will not need to exit your vehicle.  You will not be billed at the time of testing but may receive a bill later depending on your insurance. You must agree to self-quarantine from the time of your testing until the procedure date on July 31, 2020.  This should included staying home with ONLY the people you live with.  Avoid take-out,  grocery store shopping or leaving the house for any non-emergent reason.  Failure to have your COVID-19 test done on the date and time you have been scheduled will result in cancellation of your procedure.  Please call our office at [phone] if you have any questions.      Follow-Up: At [facility], you and your health needs are our priority.  As part of our continuing mission to provide you with exceptional heart care, we have created designated Provider Care Teams.  These Care Teams include your primary Cardiologist (physician) and Advanced Practice Providers (APPs -  Physician Assistants and Nurse Practitioners) who all work together to provide you with the care you need, when you need it.

## 2020-07-20 NOTE — Telephone Encounter (Signed)
Called patient I reviewed DCCV instructions with him. Scheduled for 07/31/20 with Dr. Stanford Breed.  I printed out instructions and left at front desk for patient to pick up.

## 2020-07-20 NOTE — Progress Notes (Unsigned)
Logan Robertson found to be in A-fib with RVR upon arriving for echo exam.  These findings were discussed with Dr. Whitney Post, DOD.  Per DOD, Echo cancelled, patient evaluated and scheduled for Cardioversion.  He is stable at time of release and will await scheduling instructions from RN.    Deliah Boston, RDCS

## 2020-07-24 ENCOUNTER — Other Ambulatory Visit: Payer: Self-pay

## 2020-07-24 ENCOUNTER — Ambulatory Visit
Admission: RE | Admit: 2020-07-24 | Discharge: 2020-07-24 | Disposition: A | Discharge status: Home / Self Care | Payer: Medicare HMO | Source: Ambulatory Visit | Attending: Family Medicine | Admitting: Family Medicine

## 2020-07-24 DIAGNOSIS — I712 Thoracic aortic aneurysm, without rupture, unspecified: Secondary | ICD-10-CM

## 2020-07-24 MED ORDER — IOPAMIDOL (ISOVUE-370) INJECTION 76%
75.0000 mL | Freq: Once | INTRAVENOUS | Status: AC | PRN
  Administered 2020-07-24: 75 mL via INTRAVENOUS

## 2020-07-27 ENCOUNTER — Encounter: Payer: Self-pay | Admitting: Family Medicine

## 2020-07-27 DIAGNOSIS — I700 Atherosclerosis of aorta: Secondary | ICD-10-CM | POA: Insufficient documentation

## 2020-07-28 ENCOUNTER — Other Ambulatory Visit (HOSPITAL_COMMUNITY)
Admission: RE | Admit: 2020-07-28 | Discharge: 2020-07-28 | Disposition: A | Discharge status: Home / Self Care | Payer: Medicare HMO | Source: Ambulatory Visit | Attending: Cardiology | Admitting: Cardiology

## 2020-07-28 DIAGNOSIS — Z20822 Contact with and (suspected) exposure to covid-19: Secondary | ICD-10-CM | POA: Insufficient documentation

## 2020-07-28 DIAGNOSIS — Z01812 Encounter for preprocedural laboratory examination: Secondary | ICD-10-CM | POA: Insufficient documentation

## 2020-07-28 LAB — SARS CORONAVIRUS 2 (TAT 6-24 HRS): SARS Coronavirus 2: NEGATIVE

## 2020-07-28 NOTE — Addendum Note (Signed)
Addended by: Rudean Haskell A on: 07/28/2020 02:45 PM   Modules accepted: Orders, SmartSet

## 2020-07-31 ENCOUNTER — Ambulatory Visit (HOSPITAL_COMMUNITY): Payer: Medicare HMO | Admitting: Anesthesiology

## 2020-07-31 ENCOUNTER — Other Ambulatory Visit: Payer: Self-pay

## 2020-07-31 ENCOUNTER — Telehealth: Payer: Self-pay

## 2020-07-31 ENCOUNTER — Encounter (HOSPITAL_COMMUNITY): Payer: Self-pay | Admitting: Cardiology

## 2020-07-31 ENCOUNTER — Ambulatory Visit (HOSPITAL_BASED_OUTPATIENT_CLINIC_OR_DEPARTMENT_OTHER)
Admission: RE | Admit: 2020-07-31 | Discharge: 2020-07-31 | Disposition: A | Discharge status: Home / Self Care | Payer: Medicare HMO | Source: Home / Self Care | Attending: Cardiology | Admitting: Cardiology

## 2020-07-31 ENCOUNTER — Encounter (HOSPITAL_COMMUNITY)
Admission: RE | Disposition: A | Discharge status: Home / Self Care | Payer: Self-pay | Source: Home / Self Care | Attending: Cardiology

## 2020-07-31 DIAGNOSIS — I509 Heart failure, unspecified: Secondary | ICD-10-CM | POA: Insufficient documentation

## 2020-07-31 DIAGNOSIS — I4892 Unspecified atrial flutter: Secondary | ICD-10-CM | POA: Insufficient documentation

## 2020-07-31 DIAGNOSIS — G4733 Obstructive sleep apnea (adult) (pediatric): Secondary | ICD-10-CM | POA: Insufficient documentation

## 2020-07-31 DIAGNOSIS — I739 Peripheral vascular disease, unspecified: Secondary | ICD-10-CM | POA: Insufficient documentation

## 2020-07-31 DIAGNOSIS — N1832 Chronic kidney disease, stage 3b: Secondary | ICD-10-CM | POA: Insufficient documentation

## 2020-07-31 DIAGNOSIS — I5023 Acute on chronic systolic (congestive) heart failure: Secondary | ICD-10-CM | POA: Diagnosis not present

## 2020-07-31 DIAGNOSIS — I130 Hypertensive heart and chronic kidney disease with heart failure and stage 1 through stage 4 chronic kidney disease, or unspecified chronic kidney disease: Secondary | ICD-10-CM | POA: Insufficient documentation

## 2020-07-31 DIAGNOSIS — Z79899 Other long term (current) drug therapy: Secondary | ICD-10-CM | POA: Insufficient documentation

## 2020-07-31 DIAGNOSIS — I714 Abdominal aortic aneurysm, without rupture: Secondary | ICD-10-CM | POA: Insufficient documentation

## 2020-07-31 DIAGNOSIS — I7101 Dissection of thoracic aorta: Secondary | ICD-10-CM | POA: Diagnosis not present

## 2020-07-31 HISTORY — PX: CARDIOVERSION: SHX1299

## 2020-07-31 HISTORY — DX: Chronic kidney disease, unspecified: N18.9

## 2020-07-31 LAB — POCT I-STAT, CHEM 8
BUN: 67 mg/dL — ABNORMAL HIGH (ref 8–23)
Calcium, Ion: 1.02 mmol/L — ABNORMAL LOW (ref 1.15–1.40)
Chloride: 101 mmol/L (ref 98–111)
Creatinine, Ser: 2.5 mg/dL — ABNORMAL HIGH (ref 0.61–1.24)
Glucose, Bld: 105 mg/dL — ABNORMAL HIGH (ref 70–99)
HCT: 40 % (ref 39.0–52.0)
Hemoglobin: 13.6 g/dL (ref 13.0–17.0)
Potassium: 3.9 mmol/L (ref 3.5–5.1)
Sodium: 138 mmol/L (ref 135–145)
TCO2: 25 mmol/L (ref 22–32)

## 2020-07-31 SURGERY — CARDIOVERSION
Anesthesia: General

## 2020-07-31 MED ORDER — PHENYLEPHRINE HCL (PRESSORS) 10 MG/ML IV SOLN
INTRAVENOUS | Status: DC | PRN
  Administered 2020-07-31 (×3): 40 ug via INTRAVENOUS
  Administered 2020-07-31: 80 ug via INTRAVENOUS

## 2020-07-31 MED ORDER — LIDOCAINE HCL (CARDIAC) PF 100 MG/5ML IV SOSY
PREFILLED_SYRINGE | INTRAVENOUS | Status: DC | PRN
  Administered 2020-07-31: 100 mg via INTRATRACHEAL

## 2020-07-31 MED ORDER — SODIUM CHLORIDE 0.9 % IV SOLN: INTRAVENOUS | Status: DC

## 2020-07-31 MED ORDER — PROPOFOL 10 MG/ML IV BOLUS
INTRAVENOUS | Status: DC | PRN
  Administered 2020-07-31: 40 mg via INTRAVENOUS
  Administered 2020-07-31: 70 mg via INTRAVENOUS

## 2020-07-31 NOTE — H&P (Signed)
Expand AllCollapse All    Cardiology Office Note:    Date:  07/20/2020   ID:  Logan Robertson, DOB 20-Nov-1955, MRN [id number]  PCP:  Logan Hefty, DO              [place]  Cardiologist:  Logan Dresser, MD  Advanced Practice Provider:  No care team member to display Electrophysiologist:  None       Referring MD: Logan Hefty, DO   CC:  DOD- Tachycardia  History of Present Illness:    Logan Robertson is a 66 y.o. male with a hx of Atrial Flutter, HFrEF last at 35-40%, HTN, HLD, OSA unclear if actually on CPAP, PAD, prior CVA, CKD Stage IIIb, Aortic Aneurysm with root dilation NOS (with prior associated AI- severe by echo but found to be better by CT at Dr. Orvan Robertson 07/13/20; unable to pull up CT) who presented 07/20/20.  Was planned for echocardiogram and found to have heart rate in 140s.  Patient notes that he is feeling a bit short of breat.  Has had no chest pain, chest pressure, chest tightness, chest stinging.  Notes a bit of DOE with walking; including walking to the bed for the echo Denies SOB at rest.  No PND or orthopnea.  No bendopnea, weight gain, leg swelling,  No change in his abdominal swelling.  No syncope or near syncope . Can feel that he is in funny heart rhythm.  Notes that he had bad experience in the ED in prior evaluations and does not want to go back.  Notes that he has not missed any of his medications.  Brings his medication in his bags and notes only change is hydralazine 50 mg is now BID.  Takes metoprolol succinate 100 mg PO Daily.  Ambulatory BP not done..       Past Medical History:  Diagnosis Date  . Hypertension   . Lyme disease   . Myocardial infarction ([place])   . Sleep apnea          Past Surgical History:  Procedure Laterality Date  . RIGHT/LEFT HEART CATH AND CORONARY ANGIOGRAPHY N/A 09/30/2019   Procedure: RIGHT/LEFT HEART CATH AND CORONARY ANGIOGRAPHY;  Surgeon: Logan Booze,  MD;  Location: [facility] [place] [facility];  Service: Cardiovascular;  Laterality: N/A;    Current Medications: Active Medications      Current Meds  Medication Sig  . metoprolol (TOPROL XL) 200 MG 24 hr tablet Take 1 tablet (200 mg total) by mouth daily.       Allergies:   Lisinopril   Social History        Socioeconomic History  . Marital status: Single    Spouse name: Not on file  . Number of children: Not on file  . Years of education: Not on file  . Highest education level: Not on file  Occupational History  . Not on file  Tobacco Use  . Smoking status: Never Smoker  . Smokeless tobacco: Never Used  Substance and Sexual Activity  . Alcohol use: Not Currently  . Drug use: Not Currently  . Sexual activity: Not on file  Other Topics Concern  . Not on file  Social History Narrative  . Not on file   Social Determinants of Health   Financial Resource Strain: Not on file  Food Insecurity: Not on file  Transportation Needs: Not on file  Physical Activity: Not on file  Stress: Not on file  Social Connections: Not  on file     Family History: No Fx of HF or AF  ROS:   Please see the history of present illness.     All other systems reviewed and are negative.  EKGs/Labs/Other Studies Reviewed:    The following studies were reviewed today:   EKG:  EKG is  ordered today.  The ekg ordered today demonstrates  07/20/20: AF RVR with Ashman Beats and PVCs; rate 136; second similar with rates 144  Transthoracic Echocardiogram: Date:  Results:  Severe AA dilation by Echo, LA is not grossly dilated IMPRESSIONS  1. Left ventricular ejection fraction, by estimation, is 35 to 40%. The  left ventricle has moderately decreased function. The left ventricle  demonstrates global hypokinesis. There is mild concentric left ventricular  hypertrophy. Left ventricular  diastolic function could not be evaluated.  2. Right ventricular systolic function is normal. The  right ventricular  size is normal. Tricuspid regurgitation signal is inadequate for assessing  PA pressure.  3. The mitral valve is normal in structure. Moderate mitral annular  calcification of the posterior mitral valve annulus .No evidence of mitral  valve regurgitation. No evidence of mitral stenosis.  4. The aortic valve is tricuspid. Aortic valve regurgitation is moderate.  Mild aortic valve sclerosis is present, with no evidence of aortic valve  stenosis. Aortic regurgitation PHT measures 329 msec.  5. Aortic dilatation noted. There is mild dilatation at the level of the  sinuses of Valsalva and severe dilatation of the ascending aorta measuring  43 mm and 59mm respectively.  6. The inferior vena cava is normal in size with greater than 50%  respiratory variability, suggesting right atrial pressure of 3 mmHg.  7. Left atrial size was severely dilated.    Left/Right Heart Catheterizations: Date: 09/30/2019 Results:  LV end diastolic pressure is normal.  There is no aortic valve stenosis.  Hemodynamic findings consistent with mild pulmonary hypertension.  Ao sat 97%, PA sat 69%, PA pressure 35/20, mean PA 26 mm Hg, mean PCWP 16 mm Hg; CO 6.9 L/min; CI 2.82  No angiographically apparent coronary artery disease.  Due to severe right subclavian tortuosity, we switched to the femoral approach. Would not attempt right radial approach in the future.  Continue medical therapy. Patient with intermittent atrial flutter during the case. He would convert back to NSR spontaneously, and once with IV metoprolol.    Recent Labs: 09/29/2019: Magnesium 1.7 12/06/2019: B Natriuretic Peptide 495.5 05/16/2020: Hemoglobin 14.3; Platelets 218 05/24/2020: TSH 1.820 06/08/2020: ALT 17; BUN 15; Creatinine, Ser 1.41; Potassium 3.9; Sodium 140  Recent Lipid Panel Labs (Brief)          Component Value Date/Time   CHOL 196 09/29/2019 0437   TRIG 98 09/29/2019 0437   HDL 42 09/29/2019  0437   CHOLHDL 4.7 09/29/2019 0437   VLDL 20 09/29/2019 0437   LDLCALC 134 (H) 09/29/2019 0437       Risk Assessment/Calculations:    CHA2DS2-VASc Score = 6  This indicates a 9.7% annual risk of stroke. The patient's score is based upon: CHF History: Yes HTN History: Yes Diabetes History: No Stroke History: Yes Vascular Disease History: Yes Age Score: 1 Gender Score: 0     Physical Exam:    VS:   HR 140 BP 130s/70s RR 22     Wt Readings from Last 3 Encounters:  07/13/20 243 lb (110.2 kg)  06/30/20 242 lb (109.8 kg)  06/08/20 245 lb 3.2 oz (111.2 kg)    GEN: Obese  male in mild disress HEENT: Normal NECK: No JVD; No carotid bruits LYMPHATICS: No lymphadenopathy CARDIAC: irregular tachycardia, no murmurs, rubs, gallops RESPIRATORY:  Clear to auscultation without rales, wheezing or rhonchi  ABDOMEN: Soft, non-tender, distended with dullness to percussion MUSCULOSKELETAL:  No edema; No deformity  SKIN: Warm and dry NEUROLOGIC:  Alert and oriented x 3 PSYCHIATRIC:  Normal affect   ASSESSMENT:    1. Paroxysmal atrial flutter ([place])   2. Heart failure with reduced ejection fraction ([place])   3. Peripheral arterial disease ([place])   4. History of CVA (cerebrovascular accident)   5. Atrial flutter, unspecified type ([place])   6. Ascending aorta dilatation ([facility])   7. Stage 3b chronic kidney disease ([place])   8. OSA (obstructive sleep apnea)    PLAN:    In order of problems listed above:  Paroxysmal Atrial Flutter  HFrEF 35-40%- Stag B NYHA II HTN HLD OSA unclear if actually on CPAP PAD Hx CVA,  CKD Stage IIIb,  Aortic Aneurysm - discussed that ideals given sx AFl RVR and no missed doses of AC we would bring patient over to ED and do DCCV; Patient defers, citing issues he has had in the ED at times - discussed red flag symptoms for ED and EMS Eval - will schedule for time sensitive DCCV - short term will increase metoprolol succinate to 200 mg PO  Daily; patient to check BP and call us with result - continue DOAC - no change to Torsemide or SGLT2i, thought volume is a bit up - no change to diovan; low threshold to hold hydralazine if hypotension on increase BB - will need follow up Echo given that we have paused it for this appointment - reaching out to primary cardiologist  Shared Decision Making/Informed Consent The risks (stroke, cardiac arrhythmias rarely resulting in the need for a temporary or permanent pacemaker, skin irritation or burns and complications associated with conscious sedation including aspiration, arrhythmia, respiratory failure and death), benefits (restoration of normal sinus rhythm) and alternatives of a direct current cardioversion were explained in detail to Mr. Bartosiewicz and he agrees to proceed.          Medication Adjustments/Labs and Tests Ordered: Current medicines are reviewed at length with the patient today.  Concerns regarding medicines are outlined above.     Orders Placed This Encounter  Procedures  . EKG 12-Lead       Meds ordered this encounter  Medications  . metoprolol (TOPROL XL) 200 MG 24 hr tablet    Sig: Take 1 tablet (200 mg total) by mouth daily.    Dispense:  90 tablet    Refill:  1    There are no Patient Instructions on file for this visit.   Signed, Werner Lean, MD  07/20/2020 1:28 PM    [facility]   For DCCV; compliant with apixaban; no changes. Kirk Ruths

## 2020-07-31 NOTE — Discharge Instructions (Signed)
Electrical Cardioversion Electrical cardioversion is the delivery of a jolt of electricity to restore a normal rhythm to the heart. A rhythm that is too fast or is not regular keeps the heart from pumping well. In this procedure, sticky patches or metal paddles are placed on the chest to deliver electricity to the heart from a device. This procedure may be done in an emergency if:  There is low or no blood pressure as a result of the heart rhythm.  Normal rhythm must be restored as fast as possible to protect the brain and heart from further damage.  It may save a life. This may also be a scheduled procedure for irregular or fast heart rhythms that are not immediately life-threatening. Tell a health care provider about:  Any allergies you have.  All medicines you are taking, including vitamins, herbs, eye drops, creams, and over-the-counter medicines.  Any problems you or family members have had with anesthetic medicines.  Any blood disorders you have.  Any surgeries you have had.  Any medical conditions you have.  Whether you are pregnant or may be pregnant. What are the risks? Generally, this is a safe procedure. However, problems may occur, including:  Allergic reactions to medicines.  A blood clot that breaks free and travels to other parts of your body.  The possible return of an abnormal heart rhythm within hours or days after the procedure.  Your heart stopping (cardiac arrest). This is rare. What happens before the procedure? Medicines  Your health care provider may have you start taking: ? Blood-thinning medicines (anticoagulants) so your blood does not clot as easily. ? Medicines to help stabilize your heart rate and rhythm.  Ask your health care provider about: ? Changing or stopping your regular medicines. This is especially important if you are taking diabetes medicines or blood thinners. ? Taking medicines such as aspirin and ibuprofen. These medicines can  thin your blood. Do not take these medicines unless your health care provider tells you to take them. ? Taking over-the-counter medicines, vitamins, herbs, and supplements. General instructions  Follow instructions from your health care provider about eating or drinking restrictions.  Plan to have someone take you home from the hospital or clinic.  If you will be going home right after the procedure, plan to have someone with you for 24 hours.  Ask your health care provider what steps will be taken to help prevent infection. These may include washing your skin with a germ-killing soap. What happens during the procedure?  An IV will be inserted into one of your veins.  Sticky patches (electrodes) or metal paddles may be placed on your chest.  You will be given a medicine to help you relax (sedative).  An electrical shock will be delivered. The procedure may vary among health care providers and hospitals.   What can I expect after the procedure?  Your blood pressure, heart rate, breathing rate, and blood oxygen level will be monitored until you leave the hospital or clinic.  Your heart rhythm will be watched to make sure it does not change.  You may have some redness on the skin where the shocks were given. Follow these instructions at home:  Do not drive for 24 hours if you were given a sedative during your procedure.  Take over-the-counter and prescription medicines only as told by your health care provider.  Ask your health care provider how to check your pulse. Check it often.  Rest for 48 hours after the procedure   or as told by your health care provider.  Avoid or limit your caffeine use as told by your health care provider.  Keep all follow-up visits as told by your health care provider. This is important. Contact a health care provider if:  You feel like your heart is beating too quickly or your pulse is not regular.  You have a serious muscle cramp that does not go  away. Get help right away if:  You have discomfort in your chest.  You are dizzy or you feel faint.  You have trouble breathing or you are short of breath.  Your speech is slurred.  You have trouble moving an arm or leg on one side of your body.  Your fingers or toes turn cold or blue. Summary  Electrical cardioversion is the delivery of a jolt of electricity to restore a normal rhythm to the heart.  This procedure may be done right away in an emergency or may be a scheduled procedure if the condition is not an emergency.  Generally, this is a safe procedure.  After the procedure, check your pulse often as told by your health care provider. This information is not intended to replace advice given to you by your health care provider. Make sure you discuss any questions you have with your health care provider. Document Revised: 11/02/2018 Document Reviewed: 11/02/2018 Elsevier Patient Education  2021 [employer]  

## 2020-07-31 NOTE — Transfer of Care (Signed)
Immediate Anesthesia Transfer of Care Note  Patient: Logan Robertson  Procedure(s) Performed: CARDIOVERSION (N/A )  Patient Location: Endoscopy Unit  Anesthesia Type:General  Level of Consciousness: sedated  Airway & Oxygen Therapy: Patient connected to nasal cannula oxygen  Post-op Assessment: Post -op Vital signs reviewed and stable  Post vital signs: stable  Last Vitals:  Vitals Value Taken Time  BP    Temp    Pulse    Resp    SpO2      Last Pain:  Vitals:   07/31/20 0738  TempSrc: Temporal  PainSc: 0-No pain         Complications: No complications documented.

## 2020-07-31 NOTE — Telephone Encounter (Signed)
-----   Message from Werner Lean, MD sent at 07/31/2020  8:41 AM EDT ----- Regarding: EP referral HFrEF and AFl

## 2020-07-31 NOTE — Interval H&P Note (Signed)
History and Physical Interval Note:  07/31/2020 7:32 AM  Logan Robertson  has presented today for surgery, with the diagnosis of A-FIB.  The various methods of treatment have been discussed with the patient and family. After consideration of risks, benefits and other options for treatment, the patient has consented to  Procedure(s): CARDIOVERSION (N/A) as a surgical intervention.  The patient's history has been reviewed, patient examined, no change in status, stable for surgery.  I have reviewed the patient's chart and labs.  Questions were answered to the patient's satisfaction.     Kirk Ruths

## 2020-07-31 NOTE — Anesthesia Preprocedure Evaluation (Addendum)
Anesthesia Evaluation  Patient identified by MRN, date of birth, ID band Patient awake    Reviewed: Allergy & Precautions, NPO status , Patient's Chart, lab work & pertinent test results  Airway Mallampati: II  TM Distance: >3 FB Neck ROM: Full    Dental  (+) Dental Advisory Given   Pulmonary sleep apnea ,     + decreased breath sounds(-) wheezing      Cardiovascular hypertension, Pt. on home beta blockers and Pt. on medications + Past MI, + Peripheral Vascular Disease and + DOE  + dysrhythmias Atrial Fibrillation + Valvular Problems/Murmurs AI  Rhythm:Regular Rate:Tachycardia  Echo 09/2019 1. Left ventricular ejection fraction, by estimation, is 35 to 40%. The left ventricle has moderately decreased function. The left ventricle demonstrates global hypokinesis. There is mild concentric left ventricular  hypertrophy. Left ventricular diastolic function could not be evaluated.  2. Right ventricular systolic function is normal. The right ventricular size is normal. Tricuspid regurgitation signal is inadequate for assessing PA pressure.  3. The mitral valve is normal in structure. Moderate mitral annular calcification of the posterior mitral valve annulus. No evidence of mitral valve regurgitation. No evidence of mitral stenosis.  4. The aortic valve is tricuspid. Aortic valve regurgitation is moderate. Mild aortic valve sclerosis is present, with no evidence of aortic valve stenosis. Aortic regurgitation PHT measures 329 msec.  5. Aortic dilatation noted. There is mild dilatation at the level of the sinuses of Valsalva and severe dilatation of the ascending aorta measuring 43 mm and 3mm respectively.  6. The inferior vena cava is normal in size with greater than 50% respiratory variability, suggesting right atrial pressure of 3 mmHg.  7. Left atrial size was severely dilated.    Neuro/Psych CVA    GI/Hepatic negative GI ROS, Neg  liver ROS,   Endo/Other  negative endocrine ROS  Renal/GU Renal disease     Musculoskeletal negative musculoskeletal ROS (+)   Abdominal   Peds  Hematology negative hematology ROS (+) anemia ,   Anesthesia Other Findings   Reproductive/Obstetrics                           Anesthesia Physical Anesthesia Plan  ASA: IV  Anesthesia Plan: General   Post-op Pain Management:    Induction: Intravenous  PONV Risk Score and Plan: 2 and TIVA and Treatment may vary due to age or medical condition  Airway Management Planned: Mask  Additional Equipment: None  Intra-op Plan:   Post-operative Plan:   Informed Consent: I have reviewed the patients History and Physical, chart, labs and discussed the procedure including the risks, benefits and alternatives for the proposed anesthesia with the patient or authorized representative who has indicated his/her understanding and acceptance.     Dental advisory given  Plan Discussed with:   Anesthesia Plan Comments:        Anesthesia Quick Evaluation

## 2020-07-31 NOTE — Procedures (Addendum)
Electrical Cardioversion Procedure Note Logan Robertson [id number] 1955-10-01  Procedure: Electrical Cardioversion Indications:  Atrial Flutter  Procedure Details Consent: Risks of procedure as well as the alternatives and risks of each were explained to the (patient/caregiver).  Consent for procedure obtained. Time Out: Verified patient identification, verified procedure, site/side was marked, verified correct patient position, special equipment/implants available, medications/allergies/relevent history reviewed, required imaging and test results available.  Performed  Patient placed on cardiac monitor, pulse oximetry, supplemental oxygen as necessary.  Sedation given: Pt sedated by anesthesia with lidocaine 100 mg and diprovan 70 mg IV. Pacer pads placed anterior and posterior chest.  Cardioverted 2 time(s).  Cardioverted at 120J to NSR with PACs; developed recurrent atrial flutter; pt given 40 more mg diprovan IV and second cardioversion with 120J resulted in Sinus with PACS; pt then developed recurrent atrial flutter.  Evaluation Findings: Post procedure EKG shows: Atrial flutter Complications: None Patient did tolerate procedure well.  Continue present meds; likely needs fu with EP for ablation.   Kirk Ruths 07/31/2020, 7:30 AM

## 2020-07-31 NOTE — Telephone Encounter (Signed)
Called patient in regards to referral to EP.  Left a message for patient to call the office at his earliest convenience.

## 2020-08-01 ENCOUNTER — Encounter (HOSPITAL_COMMUNITY): Payer: Self-pay | Admitting: Cardiology

## 2020-08-01 NOTE — Anesthesia Postprocedure Evaluation (Signed)
Anesthesia Post Note  Patient: Logan Robertson  Procedure(s) Performed: CARDIOVERSION (N/A )     Patient location during evaluation: Endoscopy Anesthesia Type: General Level of consciousness: patient cooperative Pain management: pain level controlled Vital Signs Assessment: post-procedure vital signs reviewed and stable Respiratory status: spontaneous breathing Cardiovascular status: stable Anesthetic complications: no   No complications documented.  Last Vitals:  Vitals:   07/31/20 0852 07/31/20 0902  BP: 132/79 (!) 122/100  Pulse: (!) 132 88  Resp: (!) 29 (!) 23  Temp:    SpO2: 93% 91%    Last Pain:  Vitals:   07/31/20 0902  TempSrc:   PainSc: 0-No pain                 Nolon Nations

## 2020-08-01 NOTE — Telephone Encounter (Signed)
Patient is requesting to reschedule his DCCV and to schedule and appointment with an EP provider.

## 2020-08-01 NOTE — Telephone Encounter (Signed)
Attemped phone call to pt.  Left voicemail message to contact triage at [phone].

## 2020-08-03 ENCOUNTER — Other Ambulatory Visit: Payer: Self-pay

## 2020-08-03 ENCOUNTER — Encounter (HOSPITAL_COMMUNITY): Payer: Self-pay

## 2020-08-03 ENCOUNTER — Emergency Department (HOSPITAL_COMMUNITY): Payer: Medicare HMO

## 2020-08-03 ENCOUNTER — Inpatient Hospital Stay (HOSPITAL_COMMUNITY)
Admission: EM | Admit: 2020-08-03 | Discharge: 2020-08-13 | DRG: 299 | Disposition: E | Payer: Medicare HMO | Attending: Family Medicine | Admitting: Family Medicine

## 2020-08-03 ENCOUNTER — Inpatient Hospital Stay (HOSPITAL_COMMUNITY): Payer: Medicare HMO

## 2020-08-03 DIAGNOSIS — Z888 Allergy status to other drugs, medicaments and biological substances status: Secondary | ICD-10-CM

## 2020-08-03 DIAGNOSIS — Z20822 Contact with and (suspected) exposure to covid-19: Secondary | ICD-10-CM | POA: Diagnosis present

## 2020-08-03 DIAGNOSIS — J9601 Acute respiratory failure with hypoxia: Secondary | ICD-10-CM

## 2020-08-03 DIAGNOSIS — E872 Acidosis: Secondary | ICD-10-CM | POA: Diagnosis present

## 2020-08-03 DIAGNOSIS — I428 Other cardiomyopathies: Secondary | ICD-10-CM | POA: Diagnosis present

## 2020-08-03 DIAGNOSIS — I252 Old myocardial infarction: Secondary | ICD-10-CM | POA: Diagnosis not present

## 2020-08-03 DIAGNOSIS — Z8673 Personal history of transient ischemic attack (TIA), and cerebral infarction without residual deficits: Secondary | ICD-10-CM | POA: Diagnosis not present

## 2020-08-03 DIAGNOSIS — I5043 Acute on chronic combined systolic (congestive) and diastolic (congestive) heart failure: Secondary | ICD-10-CM | POA: Diagnosis not present

## 2020-08-03 DIAGNOSIS — I351 Nonrheumatic aortic (valve) insufficiency: Secondary | ICD-10-CM

## 2020-08-03 DIAGNOSIS — I7101 Dissection of thoracic aorta: Principal | ICD-10-CM | POA: Diagnosis present

## 2020-08-03 DIAGNOSIS — J9600 Acute respiratory failure, unspecified whether with hypoxia or hypercapnia: Secondary | ICD-10-CM | POA: Diagnosis not present

## 2020-08-03 DIAGNOSIS — I214 Non-ST elevation (NSTEMI) myocardial infarction: Secondary | ICD-10-CM

## 2020-08-03 DIAGNOSIS — R0609 Other forms of dyspnea: Secondary | ICD-10-CM | POA: Diagnosis not present

## 2020-08-03 DIAGNOSIS — I7102 Dissection of abdominal aorta: Secondary | ICD-10-CM | POA: Diagnosis not present

## 2020-08-03 DIAGNOSIS — R000 Tachycardia, unspecified: Secondary | ICD-10-CM | POA: Diagnosis present

## 2020-08-03 DIAGNOSIS — Z8546 Personal history of malignant neoplasm of prostate: Secondary | ICD-10-CM | POA: Diagnosis not present

## 2020-08-03 DIAGNOSIS — Z515 Encounter for palliative care: Secondary | ICD-10-CM

## 2020-08-03 DIAGNOSIS — E876 Hypokalemia: Secondary | ICD-10-CM | POA: Diagnosis present

## 2020-08-03 DIAGNOSIS — I7100 Dissection of unspecified site of aorta: Secondary | ICD-10-CM

## 2020-08-03 DIAGNOSIS — J189 Pneumonia, unspecified organism: Secondary | ICD-10-CM | POA: Diagnosis present

## 2020-08-03 DIAGNOSIS — Z978 Presence of other specified devices: Secondary | ICD-10-CM

## 2020-08-03 DIAGNOSIS — Z6832 Body mass index (BMI) 32.0-32.9, adult: Secondary | ICD-10-CM

## 2020-08-03 DIAGNOSIS — I7103 Dissection of thoracoabdominal aorta: Secondary | ICD-10-CM | POA: Diagnosis not present

## 2020-08-03 DIAGNOSIS — K7200 Acute and subacute hepatic failure without coma: Secondary | ICD-10-CM | POA: Diagnosis not present

## 2020-08-03 DIAGNOSIS — I25118 Atherosclerotic heart disease of native coronary artery with other forms of angina pectoris: Secondary | ICD-10-CM | POA: Diagnosis present

## 2020-08-03 DIAGNOSIS — I5082 Biventricular heart failure: Secondary | ICD-10-CM | POA: Diagnosis present

## 2020-08-03 DIAGNOSIS — N179 Acute kidney failure, unspecified: Secondary | ICD-10-CM | POA: Diagnosis present

## 2020-08-03 DIAGNOSIS — Z7901 Long term (current) use of anticoagulants: Secondary | ICD-10-CM

## 2020-08-03 DIAGNOSIS — I130 Hypertensive heart and chronic kidney disease with heart failure and stage 1 through stage 4 chronic kidney disease, or unspecified chronic kidney disease: Secondary | ICD-10-CM | POA: Diagnosis present

## 2020-08-03 DIAGNOSIS — E669 Obesity, unspecified: Secondary | ICD-10-CM | POA: Diagnosis present

## 2020-08-03 DIAGNOSIS — R579 Shock, unspecified: Secondary | ICD-10-CM | POA: Diagnosis not present

## 2020-08-03 DIAGNOSIS — I712 Thoracic aortic aneurysm, without rupture, unspecified: Secondary | ICD-10-CM

## 2020-08-03 DIAGNOSIS — I4892 Unspecified atrial flutter: Secondary | ICD-10-CM | POA: Diagnosis present

## 2020-08-03 DIAGNOSIS — G9349 Other encephalopathy: Secondary | ICD-10-CM | POA: Diagnosis present

## 2020-08-03 DIAGNOSIS — R001 Bradycardia, unspecified: Secondary | ICD-10-CM | POA: Diagnosis not present

## 2020-08-03 DIAGNOSIS — G4733 Obstructive sleep apnea (adult) (pediatric): Secondary | ICD-10-CM | POA: Diagnosis present

## 2020-08-03 DIAGNOSIS — R569 Unspecified convulsions: Secondary | ICD-10-CM | POA: Diagnosis not present

## 2020-08-03 DIAGNOSIS — G931 Anoxic brain damage, not elsewhere classified: Secondary | ICD-10-CM | POA: Diagnosis present

## 2020-08-03 DIAGNOSIS — T508X5A Adverse effect of diagnostic agents, initial encounter: Secondary | ICD-10-CM | POA: Diagnosis present

## 2020-08-03 DIAGNOSIS — I272 Pulmonary hypertension, unspecified: Secondary | ICD-10-CM | POA: Diagnosis present

## 2020-08-03 DIAGNOSIS — R570 Cardiogenic shock: Secondary | ICD-10-CM

## 2020-08-03 DIAGNOSIS — R509 Fever, unspecified: Secondary | ICD-10-CM | POA: Diagnosis not present

## 2020-08-03 DIAGNOSIS — I5023 Acute on chronic systolic (congestive) heart failure: Secondary | ICD-10-CM | POA: Diagnosis present

## 2020-08-03 DIAGNOSIS — N141 Nephropathy induced by other drugs, medicaments and biological substances: Secondary | ICD-10-CM | POA: Diagnosis present

## 2020-08-03 DIAGNOSIS — I700 Atherosclerosis of aorta: Secondary | ICD-10-CM | POA: Diagnosis present

## 2020-08-03 DIAGNOSIS — I2699 Other pulmonary embolism without acute cor pulmonale: Secondary | ICD-10-CM | POA: Diagnosis not present

## 2020-08-03 DIAGNOSIS — N289 Disorder of kidney and ureter, unspecified: Secondary | ICD-10-CM

## 2020-08-03 DIAGNOSIS — I509 Heart failure, unspecified: Secondary | ICD-10-CM | POA: Diagnosis not present

## 2020-08-03 DIAGNOSIS — N1831 Chronic kidney disease, stage 3a: Secondary | ICD-10-CM | POA: Diagnosis present

## 2020-08-03 DIAGNOSIS — Z66 Do not resuscitate: Secondary | ICD-10-CM | POA: Diagnosis not present

## 2020-08-03 DIAGNOSIS — Z79899 Other long term (current) drug therapy: Secondary | ICD-10-CM

## 2020-08-03 DIAGNOSIS — E785 Hyperlipidemia, unspecified: Secondary | ICD-10-CM

## 2020-08-03 DIAGNOSIS — R34 Anuria and oliguria: Secondary | ICD-10-CM | POA: Diagnosis present

## 2020-08-03 DIAGNOSIS — F419 Anxiety disorder, unspecified: Secondary | ICD-10-CM | POA: Diagnosis present

## 2020-08-03 DIAGNOSIS — R451 Restlessness and agitation: Secondary | ICD-10-CM | POA: Diagnosis present

## 2020-08-03 DIAGNOSIS — I255 Ischemic cardiomyopathy: Secondary | ICD-10-CM | POA: Diagnosis present

## 2020-08-03 DIAGNOSIS — T80219A Unspecified infection due to central venous catheter, initial encounter: Secondary | ICD-10-CM

## 2020-08-03 LAB — CBC WITH DIFFERENTIAL/PLATELET
Abs Immature Granulocytes: 0.08 10*3/uL — ABNORMAL HIGH (ref 0.00–0.07)
Basophils Absolute: 0 10*3/uL (ref 0.0–0.1)
Basophils Relative: 0 %
Eosinophils Absolute: 0 10*3/uL (ref 0.0–0.5)
Eosinophils Relative: 0 %
HCT: 37.9 % — ABNORMAL LOW (ref 39.0–52.0)
Hemoglobin: 13.6 g/dL (ref 13.0–17.0)
Immature Granulocytes: 1 %
Lymphocytes Relative: 10 %
Lymphs Abs: 1.2 10*3/uL (ref 0.7–4.0)
MCH: 32.9 pg (ref 26.0–34.0)
MCHC: 35.9 g/dL (ref 30.0–36.0)
MCV: 91.8 fL (ref 80.0–100.0)
Monocytes Absolute: 0.7 10*3/uL (ref 0.1–1.0)
Monocytes Relative: 7 %
Neutro Abs: 9 10*3/uL — ABNORMAL HIGH (ref 1.7–7.7)
Neutrophils Relative %: 82 %
Platelets: 238 10*3/uL (ref 150–400)
RBC: 4.13 MIL/uL — ABNORMAL LOW (ref 4.22–5.81)
RDW: 16 % — ABNORMAL HIGH (ref 11.5–15.5)
WBC: 11.1 10*3/uL — ABNORMAL HIGH (ref 4.0–10.5)
nRBC: 0 % (ref 0.0–0.2)

## 2020-08-03 LAB — POCT I-STAT 7, (LYTES, BLD GAS, ICA,H+H)
Acid-base deficit: 5 mmol/L — ABNORMAL HIGH (ref 0.0–2.0)
Bicarbonate: 16 mmol/L — ABNORMAL LOW (ref 20.0–28.0)
Calcium, Ion: 1.13 mmol/L — ABNORMAL LOW (ref 1.15–1.40)
HCT: 38 % — ABNORMAL LOW (ref 39.0–52.0)
Hemoglobin: 12.9 g/dL — ABNORMAL LOW (ref 13.0–17.0)
O2 Saturation: 99 %
Potassium: 4.7 mmol/L (ref 3.5–5.1)
Sodium: 132 mmol/L — ABNORMAL LOW (ref 135–145)
TCO2: 17 mmol/L — ABNORMAL LOW (ref 22–32)
pCO2 arterial: 21.2 mmHg — ABNORMAL LOW (ref 32.0–48.0)
pH, Arterial: 7.486 — ABNORMAL HIGH (ref 7.350–7.450)
pO2, Arterial: 143 mmHg — ABNORMAL HIGH (ref 83.0–108.0)

## 2020-08-03 LAB — CBC
HCT: 33.3 % — ABNORMAL LOW (ref 39.0–52.0)
HCT: 37.1 % — ABNORMAL LOW (ref 39.0–52.0)
HCT: 35.6 % — ABNORMAL LOW (ref 39.0–52.0)
Hemoglobin: 11.6 g/dL — ABNORMAL LOW (ref 13.0–17.0)
Hemoglobin: 13.4 g/dL (ref 13.0–17.0)
Hemoglobin: 13.1 g/dL (ref 13.0–17.0)
MCH: 32.8 pg (ref 26.0–34.0)
MCH: 32.8 pg (ref 26.0–34.0)
MCH: 33 pg (ref 26.0–34.0)
MCHC: 34.8 g/dL (ref 30.0–36.0)
MCHC: 36.1 g/dL — ABNORMAL HIGH (ref 30.0–36.0)
MCHC: 36.8 g/dL — ABNORMAL HIGH (ref 30.0–36.0)
MCV: 94.1 fL (ref 80.0–100.0)
MCV: 90.7 fL (ref 80.0–100.0)
MCV: 89.7 fL (ref 80.0–100.0)
Platelets: 211 10*3/uL (ref 150–400)
Platelets: 239 10*3/uL (ref 150–400)
Platelets: 233 10*3/uL (ref 150–400)
RBC: 3.54 MIL/uL — ABNORMAL LOW (ref 4.22–5.81)
RBC: 4.09 MIL/uL — ABNORMAL LOW (ref 4.22–5.81)
RBC: 3.97 MIL/uL — ABNORMAL LOW (ref 4.22–5.81)
RDW: 15.9 % — ABNORMAL HIGH (ref 11.5–15.5)
RDW: 15.7 % — ABNORMAL HIGH (ref 11.5–15.5)
RDW: 15.6 % — ABNORMAL HIGH (ref 11.5–15.5)
WBC: 8.8 10*3/uL (ref 4.0–10.5)
WBC: 9.4 10*3/uL (ref 4.0–10.5)
WBC: 11.5 10*3/uL — ABNORMAL HIGH (ref 4.0–10.5)
nRBC: 0.3 % — ABNORMAL HIGH (ref 0.0–0.2)
nRBC: 0.3 % — ABNORMAL HIGH (ref 0.0–0.2)
nRBC: 0.4 % — ABNORMAL HIGH (ref 0.0–0.2)

## 2020-08-03 LAB — ABO/RH: ABO/RH(D): A POS

## 2020-08-03 LAB — TROPONIN I (HIGH SENSITIVITY)
Troponin I (High Sensitivity): 454 ng/L (ref <18)
Troponin I (High Sensitivity): 420 ng/L (ref <18)
Troponin I (High Sensitivity): 427 ng/L (ref <18)
Troponin I (High Sensitivity): 483 ng/L (ref <18)

## 2020-08-03 LAB — COMPREHENSIVE METABOLIC PANEL
ALT: 33 U/L (ref 0–44)
ALT: 864 U/L — ABNORMAL HIGH (ref 0–44)
ALT: 1217 U/L — ABNORMAL HIGH (ref 0–44)
AST: 27 U/L (ref 15–41)
AST: 1078 U/L — ABNORMAL HIGH (ref 15–41)
AST: 2222 U/L — ABNORMAL HIGH (ref 15–41)
Albumin: 2.8 g/dL — ABNORMAL LOW (ref 3.5–5.0)
Albumin: 2.7 g/dL — ABNORMAL LOW (ref 3.5–5.0)
Albumin: 2.6 g/dL — ABNORMAL LOW (ref 3.5–5.0)
Alkaline Phosphatase: 40 U/L (ref 38–126)
Alkaline Phosphatase: 37 U/L — ABNORMAL LOW (ref 38–126)
Alkaline Phosphatase: 37 U/L — ABNORMAL LOW (ref 38–126)
Anion gap: 10 (ref 5–15)
Anion gap: 14 (ref 5–15)
Anion gap: 15 (ref 5–15)
BUN: 49 mg/dL — ABNORMAL HIGH (ref 8–23)
BUN: 53 mg/dL — ABNORMAL HIGH (ref 8–23)
BUN: 59 mg/dL — ABNORMAL HIGH (ref 8–23)
CO2: 26 mmol/L (ref 22–32)
CO2: 17 mmol/L — ABNORMAL LOW (ref 22–32)
CO2: 18 mmol/L — ABNORMAL LOW (ref 22–32)
Calcium: 8.8 mg/dL — ABNORMAL LOW (ref 8.9–10.3)
Calcium: 8.4 mg/dL — ABNORMAL LOW (ref 8.9–10.3)
Calcium: 8.5 mg/dL — ABNORMAL LOW (ref 8.9–10.3)
Chloride: 102 mmol/L (ref 98–111)
Chloride: 103 mmol/L (ref 98–111)
Chloride: 103 mmol/L (ref 98–111)
Creatinine, Ser: 2.23 mg/dL — ABNORMAL HIGH (ref 0.61–1.24)
Creatinine, Ser: 2.85 mg/dL — ABNORMAL HIGH (ref 0.61–1.24)
Creatinine, Ser: 3.05 mg/dL — ABNORMAL HIGH (ref 0.61–1.24)
GFR, Estimated: 32 mL/min — ABNORMAL LOW (ref >60)
GFR, Estimated: 23 mL/min — ABNORMAL LOW (ref >60)
GFR, Estimated: 22 mL/min — ABNORMAL LOW (ref >60)
Glucose, Bld: 134 mg/dL — ABNORMAL HIGH (ref 70–99)
Glucose, Bld: 150 mg/dL — ABNORMAL HIGH (ref 70–99)
Glucose, Bld: 104 mg/dL — ABNORMAL HIGH (ref 70–99)
Potassium: 3.1 mmol/L — ABNORMAL LOW (ref 3.5–5.1)
Potassium: 3.8 mmol/L (ref 3.5–5.1)
Potassium: 3.6 mmol/L (ref 3.5–5.1)
Sodium: 138 mmol/L (ref 135–145)
Sodium: 134 mmol/L — ABNORMAL LOW (ref 135–145)
Sodium: 136 mmol/L (ref 135–145)
Total Bilirubin: 1.9 mg/dL — ABNORMAL HIGH (ref 0.3–1.2)
Total Bilirubin: 2.4 mg/dL — ABNORMAL HIGH (ref 0.3–1.2)
Total Bilirubin: 1.8 mg/dL — ABNORMAL HIGH (ref 0.3–1.2)
Total Protein: 6.5 g/dL (ref 6.5–8.1)
Total Protein: 6.3 g/dL — ABNORMAL LOW (ref 6.5–8.1)
Total Protein: 6 g/dL — ABNORMAL LOW (ref 6.5–8.1)

## 2020-08-03 LAB — I-STAT ARTERIAL BLOOD GAS, ED
Acid-base deficit: 5 mmol/L — ABNORMAL HIGH (ref 0.0–2.0)
Bicarbonate: 18.7 mmol/L — ABNORMAL LOW (ref 20.0–28.0)
Calcium, Ion: 1.1 mmol/L — ABNORMAL LOW (ref 1.15–1.40)
HCT: 36 % — ABNORMAL LOW (ref 39.0–52.0)
Hemoglobin: 12.2 g/dL — ABNORMAL LOW (ref 13.0–17.0)
O2 Saturation: 100 %
Patient temperature: 98.9
Potassium: 3 mmol/L — ABNORMAL LOW (ref 3.5–5.1)
Sodium: 137 mmol/L (ref 135–145)
TCO2: 20 mmol/L — ABNORMAL LOW (ref 22–32)
pCO2 arterial: 30.8 mmHg — ABNORMAL LOW (ref 32.0–48.0)
pH, Arterial: 7.39 (ref 7.350–7.450)
pO2, Arterial: 537 mmHg — ABNORMAL HIGH (ref 83.0–108.0)

## 2020-08-03 LAB — BRAIN NATRIURETIC PEPTIDE
B Natriuretic Peptide: 2066 pg/mL — ABNORMAL HIGH (ref 0.0–100.0)
B Natriuretic Peptide: 2329.1 pg/mL — ABNORMAL HIGH (ref 0.0–100.0)

## 2020-08-03 LAB — ECHOCARDIOGRAM COMPLETE
AR max vel: 3.39 cm2
AV Area VTI: 3.2 cm2
AV Area mean vel: 3.2 cm2
AV Mean grad: 1.5 mmHg
AV Peak grad: 2.3 mmHg
Ao pk vel: 0.75 m/s
Area-P 1/2: 4.06 cm2
Height: 72 in
MV M vel: 3.69 m/s
MV Peak grad: 54.5 mmHg
P 1/2 time: 2242 msec
Radius: 0.3 cm
S' Lateral: 5 cm
Single Plane A4C EF: 0.6 %
Weight: 3915.37 oz

## 2020-08-03 LAB — MAGNESIUM: Magnesium: 2.3 mg/dL (ref 1.7–2.4)

## 2020-08-03 LAB — LACTIC ACID, PLASMA
Lactic Acid, Venous: 8.6 mmol/L (ref 0.5–1.9)
Lactic Acid, Venous: 3.1 mmol/L (ref 0.5–1.9)

## 2020-08-03 LAB — LIPID PANEL
Cholesterol: 129 mg/dL (ref 0–200)
HDL: 38 mg/dL — ABNORMAL LOW (ref >40)
LDL Cholesterol: 79 mg/dL (ref 0–99)
Total CHOL/HDL Ratio: 3.4 RATIO
Triglycerides: 60 mg/dL (ref <150)
VLDL: 12 mg/dL (ref 0–40)

## 2020-08-03 LAB — PROTIME-INR
INR: 2.1 — ABNORMAL HIGH (ref 0.8–1.2)
Prothrombin Time: 23.1 seconds — ABNORMAL HIGH (ref 11.4–15.2)

## 2020-08-03 LAB — TYPE AND SCREEN
ABO/RH(D): A POS
Antibody Screen: NEGATIVE

## 2020-08-03 LAB — COOXEMETRY PANEL
Carboxyhemoglobin: 0.5 % (ref 0.5–1.5)
Methemoglobin: 0.8 % (ref 0.0–1.5)
O2 Saturation: 32.1 %
Total hemoglobin: 14.2 g/dL (ref 12.0–16.0)

## 2020-08-03 LAB — SARS CORONAVIRUS 2 (TAT 6-24 HRS): SARS Coronavirus 2: NEGATIVE

## 2020-08-03 LAB — LACTATE DEHYDROGENASE: LDH: 3274 U/L — ABNORMAL HIGH (ref 98–192)

## 2020-08-03 LAB — MRSA PCR SCREENING: MRSA by PCR: NEGATIVE

## 2020-08-03 MED ORDER — FENTANYL BOLUS VIA INFUSION
25.0000 ug | INTRAVENOUS | Status: DC | PRN
Start: 2020-08-03 — End: 2020-08-05
  Filled 2020-08-03: qty 100

## 2020-08-03 MED ORDER — ATORVASTATIN CALCIUM 80 MG PO TABS: 80.0000 mg | ORAL_TABLET | Freq: Every day | ORAL | Status: DC

## 2020-08-03 MED ORDER — CHLORHEXIDINE GLUCONATE 0.12% ORAL RINSE (MEDLINE KIT)
15.0000 mL | Freq: Two times a day (BID) | OROMUCOSAL | Status: DC
  Administered 2020-08-04 – 2020-08-05 (×3): 15 mL via OROMUCOSAL

## 2020-08-03 MED ORDER — DOCUSATE SODIUM 50 MG/5ML PO LIQD
100.0000 mg | Freq: Two times a day (BID) | ORAL | Status: DC
  Administered 2020-08-03 – 2020-08-05 (×4): 100 mg
  Filled 2020-08-03 (×6): qty 10

## 2020-08-03 MED ORDER — ACETAMINOPHEN 325 MG PO TABS: 650.0000 mg | ORAL_TABLET | ORAL | Status: DC | PRN

## 2020-08-03 MED ORDER — NITROGLYCERIN 0.4 MG SL SUBL: 0.4000 mg | SUBLINGUAL_TABLET | SUBLINGUAL | Status: DC | PRN

## 2020-08-03 MED ORDER — ACETAMINOPHEN 160 MG/5ML PO SOLN
650.0000 mg | ORAL | Status: DC | PRN
Start: 2020-08-03 — End: 2020-08-05
  Administered 2020-08-03 – 2020-08-05 (×2): 650 mg
  Filled 2020-08-03 (×2): qty 20.3

## 2020-08-03 MED ORDER — LORAZEPAM 2 MG/ML IJ SOLN
0.5000 mg | Freq: Once | INTRAMUSCULAR | Status: AC
  Administered 2020-08-03: 0.5 mg via INTRAVENOUS
  Filled 2020-08-03: qty 1

## 2020-08-03 MED ORDER — AMIODARONE HCL IN DEXTROSE 360-4.14 MG/200ML-% IV SOLN: 30.0000 mg/h | INTRAVENOUS | Status: DC

## 2020-08-03 MED ORDER — ALBUTEROL SULFATE (2.5 MG/3ML) 0.083% IN NEBU: 2.5000 mg | INHALATION_SOLUTION | RESPIRATORY_TRACT | Status: DC | PRN

## 2020-08-03 MED ORDER — ASPIRIN 81 MG PO CHEW
324.0000 mg | CHEWABLE_TABLET | Freq: Once | ORAL | Status: AC
  Administered 2020-08-03: 324 mg via ORAL
  Filled 2020-08-03: qty 4

## 2020-08-03 MED ORDER — POLYETHYLENE GLYCOL 3350 17 G PO PACK
17.0000 g | PACK | Freq: Every day | ORAL | Status: DC
  Administered 2020-08-04 – 2020-08-05 (×2): 17 g
  Filled 2020-08-03: qty 1

## 2020-08-03 MED ORDER — NOREPINEPHRINE 4 MG/250ML-% IV SOLN
0.0000 ug/min | INTRAVENOUS | Status: DC
Start: 2020-08-03 — End: 2020-08-03
  Administered 2020-08-03: 40 ug/min via INTRAVENOUS
  Administered 2020-08-03: 32 ug/min via INTRAVENOUS
  Filled 2020-08-03 (×2): qty 250

## 2020-08-03 MED ORDER — NOREPINEPHRINE 16 MG/250ML-% IV SOLN
0.0000 ug/min | INTRAVENOUS | Status: DC
  Administered 2020-08-03: 15 ug/min via INTRAVENOUS
  Filled 2020-08-03: qty 250

## 2020-08-03 MED ORDER — PERFLUTREN LIPID MICROSPHERE
1.0000 mL | INTRAVENOUS | Status: AC | PRN
  Administered 2020-08-03: 1 mL via INTRAVENOUS
  Filled 2020-08-03: qty 10

## 2020-08-03 MED ORDER — APIXABAN 5 MG PO TABS: 5.0000 mg | ORAL_TABLET | Freq: Two times a day (BID) | ORAL | Status: DC

## 2020-08-03 MED ORDER — ATORVASTATIN CALCIUM 80 MG PO TABS
80.0000 mg | ORAL_TABLET | Freq: Every day | ORAL | Status: DC
  Administered 2020-08-04 – 2020-08-05 (×2): 80 mg
  Filled 2020-08-03 (×2): qty 1

## 2020-08-03 MED ORDER — DEXMEDETOMIDINE HCL IN NACL 400 MCG/100ML IV SOLN
0.0000 ug/kg/h | INTRAVENOUS | Status: AC
  Administered 2020-08-03: 0.5 ug/kg/h via INTRAVENOUS
  Administered 2020-08-03: 0.501 ug/kg/h via INTRAVENOUS
  Administered 2020-08-04 – 2020-08-05 (×6): 1.2 ug/kg/h via INTRAVENOUS
  Filled 2020-08-03: qty 100
  Filled 2020-08-03: qty 200
  Filled 2020-08-03 (×8): qty 100

## 2020-08-03 MED ORDER — CHLORHEXIDINE GLUCONATE CLOTH 2 % EX PADS
6.0000 | MEDICATED_PAD | Freq: Every day | CUTANEOUS | Status: DC
  Administered 2020-08-03 – 2020-08-04 (×2): 6 via TOPICAL

## 2020-08-03 MED ORDER — CHLORHEXIDINE GLUCONATE 0.12% ORAL RINSE (MEDLINE KIT): 15.0000 mL | Freq: Two times a day (BID) | OROMUCOSAL | Status: DC

## 2020-08-03 MED ORDER — ORAL CARE MOUTH RINSE: 15.0000 mL | OROMUCOSAL | Status: DC

## 2020-08-03 MED ORDER — FENTANYL CITRATE (PF) 100 MCG/2ML IJ SOLN: 25.0000 ug | Freq: Once | INTRAMUSCULAR | Status: AC

## 2020-08-03 MED ORDER — FENTANYL CITRATE (PF) 100 MCG/2ML IJ SOLN
INTRAMUSCULAR | Status: AC
  Administered 2020-08-03: 100 ug via INTRAVENOUS
  Filled 2020-08-03: qty 2

## 2020-08-03 MED ORDER — CHLORHEXIDINE GLUCONATE 0.12% ORAL RINSE (MEDLINE KIT)
15.0000 mL | Freq: Two times a day (BID) | OROMUCOSAL | Status: DC
  Administered 2020-08-03 (×2): 15 mL via OROMUCOSAL

## 2020-08-03 MED ORDER — SODIUM CHLORIDE 0.9 % IV SOLN: 250.0000 mL | INTRAVENOUS | Status: DC

## 2020-08-03 MED ORDER — NOREPINEPHRINE 4 MG/250ML-% IV SOLN
INTRAVENOUS | Status: AC
  Administered 2020-08-03: 10 ug/min via INTRAVENOUS
  Filled 2020-08-03: qty 250

## 2020-08-03 MED ORDER — ONDANSETRON HCL 4 MG/2ML IJ SOLN: 4.0000 mg | Freq: Four times a day (QID) | INTRAMUSCULAR | Status: DC | PRN

## 2020-08-03 MED ORDER — ORAL CARE MOUTH RINSE
15.0000 mL | OROMUCOSAL | Status: DC
  Administered 2020-08-03 – 2020-08-06 (×21): 15 mL via OROMUCOSAL

## 2020-08-03 MED ORDER — SUCCINYLCHOLINE CHLORIDE 200 MG/10ML IV SOSY
PREFILLED_SYRINGE | INTRAVENOUS | Status: AC
  Administered 2020-08-03: 100 mg
  Filled 2020-08-03: qty 10

## 2020-08-03 MED ORDER — FENTANYL 2500MCG IN NS 250ML (10MCG/ML) PREMIX INFUSION
0.0000 ug/h | INTRAVENOUS | Status: DC
Start: 2020-08-03 — End: 2020-08-08
  Administered 2020-08-03 (×2): 50 ug/h via INTRAVENOUS
  Administered 2020-08-04 – 2020-08-05 (×2): 200 ug/h via INTRAVENOUS
  Administered 2020-08-05: 400 ug/h via INTRAVENOUS
  Administered 2020-08-06: 40 ug/h via INTRAVENOUS
  Administered 2020-08-06: 350 ug/h via INTRAVENOUS
  Administered 2020-08-06: 250 ug/h via INTRAVENOUS
  Administered 2020-08-07: 35 ug/h via INTRAVENOUS
  Administered 2020-08-07 (×2): 350 ug/h via INTRAVENOUS
  Filled 2020-08-03 (×11): qty 250

## 2020-08-03 MED ORDER — NOREPINEPHRINE 4 MG/250ML-% IV SOLN: 2.0000 ug/min | INTRAVENOUS | Status: DC

## 2020-08-03 MED ORDER — IOHEXOL 350 MG/ML SOLN
100.0000 mL | Freq: Once | INTRAVENOUS | Status: AC | PRN
  Administered 2020-08-03: 100 mL via INTRAVENOUS

## 2020-08-03 MED ORDER — DOBUTAMINE IN D5W 4-5 MG/ML-% IV SOLN
5.0000 ug/kg/min | INTRAVENOUS | Status: DC
  Administered 2020-08-03 – 2020-08-04 (×2): 5 ug/kg/min via INTRAVENOUS
  Filled 2020-08-03 (×2): qty 250

## 2020-08-03 MED ORDER — PANTOPRAZOLE SODIUM 40 MG IV SOLR
40.0000 mg | Freq: Every day | INTRAVENOUS | Status: DC
  Administered 2020-08-04 – 2020-08-05 (×2): 40 mg via INTRAVENOUS
  Filled 2020-08-03 (×2): qty 40

## 2020-08-03 MED ORDER — ORAL CARE MOUTH RINSE
15.0000 mL | OROMUCOSAL | Status: DC
  Administered 2020-08-03: 15 mL via OROMUCOSAL

## 2020-08-03 MED ORDER — FUROSEMIDE 10 MG/ML IJ SOLN
40.0000 mg | Freq: Once | INTRAMUSCULAR | Status: AC
  Administered 2020-08-03: 40 mg via INTRAVENOUS
  Filled 2020-08-03: qty 4

## 2020-08-03 MED ORDER — POTASSIUM CHLORIDE CRYS ER 20 MEQ PO TBCR
40.0000 meq | EXTENDED_RELEASE_TABLET | Freq: Once | ORAL | Status: AC
  Administered 2020-08-03: 40 meq via ORAL
  Filled 2020-08-03: qty 2

## 2020-08-03 MED ORDER — NOREPINEPHRINE 16 MG/250ML-% IV SOLN
0.0000 ug/min | INTRAVENOUS | Status: DC
  Administered 2020-08-03: 15 ug/min via INTRAVENOUS
  Administered 2020-08-05: 2 ug/min via INTRAVENOUS
  Filled 2020-08-03: qty 250

## 2020-08-03 MED ORDER — VASOPRESSIN 20 UNITS/100 ML INFUSION FOR SHOCK
0.0000 [IU]/min | INTRAVENOUS | Status: DC
Start: 2020-08-03 — End: 2020-08-03
  Administered 2020-08-03: 0.03 [IU]/min via INTRAVENOUS
  Filled 2020-08-03: qty 100

## 2020-08-03 MED ORDER — ETOMIDATE 2 MG/ML IV SOLN
INTRAVENOUS | Status: AC
  Administered 2020-08-03: 20 mg
  Filled 2020-08-03: qty 20

## 2020-08-03 MED ORDER — AMIODARONE HCL IN DEXTROSE 360-4.14 MG/200ML-% IV SOLN
60.0000 mg/h | INTRAVENOUS | Status: AC
  Administered 2020-08-03: 60 mg/h via INTRAVENOUS
  Filled 2020-08-03: qty 200

## 2020-08-03 NOTE — H&P (Signed)
Cardiology Admission History and Physical:   Patient ID: Logan Robertson; MRN: [id number]; DOB: 04/03/1956   Admission date: 07/28/2020  Primary Care Provider: Danna Hefty, DO Primary Cardiologist: Buford Dresser, MD    Chief Complaint:  Shortness of breath  History of Present Illness:   Logan Robertson is a 65 y.o. male with a history of paroxysmal atrial flutter (on Eliquis), HFrEF (EF 35-40%), HTN, HLD, sleep apnea, prior CVA, CKD Stage III, and ascending aortic aneurysm (with moderate AR), who presents to the hospital with complaints of worsening shortness of breath for 2 days. He underwent cardioversion for Atrial Flutter on 07/31/2020 (had initially presented for an echo but HR was in the 140s). Apparently the cardioversion was not successful. He has been feeling a fluttering in his chest since. Also reports weakness, fatigue and lightheadedness. He had some intermittent chest pain that occurred at rest. No radiation of the pain.  He denied any orthopnea, PND, leg swelling or recent weight gain.   In the ED the vitals were: BP 132/101 mmHg, HR 123 bpm Labs were: Na 138, K 3.1, BUN 49, Cr 2.23, WBC 11.1, Hgb 13.6, Hct 37.9, Plt 238, hs-trop 454 and BNP 2066  Recent testing Echocardiogram - 09/29/2019 1. Left ventricular ejection fraction, by estimation, is 35 to 40%. The left ventricle has moderately decreased function. The left ventricle demonstrates global hypokinesis. There is mild concentric left ventricular hypertrophy. Left ventricular diastolic function could not be evaluated. 2. Right ventricular systolic function is normal. The right ventricular size is normal. Tricuspid regurgitation signal is inadequate for assessing PA pressure. 3. The mitral valve is normal in structure. Moderate mitral annular calcification of the posterior mitral valve annulus .No evidence of mitral valve regurgitation. No evidence of mitral stenosis. 4. The aortic valve is tricuspid.  Aortic valve regurgitation is moderate. Mild aortic valve sclerosis is present, with no evidence of aortic valve stenosis. Aortic regurgitation PHT measures 329 msec. 5. Aortic dilatation noted. There is mild dilatation at the level of the sinuses of Valsalva and severe dilatation of the ascending aorta measuring 43 mm and 3mm respectively. 6. The inferior vena cava is normal in size with greater than 50% respiratory variability, suggesting right atrial pressure of 3 mmHg. 7. Left atrial size was severely dilated.  LHC / RHC - 08/09/621:  LV end diastolic pressure is normal.  There is no aortic valve stenosis.  Hemodynamic findings consistent with mild pulmonary hypertension.  Ao sat 97%, PA sat 69%, PA pressure 35/20, mean PA 26 mm Hg, mean PCWP 16 mm Hg; CO 6.9 L/min; CI 2.82  No angiographically apparent coronary artery disease.  Due to severe right subclavian tortuosity, we switched to the femoral approach. Would not attempt right radial approach in the future.   Past Medical History:  Diagnosis Date  . Chronic kidney disease   . Hypertension   . Lyme disease   . Myocardial infarction ([place])   . Sleep apnea     Past Surgical History:  Procedure Laterality Date  . ABLATION    . CARDIOVERSION N/A 07/31/2020   Procedure: CARDIOVERSION;  Surgeon: Lelon Perla, MD;  Location: [facility] ENDOSCOPY;  Service: Cardiovascular;  Laterality: N/A;  . RIGHT/LEFT HEART CATH AND CORONARY ANGIOGRAPHY N/A 09/30/2019   Procedure: RIGHT/LEFT HEART CATH AND CORONARY ANGIOGRAPHY;  Surgeon: Jettie Booze, MD;  Location: [place] [facility];  Service: Cardiovascular;  Laterality: N/A;     Medications Prior to Admission: Prior to Admission medications   Medication  Sig Start Date End Date Taking? Authorizing Provider  acetaminophen (TYLENOL) 325 MG tablet Take 650 mg by mouth every 6 (six) hours as needed for moderate pain.     [provider]  apixaban (ELIQUIS) 5 MG TABS tablet  Take 1 tablet (5 mg total) by mouth 2 (two) times daily. 05/16/20 06/15/20  Mullis, Kiersten P, DO  atorvastatin (LIPITOR) 80 MG tablet Take 1 tablet (80 mg total) by mouth daily. Patient not taking: Reported on 07/25/2020 05/16/20   Mina Marble P, DO  empagliflozin (JARDIANCE) 10 MG TABS tablet Take 10 mg by mouth daily.    [provider]  hydrALAZINE (APRESOLINE) 50 MG tablet Take 50 mg by mouth 3 (three) times daily.    [provider]  metoprolol (TOPROL XL) 200 MG 24 hr tablet Take 1 tablet (200 mg total) by mouth daily. 07/20/20   Werner Lean, MD  spironolactone (ALDACTONE) 25 MG tablet Take 1 tablet (25 mg total) by mouth at bedtime. 11/04/19   Leavy Cella, RPH-CPP  torsemide (DEMADEX) 20 MG tablet Take 1 tablet (20 mg total) by mouth as needed (For swelling or weight gain of 3 lbs over night or 5lbs in a week). Patient taking differently: Take 20 mg by mouth daily. 05/24/20   Buford Dresser, MD  traZODone (DESYREL) 50 MG tablet Take 50 mg by mouth at bedtime as needed for sleep.    [provider]  valsartan (DIOVAN) 320 MG tablet Take 320 mg by mouth daily. 05/14/19   [provider]     Allergies:    Allergies  Allergen Reactions  . Lisinopril Swelling    Angioedema - denies airway complications Tolerates ARBs    Social History:   Social History   Socioeconomic History  . Marital status: Single    Spouse name: Not on file  . Number of children: Not on file  . Years of education: Not on file  . Highest education level: Not on file  Occupational History  . Not on file  Tobacco Use  . Smoking status: Never Smoker  . Smokeless tobacco: Never Used  Substance and Sexual Activity  . Alcohol use: Not Currently  . Drug use: Not Currently  . Sexual activity: Not on file  Other Topics Concern  . Not on file  Social History Narrative  . Not on file   Social Determinants of Health   Financial Resource Strain: Not on file   Food Insecurity: Not on file  Transportation Needs: Not on file  Physical Activity: Not on file  Stress: Not on file  Social Connections: Not on file  Intimate Partner Violence: Not on file     Family History:  The patient's family history is not on file.     Review of Systems: [y] = yes, [ ]  = no   . General: Weight gain [ ] ; Weight loss [ ] ; Anorexia [ ] ; Fatigue [ ] ; Fever [ ] ; Chills [ ] ; Weakness [ ]   . Cardiac: Chest pain/pressure [ ] ; Resting SOB [Y]; Exertional SOB [Y]; Orthopnea [ ] ; Pedal Edema [ ] ; Palpitations [Y]; Syncope [ ] ; Presyncope [ ] ; Paroxysmal nocturnal dyspnea[ ]   . Pulmonary: Cough [ ] ; Wheezing[ ] ; Hemoptysis[ ] ; Sputum [ ] ; Snoring [ ]   . GI: Vomiting[ ] ; Dysphagia[ ] ; Melena[ ] ; Hematochezia [ ] ; Heartburn[ ] ; Abdominal pain [ ] ; Constipation [ ] ; Diarrhea [ ] ; BRBPR [ ]   . GU: Hematuria[ ] ; Dysuria [ ] ; Nocturia[ ]   .  Vascular: Pain in legs with walking [ ] ; Pain in feet with lying flat [ ] ; Non-healing sores [ ] ; Stroke [ ] ; TIA [ ] ; Slurred speech [ ] ;  . Neuro: Headaches[ ] ; Vertigo[ ] ; Seizures[ ] ; Paresthesias[ ] ;Blurred vision [ ] ; Diplopia [ ] ; Vision changes [ ]   . Ortho/Skin: Arthritis [ ] ; Joint pain [ ] ; Muscle pain [ ] ; Joint swelling [ ] ; Back Pain [ ] ; Rash [ ]   . Psych: Depression[ ] ; Anxiety[ ]   . Heme: Bleeding problems [ ] ; Clotting disorders [ ] ; Anemia [ ]   . Endocrine: Diabetes [ ] ; Thyroid dysfunction[ ]      Physical Exam/Data:   Vitals:   08/07/2020 0051 07/16/2020 0055 07/26/2020 0103  BP:   (!) 132/101  Pulse:   (!) 53  Resp:   16  Temp:   98.9 F (37.2 C)  TempSrc:   Oral  SpO2:  97% 98%  Weight: 111 kg    Height: 6' (1.829 m)     No intake or output data in the 24 hours ending 08/05/2020 0231 Filed Weights   08/12/2020 0051  Weight: 111 kg   Body mass index is 33.19 kg/m.  General:  Dyspneic HEENT: normal Lymph: no adenopathy Neck: Elevated JVP Endocrine:  No thryomegaly Vascular: No carotid bruits; FA pulses 2+  bilaterally without bruits  Cardiac:  Tachycardiac Lungs:  clear to auscultation bilaterally, no wheezing, rhonchi or rales  Abd: soft, nontender, no hepatomegaly  Ext: no LE edema Musculoskeletal:  No deformities, BUE and BLE strength normal and equal Skin: warm and dry  Neuro:  CNs 2-12 intact, no focal abnormalities noted Psych:  Normal affect    EKG:  The ECG that was done 08/02/2020 (at 00:44) was personally reviewed and demonstrates atrial flutter with rapid rate (123 bpm). LVH with repolarization abnormalities.    Laboratory Data:  Chemistry Recent Labs  Lab 07/31/20 0810 07/29/2020 0100  NA 138 138  K 3.9 3.1*  CL 101 102  CO2  --  26  GLUCOSE 105* 134*  BUN 67* 49*  CREATININE 2.50* 2.23*  CALCIUM  --  8.8*  GFRNONAA  --  32*  ANIONGAP  --  10    Recent Labs  Lab 07/26/2020 0100  PROT 6.5  ALBUMIN 2.8*  AST 27  ALT 33  ALKPHOS 40  BILITOT 1.9*   Hematology Recent Labs  Lab 07/31/20 0810 08/11/2020 0100  WBC  --  11.1*  RBC  --  4.13*  HGB 13.6 13.6  HCT 40.0 37.9*  MCV  --  91.8  MCH  --  32.9  MCHC  --  35.9  RDW  --  16.0*  PLT  --  238   Cardiac EnzymesNo results for input(s): TROPONINI in the last 168 hours. No results for input(s): TROPIPOC in the last 168 hours.  BNP Recent Labs  Lab 07/19/2020 0100  BNP 2,066.0*    DDimer No results for input(s): DDIMER in the last 168 hours.  Radiology/Studies:  DG Chest 2 View  Result Date: 07/16/2020 CLINICAL DATA:  Chest pain and shortness of breath EXAM: CHEST - 2 VIEW COMPARISON:  12/05/2019 FINDINGS: Cardiomegaly. Normal pleural spaces. No focal airspace consolidation or pulmonary edema. IMPRESSION: Cardiomegaly without focal airspace disease. Electronically Signed   By: Ulyses Jarred M.D.   On: 07/31/2020 01:19    Assessment and Plan:   1. Atrial flutter with rapid ventricular rate CHA2DS2-VAScScore = 6  -Admit for rate control -Continue Eliquis for anticoagulation -  Avoid diltiazem given  low EF -Will consider amiodarone if there is difficulty controlling HR with beta-blockers alone   2. Elevated troponin The patient presentation does not suggest an ACS. Elevated troponin (454) could be from increased LV filling pressures / demand ischemia. His last cardiac cath in June 2021 had shown non-obstructive coronary artery disease. ECG with LVH and repolarization abnormalities  -Serial ECGs -Continue to trend cardiac enzymes -Continue atorvastatin -No ASA since patient on apixaban  3. Cardiomyopathy BNP was elevated at 2066. CXR with cardiomegaly but no pulmonary edema  -Will hold metoprolol and hydralazine for soft BPs in the ED -Will switch from torsemide to IV furosemide (already received 40 mg IV in the ED) -Strict I and Os -Daily weights -Maintain serum K >4.0 and Mg >2.0   4. Renal dysfunction Could be from heart failure versus recent contrast exposure (from CT Chest angio)  -Will hold Diovan -Hold Jardiance -Hold spironolactone  5. Ascending aortic aneurysm with root dilatation (and AR) Patient is already following up with CT Surgery and getting periodic surveillance imaging. Last CT angio on 07/24/2020 showed annulus 20mm, sino-tubular junction 1mm and max diameter of ascending aorta of 5.4 cm.    Severity of Illness: The appropriate patient status for this patient is INPATIENT. Inpatient status is judged to be reasonable and necessary in order to provide the required intensity of service to ensure the patient's safety. The patient's presenting symptoms, physical exam findings, and initial radiographic and laboratory data in the context of their chronic comorbidities is felt to place them at high risk for further clinical deterioration. Furthermore, it is not anticipated that the patient will be medically stable for discharge from the hospital within 2 midnights of admission. The following factors support the patient status of inpatient.   " The patient's  presenting symptoms include dyspnea. " The worrisome physical exam findings include tachycardia. " The initial radiographic and laboratory data are worrisome because of abnormal ECG. " The chronic co-morbidities include HTN and HL.   * I certify that at the point of admission it is my clinical judgment that the patient will require inpatient hospital care spanning beyond 2 midnights from the point of admission due to high intensity of service, high risk for further deterioration and high frequency of surveillance required.*    For questions or updates, please contact [place] Please consult [email or web address] for contact info under Cardiology/STEMI.    Signed, Meade Maw, MD  07/26/2020 2:31 AM

## 2020-08-03 NOTE — Consult Note (Signed)
[address]       [place] [address]             [phone]        Claudia E Swiatek [facility] Medical Record #[id number] Date of Birth: [birth year]  Referring: No ref. provider found Primary Care: Danna Hefty, DO Primary Cardiologist:Bridgette Harrell Gave, MD  Chief Complaint:    Chief Complaint  Patient presents with  . Shortness of Breath  . Chest Pain    History of Present Illness:      Logan Robertson is a 65 year old male patient who was being followed outpatient by Dr. Orvan Seen for evaluation of aneurysm.  He was seen in the office in March 2022 but had previously been seen in August 2021 for similar symptoms.  He had a CT of the chest and a follow-up echocardiogram which demonstrated a root aneurysm. He underwent cardiac catheterization which showed no obstructive coronary disease. He does have moderate to severe aortic valve regurgitation with a reduced EF.   He presented to the ED today with a complaint of increasing shortness of breath . He recently had a cardioversion on 4/18 but it was not successful. He has a history of paroxysmal atrial flutter with a documented EF of 35-40% as well as HTN, HLD, prior CVA, and CKD stage III.  He has had a feeling of fluttering in his chest status post his cardioversion. He also reports weakness, fatigue, and lightheadedness. He had had some intermittent chest pain that occurs even at rest. An echocardiogram was performed today which showed an estimated LVEF of 15% with severe TR and mild AI with severe dilatation of the ascending aorta measuring 61m. A CT Angio of the chest was then obtained which showed a hypodense material in the aortic arch on post contrast imaging, beginning at the proximal aortic arch and extending into the proximal descending aorta without a discrete luminal flap visualized. I reviewed the CT scan with Dr. AOrvan Seenand he his recommendation is medical management. 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   Patient recently emergently intubated this morning for acute decompensation with a concern for cardiogenic shock. He is in the cardiac ICU under the care of CCM.   Current Activity/ Functional Status: Patient was independent with mobility/ambulation, transfers, ADL's, IADL's.   Zubrod Score: At the time of surgery this patient's most appropriate activity status/level should be described as: []    0    Normal activity, no symptoms []    1    Restricted in physical strenuous activity but ambulatory, able to do out light work []    2    Ambulatory and capable of self care, unable to do work activities, up and about                 more than 50%  Of the time                            []    3    Only limited self care, in bed greater than 50% of waking hours [x]    4    Completely disabled, no self care, confined to bed or chair []    5    Moribund  Past Medical History:  Diagnosis Date  . Chronic kidney disease   . Hypertension   . Lyme disease   .  Myocardial infarction ([place])   . Sleep apnea     Past Surgical History:  Procedure Laterality Date  . ABLATION    . CARDIOVERSION N/A 07/31/2020   Procedure: CARDIOVERSION;  Surgeon: Lelon Perla, MD;  Location: [facility] ENDOSCOPY;  Service: Cardiovascular;  Laterality: N/A;  . RIGHT/LEFT HEART CATH AND CORONARY ANGIOGRAPHY N/A 09/30/2019   Procedure: RIGHT/LEFT HEART CATH AND CORONARY ANGIOGRAPHY;  Surgeon: Jettie Booze, MD;  Location: [facility] CV LAB;  Service: Cardiovascular;  Laterality: N/A;    Social History   Tobacco Use  Smoking Status Never Smoker  Smokeless Tobacco Never Used    Social History   Substance and Sexual Activity  Alcohol Use Not Currently     Allergies  Allergen Reactions  . Lisinopril Swelling    Angioedema - denies airway complications Tolerates ARBs    Current Facility-Administered Medications  Medication Dose Route Frequency  Provider Last Rate Last Admin  . 0.9 %  sodium chloride infusion  250 mL Intravenous Continuous Bowser, Laurel Dimmer, NP      . acetaminophen (TYLENOL) tablet 650 mg  650 mg Oral Q4H PRN Akhter, Lacie Scotts, MD      . albuterol (PROVENTIL) (2.5 MG/3ML) 0.083% nebulizer solution 2.5 mg  2.5 mg Nebulization Q2H PRN Bowser, Laurel Dimmer, NP      . amiodarone (NEXTERONE PREMIX) 360-4.14 MG/200ML-% (1.8 mg/mL) IV infusion  30 mg/hr Intravenous Continuous Akhter, Mohammed W, MD      . atorvastatin (LIPITOR) tablet 80 mg  80 mg Oral Daily Akhter, Mohammed W, MD      . chlorhexidine gluconate (MEDLINE KIT) (PERIDEX) 0.12 % solution 15 mL  15 mL Mouth Rinse BID Bowser, Laurel Dimmer, NP      . dexmedetomidine (PRECEDEX) 400 MCG/100ML (4 mcg/mL) infusion  0-1.2 mcg/kg/hr Intravenous Continuous Bowser, Laurel Dimmer, NP      . docusate (COLACE) 50 MG/5ML liquid 100 mg  100 mg Per Tube BID Bowser, Laurel Dimmer, NP      . fentaNYL (SUBLIMAZE) bolus via infusion 25-100 mcg  25-100 mcg Intravenous Q15 min PRN Bowser, Laurel Dimmer, NP      . fentaNYL 2567mg in NS 2570m(1031mml) infusion-PREMIX  0-200 mcg/hr Intravenous Continuous BowCristal GenerousP 5 mL/hr at 08/06/2020 0804 50 mcg/hr at 08/02/2020 0804  . MEDLINE mouth rinse  15 mL Mouth Rinse 10 times per day BowCristal GenerousP      . nitroGLYCERIN (NITROSTAT) SL tablet 0.4 mg  0.4 mg Sublingual Q5 Min x 3 PRN AkhPaticia Stackohammed W, MD      . norepinephrine (LEVOPHED) 4mg16m 250mL29mmix infusion  0-40 mcg/min Intravenous Titrated BowseCristal Generous168.8 mL/hr at 07/22/2020 0931 45 mcg/min at 08/09/2020 0931  . ondansetron (ZOFRAN) injection 4 mg  4 mg Intravenous Q6H PRN AkhteMeade Maw     . pantoprazole (PROTONIX) injection 40 mg  40 mg Intravenous Daily Bowser, Grace E, NP      . perflutren lipid microspheres (DEFINITY) IV suspension  1-10 mL Intravenous PRN AcharCherlynn KaiserD   1 mL at 07/28/2020 0830  . polyethylene glycol (MIRALAX / GLYCOLAX) packet 17 g  17 g Per Tube Daily Bowser,  GraceLaurel Dimmer     . vasopressin (PITRESSIN) 20 Units in sodium chloride 0.9 % 100 mL infusion-*FOR SHOCK*  0-0.04 Units/min Intravenous Continuous Bowser, GraceLaurel Dimmer12 mL/hr at 07/23/2020 0932 0.04 Units/min at 07/21/2020 0932    Medications Prior to Admission  Medication Sig Dispense Refill Last Dose  . acetaminophen (TYLENOL) 325 MG tablet Take 650 mg by mouth every 6 (six) hours as needed for moderate pain.      Marland Kitchen apixaban (ELIQUIS) 5 MG TABS tablet Take 1 tablet (5 mg total) by mouth 2 (two) times daily. 60 tablet 3   . atorvastatin (LIPITOR) 80 MG tablet Take 1 tablet (80 mg total) by mouth daily. (Patient not taking: Reported on 07/25/2020) 90 tablet 2   . empagliflozin (JARDIANCE) 10 MG TABS tablet Take 10 mg by mouth daily.     . hydrALAZINE (APRESOLINE) 50 MG tablet Take 50 mg by mouth 3 (three) times daily.     . metoprolol (TOPROL XL) 200 MG 24 hr tablet Take 1 tablet (200 mg total) by mouth daily. 90 tablet 1   . spironolactone (ALDACTONE) 25 MG tablet Take 1 tablet (25 mg total) by mouth at bedtime. 90 tablet 3   . torsemide (DEMADEX) 20 MG tablet Take 1 tablet (20 mg total) by mouth as needed (For swelling or weight gain of 3 lbs over night or 5lbs in a week). (Patient taking differently: Take 20 mg by mouth daily.) 30 tablet 3   . traZODone (DESYREL) 50 MG tablet Take 50 mg by mouth at bedtime as needed for sleep.     . valsartan (DIOVAN) 320 MG tablet Take 320 mg by mouth daily.       No family history on file.   Review of Systems:  Unable to be obtained due to patient intubated/sedated  ROS Pertinent items are noted in HPI.     Physical Exam: BP 112/84   Pulse (!) 43   Temp 98.9 F (37.2 C) (Oral)   Resp (!) 27   Ht 6' (1.829 m)   Wt 111 kg   SpO2 95%   BMI 33.19 kg/m   General appearance: critically ill, intubated Resp: coarse ventilator sounds Cardio: sinus bradycardia GI: soft, non-tender; bowel sounds normal; no masses,  no organomegaly Extremities: cool  extremities, no edema, palpable pulses Neurologic: intubated/sedated  Diagnostic Studies & Laboratory data:      Recent Radiology Findings:   DG Chest 2 View  Result Date: 08/02/2020 CLINICAL DATA:  Chest pain and shortness of breath EXAM: CHEST - 2 VIEW COMPARISON:  12/05/2019 FINDINGS: Cardiomegaly. Normal pleural spaces. No focal airspace consolidation or pulmonary edema. IMPRESSION: Cardiomegaly without focal airspace disease. Electronically Signed   By: Ulyses Jarred M.D.   On: 07/26/2020 01:19   CT HEAD WO CONTRAST  Result Date: 07/25/2020 CLINICAL DATA:  Mental status change. Post code with cardiogenic shock. Left-sided weakness. EXAM: CT HEAD WITHOUT CONTRAST TECHNIQUE: Contiguous axial images were obtained from the base of the skull through the vertex without intravenous contrast. COMPARISON:  None. FINDINGS: Brain: Mild atrophy.  Negative for hydrocephalus Moderately large hypodensity in the right occipital parietal lobe compatible with chronic lobar infarction. Negative for acute infarct, hemorrhage, mass Vascular: Negative for hyperdense vessel Skull: Negative Sinuses/Orbits: Paranasal sinuses clear. NG tube and endotracheal tube in place. Negative orbit. Other: None IMPRESSION: No acute abnormality. Chronic infarct right occipital parietal lobe. Electronically Signed   By: Franchot Gallo M.D.   On: 07/25/2020 09:28   ECHOCARDIOGRAM COMPLETE  Result Date: 07/22/2020    ECHOCARDIOGRAM REPORT   Patient Name:   Logan Robertson Date of Exam: 07/28/2020 Medical Rec #:  [id number]        Height:       72.0 in Accession #:  [id number]       Weight:       244.7 lb Date of Birth:  1955-06-20        BSA:          2.321 m Patient Age:    51 years         BP:           63/52 mmHg Patient Gender: M                HR:           83 bpm. Exam Location:  Inpatient Procedure: 2D Echo, Cardiac Doppler, Color Doppler and Intracardiac            Opacification Agent Indications:    Dyspnea  History:         Patient has prior history of Echocardiogram examinations, most                 recent 09/29/2019. Previous Myocardial Infarction; Risk                 Factors:Hypertension.  Sonographer:    [place] Referring Phys: [id number] Darreld Mclean  Sonographer Comments: Patient is morbidly obese. 09/30/19 cath 07/31/20 cardioversion IMPRESSIONS  1. Left ventricular ejection fraction, by estimation, is 15%. The left ventricle has severely decreased function. The left ventricle demonstrates global hypokinesis. The left ventricular internal cavity size was moderately dilated. Left ventricular diastolic parameters are indeterminate.  2. Right ventricular systolic function is normal. The right ventricular size is normal. There is mildly elevated pulmonary artery systolic pressure.  3. Left atrial size was mildly dilated.  4. The mitral valve is normal in structure. Mild mitral valve regurgitation. No evidence of mitral stenosis.  5. Tricuspid valve regurgitation is severe.  6. The aortic valve is normal in structure. Aortic valve regurgitation is mild. No aortic stenosis is present.  7. Consider f/u CTA to evaluate aorta . Aortic dilatation noted. There is severe dilatation of the ascending aorta, measuring 54 mm.  8. The inferior vena cava is normal in size with greater than 50% respiratory variability, suggesting right atrial pressure of 3 mmHg. FINDINGS  Left Ventricle: Left ventricular ejection fraction, by estimation, is 15%. The left ventricle has severely decreased function. The left ventricle demonstrates global hypokinesis. Definity contrast agent was given IV to delineate the left ventricular endocardial borders. The left ventricular internal cavity size was moderately dilated. There is no left ventricular hypertrophy. Left ventricular diastolic parameters are indeterminate. Right Ventricle: The right ventricular size is normal. No increase in right ventricular wall thickness. Right ventricular systolic function  is normal. There is mildly elevated pulmonary artery systolic pressure. The tricuspid regurgitant velocity is 2.69  m/s, and with an assumed right atrial pressure of 8 mmHg, the estimated right ventricular systolic pressure is 32.5 mmHg. Left Atrium: Left atrial size was mildly dilated. Right Atrium: Right atrial size was normal in size. Pericardium: There is no evidence of pericardial effusion. Mitral Valve: The mitral valve is normal in structure. Mild mitral valve regurgitation. No evidence of mitral valve stenosis. Tricuspid Valve: The tricuspid valve is normal in structure. Tricuspid valve regurgitation is severe. No evidence of tricuspid stenosis. Aortic Valve: The aortic valve is normal in structure. Aortic valve regurgitation is mild. Aortic regurgitation PHT measures 2242 msec. No aortic stenosis is present. Aortic valve mean gradient measures 1.5 mmHg. Aortic valve peak gradient measures 2.3 mmHg. Aortic valve area, by VTI measures 3.20 cm. Pulmonic Valve: The  pulmonic valve was normal in structure. Pulmonic valve regurgitation is mild. No evidence of pulmonic stenosis. Aorta: Consider f/u CTA to evaluate aorta. The aortic root is normal in size and structure and aortic dilatation noted. There is severe dilatation of the ascending aorta, measuring 54 mm. Venous: The inferior vena cava is normal in size with greater than 50% respiratory variability, suggesting right atrial pressure of 3 mmHg. IAS/Shunts: No atrial level shunt detected by color flow Doppler.  LEFT VENTRICLE PLAX 2D LVIDd:         5.60 cm      Diastology LVIDs:         5.00 cm      LV e' medial:    3.26 cm/s LV PW:         1.50 cm      LV E/e' medial:  27.5 LV IVS:        1.00 cm      LV e' lateral:   5.12 cm/s LVOT diam:     2.30 cm      LV E/e' lateral: 17.5 LV SV:         35 LV SV Index:   15 LVOT Area:     4.15 cm  LV Volumes (MOD) LV vol d, MOD A4C: 175.0 ml LV vol s, MOD A4C: 174.0 ml LV SV MOD A4C:     175.0 ml RIGHT VENTRICLE RV S  prime:     9.81 cm/s TAPSE (M-mode): 1.3 cm LEFT ATRIUM              Index       RIGHT ATRIUM           Index LA Vol (A2C):   136.0 ml 58.59 ml/m RA Area:     25.70 cm LA Vol (A4C):   106.0 ml 45.66 ml/m RA Volume:   87.80 ml  37.82 ml/m LA Biplane Vol: 121.0 ml 52.13 ml/m  AORTIC VALVE                   PULMONIC VALVE AV Area (Vmax):    3.39 cm    PV Vmax:       0.58 m/s AV Area (Vmean):   3.20 cm    PV Vmean:      43.100 cm/s AV Area (VTI):     3.20 cm    PV VTI:        0.118 m AV Vmax:           75.30 cm/s  PV Peak grad:  1.3 mmHg AV Vmean:          49.200 cm/s PV Mean grad:  1.0 mmHg AV VTI:            0.108 m AV Peak Grad:      2.3 mmHg AV Mean Grad:      1.5 mmHg LVOT Vmax:         61.35 cm/s LVOT Vmean:        37.900 cm/s LVOT VTI:          0.083 m LVOT/AV VTI ratio: 0.77 AI PHT:            2242 msec  AORTA Ao Root diam: 4.10 cm Ao Asc diam:  5.10 cm MITRAL VALVE                 TRICUSPID VALVE MV Area (PHT): 4.06 cm      TR Peak grad:   28.9 mmHg MV  Decel Time: 187 msec      TR Vmax:        269.00 cm/s MR Peak grad:    54.5 mmHg MR Mean grad:    35.0 mmHg   SHUNTS MR Vmax:         369.00 cm/s Systemic VTI:  0.08 m MR Vmean:        275.0 cm/s  Systemic Diam: 2.30 cm MR PISA:         0.57 cm MR PISA Eff ROA: 3 mm MR PISA Radius:  0.30 cm MV E velocity: 89.50 cm/s MV A velocity: 22.30 cm/s MV E/A ratio:  4.01 Jenkins Rouge MD Electronically signed by Jenkins Rouge MD Signature Date/Time: 08/01/2020/8:53:49 AM    Final    CT Angio Chest/Abd/Pel for Dissection W and/or W/WO  Result Date: 08/12/2020 CLINICAL DATA:  Known thoracic aortic aneurysm assess for complications/dissection. EXAM: CT ANGIOGRAPHY CHEST, ABDOMEN AND PELVIS TECHNIQUE: Non-contrast CT of the chest was initially obtained. Multidetector CT imaging through the chest, abdomen and pelvis was performed using the standard protocol during bolus administration of intravenous contrast. Multiplanar reconstructed images and MIPs were obtained  and reviewed to evaluate the vascular anatomy. CONTRAST:  189m OMNIPAQUE IOHEXOL 350 MG/ML SOLN COMPARISON:  CT chest July 24, 2020. FINDINGS: CTA CHEST FINDINGS Cardiovascular: No evidence of intramural hematoma on precontrast imaging. Unchanged size of the ascending aortic aneurysm measuring 5.4 cm on image 61/7. At the aortic arch on postcontrast imaging there is some hypodense fluid beginning at the proximal aortic arch and extending into the proximal descending aorta without a discrete luminal flap visualized, this is favored to represent mixing artifact due to additional contrast bolus related to patient's port cardiac output. However, given that the proximal aspect of the defect is sharper and more distinct than would be expected for this type of artifact an early dissection flap is not completely excluded if continued clinical concern for acute aortic syndrome consider further evaluation with cardiac ECHO. Reflux of contrast into the IVC and hepatic veins. Aortic atherosclerosis. Cardiomegaly. No suspicious intracardiac filling defects. No significant pericardial effusion/thickening. Mediastinum/Nodes: No discrete thyroid nodularity. No pathologically enlarged mediastinal, hilar or axillary lymph nodes. Endotracheal tube with tip in the distal thoracic trachea. Enteric tube with tip in the stomach. Lungs/Pleura: Small bilateral pleural effusions. Left greater than right lower lobe airspace consolidation. Left upper lobe airspace opacity with multiple other small patchy areas of airspace opacities. No pneumothorax. Musculoskeletal: Diffuse idiopathic skeletal hyperostosis. No acute osseous abnormality. Review of the MIP images confirms the above findings. CTA ABDOMEN AND PELVIS FINDINGS VASCULAR: The abdominal vasculature is poorly opacified secondary to patient's cardiac dysfunction, within this context. Aorta: Aortic atherosclerosis.  No abdominal aortic aneurysm. Celiac: Patent without evidence of  aneurysm, dissection, vasculitis or significant stenosis. SMA: Patent without evidence of aneurysm, dissection, vasculitis or significant stenosis. Renals: Both renal arteries are patent without evidence of aneurysm, dissection, vasculitis, fibromuscular dysplasia or significant stenosis. IMA: Patent without evidence of aneurysm, dissection, vasculitis or significant stenosis. Inflow: Patent without evidence of aneurysm, dissection, vasculitis or significant stenosis. Veins: No obvious venous abnormality within the limitations of this arterial phase study. Review of the MIP images confirms the above findings. NON-VASCULAR Hepatobiliary: No suspicious hepatic lesion. Reflux of contrast into the hepatic veins. Gallbladder is mildly distended without overt wall thickening, pericholecystic fluid or radiopaque calculi. No biliary ductal dilation. Pancreas: Unremarkable. No pancreatic ductal dilatation or surrounding inflammatory changes. Spleen: Within normal limits. Adrenals/Urinary Tract: Bilateral adrenal glands are  unremarkable. No hydronephrosis. Right kidneys unremarkable. Hypodense 3.3 cm left lower pole renal lesion and exophytic 1.8 cm interpolar renal lesion, most consistent with a renal cyst. Urinary bladder is decompressed limiting evaluation. Stomach/Bowel: Enteric tube in the stomach. Otherwise stomach is grossly unremarkable. No suspicious small bowel wall thickening or dilation. Normal appendix. No suspicious colonic wall thickening or mass like lesions. Lymphatic: No gastrohepatic or hepatoduodenal ligament lymphadenopathy. No retroperitoneal or mesenteric lymphadenopathy. No pelvic sidewall lymphadenopathy. No groin lymphadenopathy. Reproductive: Prostate is unremarkable. Other: No abdominopelvic ascites. Musculoskeletal: Mild lumbar spondylosis. No acute osseous abnormality. Review of the MIP images confirms the above findings. IMPRESSION: 1. Hypodense material in the aortic arch on postcontrast  imaging, beginning at the proximal aortic arch and extending into the proximal descending aorta without a discrete luminal flap visualized. Which is favored represent mixing artifact due to multiple contrast boluses and poor cardiac output. However, given that the proximal aspect of the defect is sharper and more distinct than would be expected for this type of artifact an early dissection flap is not completely excluded. If continued clinical concern for acute aortic syndrome consider further evaluation with cardiac ECHO. 2. Unchanged size of the ascending aortic aneurysm measuring 5.4 cm. No intramural hematoma or abdominal aortic aneurysm. 3. Small bilateral pleural effusions with multifocal airspace consolidations, most consistent with multifocal pneumonia. 4. Cardiomegaly with reflux of contrast into the hepatic veins and IVC indicative of poor cardiac function. 5. Mildly distended gallbladder without overt wall thickening, pericholecystic fluid or radiopaque calculi. Recommend correlation with right upper quadrant and possible ultrasound. Electronically Signed   By: Dahlia Bailiff MD   On: 07/15/2020 10:05     I have independently reviewed the above radiologic studies and discussed with the patient   Recent Lab Findings: Lab Results  Component Value Date   WBC 8.8 07/27/2020   HGB 11.6 (L) 07/14/2020   HCT 33.3 (L) 08/02/2020   PLT 211 08/04/2020   GLUCOSE 134 (H) 08/02/2020   CHOL 129 07/25/2020   TRIG 60 08/01/2020   HDL 38 (L) 07/24/2020   LDLCALC 79 07/19/2020   ALT 33 07/19/2020   AST 27 07/25/2020   NA 137 08/10/2020   K 3.0 (L) 08/07/2020   CL 102 07/23/2020   CREATININE 2.23 (H) 08/02/2020   BUN 49 (H) 08/12/2020   CO2 26 08/10/2020   TSH 1.820 05/24/2020   INR 1.1 09/29/2019   HGBA1C 5.5 05/16/2020      Assessment / Plan:      1. Suspected Type B aortic dissection- TEE ordered for this morning to gather more information. CT scan suggestive of intramural hematoma.  Reviewed CT with Dr. Orvan Seen who determined the patient to not be a surgical candidate. Curb-side consult to vascular surgery who also determined this patient to not be a surgical candidate. On Norepi, titration per CCM. 2. Acute respiratory failure-currently intubated/sedated 3. Acute encephalopathy 4. Uncontrolled atrial flutter s/p unsuccessful CV on 4/18, now with bradycardia 5. Acute on chronic HF with recent EF 15% 6. Hypokalemia-perCCM 7. Hypocalcemia-per CCM  Plan: Optimize medical management and recommend goals of care meeting with family. Cardiology to perform a TEE this morning, however it is unlikely to change our recommendations.    I  spent 20 minutes counseling the patient face to face.   Nicholes Rough, PA-C  07/15/2020 10:10 AM

## 2020-08-03 NOTE — Telephone Encounter (Signed)
Will route to NL as this is a Dr. Harrell Gave pt.  Was seen by Dr. Gasper Sells as a DOD pt.  Pt currently admitted at [facility].

## 2020-08-03 NOTE — ED Triage Notes (Signed)
Emergency Medicine Provider Triage Evaluation Note  Logan Robertson , a 65 y.o. male  was evaluated in triage.  Pt complains of dyspnea x 1-2 days. Dyspnea with intermittent chest discomfort described as squeezing, lasts 10-15 minutes at a time. No specific alleviating/aggravating factors. Also reports abdominal cramping, fatigue, and intermittent lightheadedness.   Review of Systems  Positive: Dyspnea, chest pain, lightheaded, abdominal discomfort Negative: Vomiting, hemoptysis, fever, syncope.   Physical Exam  Ht 6' (1.829 m)   Wt 111 kg   SpO2 97%   BMI 33.19 kg/m  Gen:   Awake, no distress   HEENT:  Atraumatic  Resp:  Normal effort  Cardiac:  Tachycardic.  Abd:   Nondistended no peritoneal signs  MSK:   Moves extremities  Neuro:  Speech clear   Medical Decision Making  Medically screening exam initiated at 12:55 AM.  Appropriate orders placed.  Logan Robertson was informed that the remainder of the evaluation will be completed by another provider, this initial triage assessment does not replace that evaluation, and the importance of remaining in the ED until their evaluation is complete.  Clinical Impression  Shortness of breath    Amaryllis Dyke, PA-C 08/07/2020 [id number]

## 2020-08-03 NOTE — ED Notes (Signed)
Pt tachypenic at this time, pt reports this is baseline when pt changes environment, respirations ranging from even and unlabored 12-18 per minute, to 27-30 per minute with labored breathing and non productive cough

## 2020-08-03 NOTE — Consult Note (Addendum)
Advanced Heart Failure Team Consult Note   Primary Physician: Danna Hefty, DO PCP-Cardiologist:  Buford Dresser, MD  CT Surgery; Dr Orvan Seen   Reason for Consultation: Shock   HPI:    Logan Robertson is seen today for evaluation of shock  at the request of Dr Tamala Julian.   Logan Robertson is a 65 year old with a history of PAF, CKD Stage III, Ascending Thoracic  Aneurysm, hyperlipidemia, HTN, OSA, CVA, and HFrEF.   Echo 2018 EF >55%  Echo 09/2019 EF 35-40% RV normal, AV regurgitation moderate, Aortic dilatation, severe dilatation on ascending aorta.   RHC/LHC 09/2019- no coronary disease.  CO 6.9 CI 2.8   He was initially seen by Dr Orvan Seen in August 2021 for evaluation for aneurysm and had follow up in March of this year. He was set up for CT chest. CTA 07/24/20 ascending aorta 5.4 cm on the axial images wiith recommendations for semi annual by CTA or MRA.   On 4/18 he presented for ECHO but was in A flutter RVR. Cardioversion was attempted but went back in A flutter.    Admitted today with A flutter RVR and respiratory failure. BNP >2000, HS Trop 454>420> 427. Lactic acid 8.6  CTA today - concerning for early dissection. ECHO earlier today with severely reduced EF 10-20% and dilated ascending aorta. TEE ordered to further evaluate. Required urgent intubation. Placed on norepi.   Review of Systems: [y] = yes, [ ]  = no Patient is encephalopathic and or intubated. Therefore history has been obtained from chart review.    . General: Weight gain [ ] ; Weight loss [ ] ; Anorexia [ ] ; Fatigue [ ] ; Fever [ ] ; Chills [ ] ; Weakness [ ]   . Cardiac: Chest pain/pressure [ ] ; Resting SOB [ ] ; Exertional SOB [ ] ; Orthopnea [ ] ; Pedal Edema [ ] ; Palpitations [ ] ; Syncope [ ] ; Presyncope [ ] ; Paroxysmal nocturnal dyspnea[ ]   . Pulmonary: Cough [ ] ; Wheezing[ ] ; Hemoptysis[ ] ; Sputum [ ] ; Snoring [ ]   . GI: Vomiting[ ] ; Dysphagia[ ] ; Melena[ ] ; Hematochezia [ ] ; Heartburn[ ] ; Abdominal pain [ ] ;  Constipation [ ] ; Diarrhea [ ] ; BRBPR [ ]   . GU: Hematuria[ ] ; Dysuria [ ] ; Nocturia[ ]   . Vascular: Pain in legs with walking [ ] ; Pain in feet with lying flat [ ] ; Non-healing sores [ ] ; Stroke [ ] ; TIA [ ] ; Slurred speech [ ] ;  . Neuro: Headaches[ ] ; Vertigo[ ] ; Seizures[ ] ; Paresthesias[ ] ;Blurred vision [ ] ; Diplopia [ ] ; Vision changes [ ]   . Ortho/Skin: Arthritis [ ] ; Joint pain [ ] ; Muscle pain [ ] ; Joint swelling [ ] ; Back Pain [ ] ; Rash [ ]   . Psych: Depression[ ] ; Anxiety[ ]   . Heme: Bleeding problems [ ] ; Clotting disorders [ ] ; Anemia [ ]   . Endocrine: Diabetes [ ] ; Thyroid dysfunction[ ]   Home Medications Prior to Admission medications   Medication Sig Start Date End Date Taking? Authorizing Provider  acetaminophen (TYLENOL) 325 MG tablet Take 650 mg by mouth every 6 (six) hours as needed for moderate pain.     [provider]  apixaban (ELIQUIS) 5 MG TABS tablet Take 1 tablet (5 mg total) by mouth 2 (two) times daily. 05/16/20 06/15/20  Mullis, Kiersten P, DO  atorvastatin (LIPITOR) 80 MG tablet Take 1 tablet (80 mg total) by mouth daily. Patient not taking: Reported on 07/25/2020 05/16/20   Mina Marble P, DO  empagliflozin (JARDIANCE) 10 MG TABS tablet  Take 10 mg by mouth daily.    [provider]  hydrALAZINE (APRESOLINE) 50 MG tablet Take 50 mg by mouth 3 (three) times daily.    [provider]  metoprolol (TOPROL XL) 200 MG 24 hr tablet Take 1 tablet (200 mg total) by mouth daily. 07/20/20   Werner Lean, MD  spironolactone (ALDACTONE) 25 MG tablet Take 1 tablet (25 mg total) by mouth at bedtime. 11/04/19   Leavy Cella, RPH-CPP  torsemide (DEMADEX) 20 MG tablet Take 1 tablet (20 mg total) by mouth as needed (For swelling or weight gain of 3 lbs over night or 5lbs in a week). Patient taking differently: Take 20 mg by mouth daily. 05/24/20   Buford Dresser, MD  traZODone (DESYREL) 50 MG tablet Take 50 mg by mouth at bedtime as needed  for sleep.    [provider]  valsartan (DIOVAN) 320 MG tablet Take 320 mg by mouth daily. 05/14/19   [provider]    Past Medical History: Past Medical History:  Diagnosis Date  . Chronic kidney disease   . Hypertension   . Lyme disease   . Myocardial infarction ([place])   . Sleep apnea     Past Surgical History: Past Surgical History:  Procedure Laterality Date  . ABLATION    . CARDIOVERSION N/A 07/31/2020   Procedure: CARDIOVERSION;  Surgeon: Lelon Perla, MD;  Location: [facility] ENDOSCOPY;  Service: Cardiovascular;  Laterality: N/A;  . RIGHT/LEFT HEART CATH AND CORONARY ANGIOGRAPHY N/A 09/30/2019   Procedure: RIGHT/LEFT HEART CATH AND CORONARY ANGIOGRAPHY;  Surgeon: Jettie Booze, MD;  Location: [facility] LAB;  Service: Cardiovascular;  Laterality: N/A;    Family History: No family history on file.  Social History: Social History   Socioeconomic History  . Marital status: Single    Spouse name: Not on file  . Number of children: Not on file  . Years of education: Not on file  . Highest education level: Not on file  Occupational History  . Not on file  Tobacco Use  . Smoking status: Never Smoker  . Smokeless tobacco: Never Used  Substance and Sexual Activity  . Alcohol use: Not Currently  . Drug use: Not Currently  . Sexual activity: Not on file  Other Topics Concern  . Not on file  Social History Narrative  . Not on file   Social Determinants of Health   Financial Resource Strain: Not on file  Food Insecurity: Not on file  Transportation Needs: Not on file  Physical Activity: Not on file  Stress: Not on file  Social Connections: Not on file    Allergies:  Allergies  Allergen Reactions  . Lisinopril Swelling    Angioedema - denies airway complications Tolerates ARBs    Objective:    Vital Signs:   Temp:  [98.9 F (37.2 C)] 98.9 F (37.2 C) (04/21 0103) Pulse Rate:  [43-124] 43 (04/21 0710) Resp:  [11-44] 27  (04/21 0720) BP: (72-155)/(52-122) 112/84 (04/21 0720) SpO2:  [95 %-100 %] 95 % (04/21 0732) FiO2 (%):  [40 %-100 %] 40 % (04/21 0732) Weight:  [161 kg] 111 kg (04/21 0051)    Weight change: Filed Weights   07/17/2020 0051  Weight: 111 kg    Intake/Output:   Intake/Output Summary (Last 24 hours) at 07/15/2020 1116 Last data filed at 08/12/2020 0808 Gross per 24 hour  Intake 12.46 ml  Output --  Net 12.46 ml      Physical Exam  General:  Intubated HEENT: normal Neck: supple. JVP . Carotids 2+ bilat; no bruits. No lymphadenopathy or thyromegaly appreciated. Cor: PMI nondisplaced. Regular rate & rhythm. No rubs, gallops or murmurs. Lungs: clear Abdomen: soft, nontender, nondistended. No hepatosplenomegaly. No bruits or masses. Good bowel sounds. Extremities: cool, no cyanosis, clubbing, rash, edema Neuro:Intubated/Sedated    Telemetry   A fib 70-80s   EKG   A fib 113    Labs   Basic Metabolic Panel: Recent Labs  Lab 07/31/20 0810 07/29/2020 0100 08/11/2020 0232 08/11/2020 0647  NA 138 138  --  137  K 3.9 3.1*  --  3.0*  CL 101 102  --   --   CO2  --  26  --   --   GLUCOSE 105* 134*  --   --   BUN 67* 49*  --   --   CREATININE 2.50* 2.23*  --   --   CALCIUM  --  8.8*  --   --   MG  --   --  2.3  --     Liver Function Tests: Recent Labs  Lab 07/19/2020 0100  AST 27  ALT 33  ALKPHOS 40  BILITOT 1.9*  PROT 6.5  ALBUMIN 2.8*   No results for input(s): LIPASE, AMYLASE in the last 168 hours. No results for input(s): AMMONIA in the last 168 hours.  CBC: Recent Labs  Lab 07/31/20 0810 08/07/2020 0100 07/14/2020 0647 07/14/2020 0653  WBC  --  11.1*  --  8.8  NEUTROABS  --  9.0*  --   --   HGB 13.6 13.6 12.2* 11.6*  HCT 40.0 37.9* 36.0* 33.3*  MCV  --  91.8  --  94.1  PLT  --  238  --  211    Cardiac Enzymes: No results for input(s): CKTOTAL, CKMB, CKMBINDEX, TROPONINI in the last 168 hours.  BNP: BNP (last 3 results) Recent Labs    09/28/19 1302  12/06/19 0916 07/14/2020 0100  BNP 343.3* 495.5* 2,066.0*    ProBNP (last 3 results) No results for input(s): PROBNP in the last 8760 hours.   CBG: No results for input(s): GLUCAP in the last 168 hours.  Coagulation Studies: No results for input(s): LABPROT, INR in the last 72 hours.   Imaging   DG Chest 2 View  Result Date: 07/16/2020 CLINICAL DATA:  Chest pain and shortness of breath EXAM: CHEST - 2 VIEW COMPARISON:  12/05/2019 FINDINGS: Cardiomegaly. Normal pleural spaces. No focal airspace consolidation or pulmonary edema. IMPRESSION: Cardiomegaly without focal airspace disease. Electronically Signed   By: Ulyses Jarred M.D.   On: 07/14/2020 01:19   CT HEAD WO CONTRAST  Result Date: 07/21/2020 CLINICAL DATA:  Mental status change. Post code with cardiogenic shock. Left-sided weakness. EXAM: CT HEAD WITHOUT CONTRAST TECHNIQUE: Contiguous axial images were obtained from the base of the skull through the vertex without intravenous contrast. COMPARISON:  None. FINDINGS: Brain: Mild atrophy.  Negative for hydrocephalus Moderately large hypodensity in the right occipital parietal lobe compatible with chronic lobar infarction. Negative for acute infarct, hemorrhage, mass Vascular: Negative for hyperdense vessel Skull: Negative Sinuses/Orbits: Paranasal sinuses clear. NG tube and endotracheal tube in place. Negative orbit. Other: None IMPRESSION: No acute abnormality. Chronic infarct right occipital parietal lobe. Electronically Signed   By: Franchot Gallo M.D.   On: 08/09/2020 09:28   ECHOCARDIOGRAM COMPLETE  Result Date: 07/23/2020    ECHOCARDIOGRAM REPORT   Patient Name:   Ronny Flurry Date of Exam:  07/19/2020 Medical Rec #:  [id number]        Height:       72.0 in Accession #:    [id number]       Weight:       244.7 lb Date of Birth:  [birth year]        BSA:          2.321 m Patient Age:    44 years         BP:           63/52 mmHg Patient Gender: M                HR:           83 bpm.  Exam Location:  Inpatient Procedure: 2D Echo, Cardiac Doppler, Color Doppler and Intracardiac            Opacification Agent Indications:    Dyspnea  History:        Patient has prior history of Echocardiogram examinations, most                 recent 09/29/2019. Previous Myocardial Infarction; Risk                 Factors:Hypertension.  Sonographer:    Havana Referring Phys: [id number] Darreld Juergen Hardenbrook  Sonographer Comments: Patient is morbidly obese. 09/30/19 cath 07/31/20 cardioversion IMPRESSIONS  1. Left ventricular ejection fraction, by estimation, is 15%. The left ventricle has severely decreased function. The left ventricle demonstrates global hypokinesis. The left ventricular internal cavity size was moderately dilated. Left ventricular diastolic parameters are indeterminate.  2. Right ventricular systolic function is normal. The right ventricular size is normal. There is mildly elevated pulmonary artery systolic pressure.  3. Left atrial size was mildly dilated.  4. The mitral valve is normal in structure. Mild mitral valve regurgitation. No evidence of mitral stenosis.  5. Tricuspid valve regurgitation is severe.  6. The aortic valve is normal in structure. Aortic valve regurgitation is mild. No aortic stenosis is present.  7. Consider f/u CTA to evaluate aorta . Aortic dilatation noted. There is severe dilatation of the ascending aorta, measuring 54 mm.  8. The inferior vena cava is normal in size with greater than 50% respiratory variability, suggesting right atrial pressure of 3 mmHg. FINDINGS  Left Ventricle: Left ventricular ejection fraction, by estimation, is 15%. The left ventricle has severely decreased function. The left ventricle demonstrates global hypokinesis. Definity contrast agent was given IV to delineate the left ventricular endocardial borders. The left ventricular internal cavity size was moderately dilated. There is no left ventricular hypertrophy. Left ventricular diastolic  parameters are indeterminate. Right Ventricle: The right ventricular size is normal. No increase in right ventricular wall thickness. Right ventricular systolic function is normal. There is mildly elevated pulmonary artery systolic pressure. The tricuspid regurgitant velocity is 2.69  m/s, and with an assumed right atrial pressure of 8 mmHg, the estimated right ventricular systolic pressure is 93.9 mmHg. Left Atrium: Left atrial size was mildly dilated. Right Atrium: Right atrial size was normal in size. Pericardium: There is no evidence of pericardial effusion. Mitral Valve: The mitral valve is normal in structure. Mild mitral valve regurgitation. No evidence of mitral valve stenosis. Tricuspid Valve: The tricuspid valve is normal in structure. Tricuspid valve regurgitation is severe. No evidence of tricuspid stenosis. Aortic Valve: The aortic valve is normal in structure. Aortic valve regurgitation is mild. Aortic regurgitation PHT measures 2242 msec. No aortic stenosis  is present. Aortic valve mean gradient measures 1.5 mmHg. Aortic valve peak gradient measures 2.3 mmHg. Aortic valve area, by VTI measures 3.20 cm. Pulmonic Valve: The pulmonic valve was normal in structure. Pulmonic valve regurgitation is mild. No evidence of pulmonic stenosis. Aorta: Consider f/u CTA to evaluate aorta. The aortic root is normal in size and structure and aortic dilatation noted. There is severe dilatation of the ascending aorta, measuring 54 mm. Venous: The inferior vena cava is normal in size with greater than 50% respiratory variability, suggesting right atrial pressure of 3 mmHg. IAS/Shunts: No atrial level shunt detected by color flow Doppler.  LEFT VENTRICLE PLAX 2D LVIDd:         5.60 cm      Diastology LVIDs:         5.00 cm      LV e' medial:    3.26 cm/s LV PW:         1.50 cm      LV E/e' medial:  27.5 LV IVS:        1.00 cm      LV e' lateral:   5.12 cm/s LVOT diam:     2.30 cm      LV E/e' lateral: 17.5 LV SV:          35 LV SV Index:   15 LVOT Area:     4.15 cm  LV Volumes (MOD) LV vol d, MOD A4C: 175.0 ml LV vol s, MOD A4C: 174.0 ml LV SV MOD A4C:     175.0 ml RIGHT VENTRICLE RV S prime:     9.81 cm/s TAPSE (M-mode): 1.3 cm LEFT ATRIUM              Index       RIGHT ATRIUM           Index LA Vol (A2C):   136.0 ml 58.59 ml/m RA Area:     25.70 cm LA Vol (A4C):   106.0 ml 45.66 ml/m RA Volume:   87.80 ml  37.82 ml/m LA Biplane Vol: 121.0 ml 52.13 ml/m  AORTIC VALVE                   PULMONIC VALVE AV Area (Vmax):    3.39 cm    PV Vmax:       0.58 m/s AV Area (Vmean):   3.20 cm    PV Vmean:      43.100 cm/s AV Area (VTI):     3.20 cm    PV VTI:        0.118 m AV Vmax:           75.30 cm/s  PV Peak grad:  1.3 mmHg AV Vmean:          49.200 cm/s PV Mean grad:  1.0 mmHg AV VTI:            0.108 m AV Peak Grad:      2.3 mmHg AV Mean Grad:      1.5 mmHg LVOT Vmax:         61.35 cm/s LVOT Vmean:        37.900 cm/s LVOT VTI:          0.083 m LVOT/AV VTI ratio: 0.77 AI PHT:            2242 msec  AORTA Ao Root diam: 4.10 cm Ao Asc diam:  5.10 cm MITRAL VALVE  TRICUSPID VALVE MV Area (PHT): 4.06 cm      TR Peak grad:   28.9 mmHg MV Decel Time: 187 msec      TR Vmax:        269.00 cm/s Logan Peak grad:    54.5 mmHg Logan Mean grad:    35.0 mmHg   SHUNTS Logan Vmax:         369.00 cm/s Systemic VTI:  0.08 m Logan Vmean:        275.0 cm/s  Systemic Diam: 2.30 cm Logan PISA:         0.57 cm Logan PISA Eff ROA: 3 mm Logan PISA Radius:  0.30 cm MV E velocity: 89.50 cm/s MV A velocity: 22.30 cm/s MV E/A ratio:  4.01 Jenkins Rouge MD Electronically signed by Jenkins Rouge MD Signature Date/Time: 07/26/2020/8:53:49 AM    Final    CT Angio Chest/Abd/Pel for Dissection W and/or W/WO  Result Date: 07/31/2020 CLINICAL DATA:  Known thoracic aortic aneurysm assess for complications/dissection. EXAM: CT ANGIOGRAPHY CHEST, ABDOMEN AND PELVIS TECHNIQUE: Non-contrast CT of the chest was initially obtained. Multidetector CT imaging through the chest,  abdomen and pelvis was performed using the standard protocol during bolus administration of intravenous contrast. Multiplanar reconstructed images and MIPs were obtained and reviewed to evaluate the vascular anatomy. CONTRAST:  1102m OMNIPAQUE IOHEXOL 350 MG/ML SOLN COMPARISON:  CT chest July 24, 2020. FINDINGS: CTA CHEST FINDINGS Cardiovascular: No evidence of intramural hematoma on precontrast imaging. Unchanged size of the ascending aortic aneurysm measuring 5.4 cm on image 61/7. At the aortic arch on postcontrast imaging there is some hypodense fluid beginning at the proximal aortic arch and extending into the proximal descending aorta without a discrete luminal flap visualized, this is favored to represent mixing artifact due to additional contrast bolus related to patient's port cardiac output. However, given that the proximal aspect of the defect is sharper and more distinct than would be expected for this type of artifact an early dissection flap is not completely excluded if continued clinical concern for acute aortic syndrome consider further evaluation with cardiac ECHO. Reflux of contrast into the IVC and hepatic veins. Aortic atherosclerosis. Cardiomegaly. No suspicious intracardiac filling defects. No significant pericardial effusion/thickening. Mediastinum/Nodes: No discrete thyroid nodularity. No pathologically enlarged mediastinal, hilar or axillary lymph nodes. Endotracheal tube with tip in the distal thoracic trachea. Enteric tube with tip in the stomach. Lungs/Pleura: Small bilateral pleural effusions. Left greater than right lower lobe airspace consolidation. Left upper lobe airspace opacity with multiple other small patchy areas of airspace opacities. No pneumothorax. Musculoskeletal: Diffuse idiopathic skeletal hyperostosis. No acute osseous abnormality. Review of the MIP images confirms the above findings. CTA ABDOMEN AND PELVIS FINDINGS VASCULAR: The abdominal vasculature is poorly  opacified secondary to patient's cardiac dysfunction, within this context. Aorta: Aortic atherosclerosis.  No abdominal aortic aneurysm. Celiac: Patent without evidence of aneurysm, dissection, vasculitis or significant stenosis. SMA: Patent without evidence of aneurysm, dissection, vasculitis or significant stenosis. Renals: Both renal arteries are patent without evidence of aneurysm, dissection, vasculitis, fibromuscular dysplasia or significant stenosis. IMA: Patent without evidence of aneurysm, dissection, vasculitis or significant stenosis. Inflow: Patent without evidence of aneurysm, dissection, vasculitis or significant stenosis. Veins: No obvious venous abnormality within the limitations of this arterial phase study. Review of the MIP images confirms the above findings. NON-VASCULAR Hepatobiliary: No suspicious hepatic lesion. Reflux of contrast into the hepatic veins. Gallbladder is mildly distended without overt wall thickening, pericholecystic fluid or radiopaque calculi. No biliary ductal dilation.  Pancreas: Unremarkable. No pancreatic ductal dilatation or surrounding inflammatory changes. Spleen: Within normal limits. Adrenals/Urinary Tract: Bilateral adrenal glands are unremarkable. No hydronephrosis. Right kidneys unremarkable. Hypodense 3.3 cm left lower pole renal lesion and exophytic 1.8 cm interpolar renal lesion, most consistent with a renal cyst. Urinary bladder is decompressed limiting evaluation. Stomach/Bowel: Enteric tube in the stomach. Otherwise stomach is grossly unremarkable. No suspicious small bowel wall thickening or dilation. Normal appendix. No suspicious colonic wall thickening or mass like lesions. Lymphatic: No gastrohepatic or hepatoduodenal ligament lymphadenopathy. No retroperitoneal or mesenteric lymphadenopathy. No pelvic sidewall lymphadenopathy. No groin lymphadenopathy. Reproductive: Prostate is unremarkable. Other: No abdominopelvic ascites. Musculoskeletal: Mild lumbar  spondylosis. No acute osseous abnormality. Review of the MIP images confirms the above findings. IMPRESSION: 1. Hypodense material in the aortic arch on postcontrast imaging, beginning at the proximal aortic arch and extending into the proximal descending aorta without a discrete luminal flap visualized. Which is favored represent mixing artifact due to multiple contrast boluses and poor cardiac output. However, given that the proximal aspect of the defect is sharper and more distinct than would be expected for this type of artifact an early dissection flap is not completely excluded. If continued clinical concern for acute aortic syndrome consider further evaluation with cardiac ECHO. 2. Unchanged size of the ascending aortic aneurysm measuring 5.4 cm. No intramural hematoma or abdominal aortic aneurysm. 3. Small bilateral pleural effusions with multifocal airspace consolidations, most consistent with multifocal pneumonia. 4. Cardiomegaly with reflux of contrast into the hepatic veins and IVC indicative of poor cardiac function. 5. Mildly distended gallbladder without overt wall thickening, pericholecystic fluid or radiopaque calculi. Recommend correlation with right upper quadrant and possible ultrasound. Electronically Signed   By: Dahlia Bailiff MD   On: 07/17/2020 10:05      Medications:     Current Medications: . atorvastatin  80 mg Oral Daily  . chlorhexidine gluconate (MEDLINE KIT)  15 mL Mouth Rinse BID  . docusate  100 mg Per Tube BID  . mouth rinse  15 mL Mouth Rinse 10 times per day  . pantoprazole (PROTONIX) IV  40 mg Intravenous Daily  . polyethylene glycol  17 g Per Tube Daily     Infusions: . sodium chloride    . amiodarone    . dexmedetomidine (PRECEDEX) IV infusion    . fentaNYL infusion INTRAVENOUS 50 mcg/hr (07/19/2020 0804)  . norepinephrine (LEVOPHED) Adult infusion 40 mcg/min (08/06/2020 1015)  . vasopressin 0.04 Units/min (08/01/2020 0932)       Assessment/Plan   1.  Shock , cardiogenic shock --> possibly dissection type A Lactic acid 8.6  CO-OX 32%. On norepi + vasopressin - Start dobutamine 5 mcg.   2. A fib RVR Failed cardioversion 07/31/20  Rate controlled.  No anticoagulation for now until decision made about surgery.   3. A/C HFrEF , NICM  ECHO 2021 35-40%---> ECHO this admit EF ~15%  On vaso and norepi   4. CKD Stage IIIa Creatinine on admit 2.2 Looks like baseline < 2.   Length of Stay: 0  Darrick Grinder, NP  08/02/2020, 11:16 AM  Advanced Heart Failure Team Pager [phone] (M-F; 7a - 5p)  Please contact [facility] for night-coverage after hours (4p -7a ) and weekends on [email or web address]  Patient seen with NP, agree with the above note.   History as outlined above.  Patient is currently intubated and on NE 15 for cardiogenic shock.  Co-ox 32%, lactate 8.6.  SBP currently 130s on dobutamine.  Creatinine  this morning was 2.23, he is not making urine.    Echo today showed EF 15%, moderate RV systolic dysfunction, mild-moderate AI, 5.4 cm ascending aorta.  CT angiography chest showed hypodense material in the proximal arch => descending aorta, ?early dissectino versus mixing artifact.    I arrived at bedside and assisted Dr. Margaretann Loveless with TEE.  The TEE was difficult but does appear to show a dissection flap from ascending aorta to the descending aorta, I do not think that this can be explained away as artifact.  There is hypodense material in the descending aorta/distal arch, concern this is false lumen.   General: Intubated/sedated.  Neck: No JVD, no thyromegaly or thyroid nodule.  Lungs: Decreased at bases.  CV: Nondisplaced PMI.  Heart regular S1/S2, no S3/S4, no murmur.  No peripheral edema.  No carotid bruit. Unable to palpate pedal pulses.  Abdomen: Soft, no hepatosplenomegaly, no distention.  Skin: Intact without lesions or rashes.  Neurologic: Sedated on vent.  Psych: Normal affect. Extremities: Cool extremities.  HEENT: Normal.    1. Acute aortic syndrome: Patient had known 5.4 cm ascending aortic aneurysm with mild-moderate AI.  CT and TEE both showed abnormality involving distal ascending aorta through the arch to the descending aorta.  CTA was not clear but concerning for early dissection.  TEE was concerning for dissection flap starting distal ascending aorta. I discussed the patient with Drs Tami Ribas, and Margaretann Loveless.  We agree that the patient is not a surgical candidate for repair of his ascending aorta given severe biventricular failure and AKI with anuria.   2. Cardiogenic shock: Baseline nonischemic cardiomyopathy with normal coronaries in 6/21, prior echo with EF 35-40%.  Echo this admission with EF 15%, moderate RV dysfunction, mild-moderate AI.  He is in shock now on NE 15 => co-ox 32%, lactate 8.6.  BP currently stable on NE.  - Continue norepinephrine.  - Add dobutamine 5 mcg/kg/min. - Mechanical support not option at this time with concern for type A dissection.  3. Atrial flutter: Chronic, failed DCCV earlier this week.   - HR controlled, can leave off amiodarone.  - Hold Eliquis for now.  4. AKI: Creatinine up to 2.5 recently, possible contrast nephropathy post-CTA on 4/11.  Creatinine 2.23 today, not making urine.  Had another contrast bolus with CTA today.  I am concerned that the patient will have progressive contrast nephropathy.   Loralie Champagne 08/05/2020 12:47 PM

## 2020-08-03 NOTE — Progress Notes (Signed)
Dr. Orvan Seen, Dr. Aundra Dubin reviewed case, images, comorbidities.  They do not think patient would survive ascending aortic dissection repair.  Plan is supportive understanding high mortality.  I will relay to family.  Appreciate everyone's input.  Erskine Emery MD PCCM

## 2020-08-03 NOTE — Progress Notes (Signed)
Checked on throughout day, stable pressor needs.  Family at bedside. Continue current pressor and vent support through evening.  Goal systolic BP < 761, goal MAP > 65.  More family coming in overnight.  Liberalized visitation in place.  Re-discuss GOC in AM depending on how he and labs look.  Additional 35 minutes critical care time

## 2020-08-03 NOTE — ED Notes (Signed)
Roxanne Mins, MD made aware pt placed on O2 via [place]

## 2020-08-03 NOTE — Plan of Care (Signed)
PCCM called for acute decompensation -- 65 yo m HFrEF, known thoracic aortic aneurysm, Aflutter (unsuccessful CV monday prior to ED presentation)  In ED, became shocky, less responsive on BiPAP promptimg CCM consult  Intubated emergently by CCM, concern for cardiogenic shock. Will plan to admit to ICU PCCM to take over as primary. Full consult pending     Eliseo Gum MSN, AGACNP-BC [facility] 07/20/2020, 7:56 AM

## 2020-08-03 NOTE — ED Triage Notes (Signed)
Chest pain and shortness of breath while sitting down. Pt has ablation on Monday 07/31/20. Hx of afib. Pt also reports feeling fatigued.

## 2020-08-03 NOTE — ED Notes (Addendum)
Fluids paused BP 147/108, BP reassessed BP 139/50 fluids restarted at 228mL/hr  RT at bedside at this time

## 2020-08-03 NOTE — Procedures (Signed)
Intubation Procedure Note  LAZER WOLLARD  [id number]  1956-03-05  Date:08/05/2020  Time:10:39 AM   Provider Performing:Alanea Woolridge    Procedure: Intubation (31594)  Indication(s) Respiratory Failure  Consent Unable to obtain consent due to emergent nature of procedure.   Anesthesia Etomidate and Succinylcholine   Time Out Verified patient identification, verified procedure, site/side was marked, verified correct patient position, special equipment/implants available, medications/allergies/relevant history reviewed, required imaging and test results available.   Sterile Technique Usual hand hygeine, masks, and gloves were used   Procedure Description Patient positioned in bed supine.  Sedation given as noted above.  Patient was intubated with endotracheal tube using DL.  View was Grade 1 full glottis .  Number of attempts was 1.  Colorimetric CO2 detector was consistent with tracheal placement.   Complications/Tolerance None; patient tolerated the procedure well. Chest X-ray is ordered to verify placement.   EBL Minimal   Specimen(s) None

## 2020-08-03 NOTE — CV Procedure (Signed)
INDICATIONS: Aortic dissection, cardiogenic shock  PROCEDURE:   Informed consent was obtained prior to the procedure from patient's sister. The risks, benefits and alternatives for the procedure were discussed and the sister comprehended these risks.  Risks include, but are not limited to, cough, sore throat, vomiting, nausea, somnolence, esophageal and stomach trauma or perforation, bleeding, low blood pressure, aspiration, pneumonia, infection, trauma to the teeth and death.    After a procedural time-out, TEE probe inserted.   The patient's heart rate, blood pressure, and oxygen saturation were monitored continuously during the procedure. The transesophageal probe was inserted in the esophagus and stomach without difficulty and multiple views were obtained.  TEE performed at the bedside in ICU with the patient intubated on vasopressor support.   Agitated microbubble saline contrast was not administered.  COMPLICATIONS:    There were no immediate complications.  FINDINGS:   Strong suspicion of ascending aortic dissection. There is a thrombosed lumen in the descending aorta which appears to be the false lumen, but imaging is challenging to discern.  RECOMMENDATIONS:     Surgical consultation requested.  Time Spent Directly with the Patient:  60 minutes   Elouise Munroe 08/02/2020, 1:07 PM

## 2020-08-03 NOTE — Progress Notes (Addendum)
Family note  Daughter arrived at bedside along with longtime friend Hilda Blades. Discussed terminal nature of heart and aorta now entering multiorgan failure. Daughter understandably upset, wants [place] to try to keep Misael alive until her aunt can come from [place]. She does not want Dontrae to undergo CPR however should his heart stop.  18 minutes spent in advance care planning.  Erskine Emery MD PCCM

## 2020-08-03 NOTE — ED Notes (Signed)
[facility] emergency contact cell [phone] please call for update

## 2020-08-03 NOTE — ED Notes (Signed)
RT has doppler at bedside, pt has cool extremities with weak pulse

## 2020-08-03 NOTE — Procedures (Signed)
Intraosseous Needle Insertion Procedure Note    Date:07/24/2020  Time:10:40 AM   Provider Performing:Ashanty Coltrane   Procedure: Insertion Intraosseous (22297)  Indication(s) Medication administration  Consent Unable to obtain consent due to emergent nature of procedure.  Anesthesia Topical only with 1% lidocaine   Timeout Verified patient identification, verified procedure, site/side was marked, verified correct patient position, special equipment/implants available, medications/allergies/relevant history reviewed, required imaging and test results available.  Procedure Description Area of needle insertion was cleaned with chlorhexidine. Intraosseous needle was placed into the right tibia. Bone marrow was aspirated and site easily flushed. The needle was secured in place and dressing applied.  Complications/Tolerance None; patient tolerated the procedure well.  EBL Minimal

## 2020-08-03 NOTE — ED Notes (Signed)
Pt continuously calling out of room requesting water and stating " I can't breathe, I need a blanket." This RN has provided pt with water, warm blankets and pillow upon request. Upon entering room pt presents anxious, pt tachypneic and wide eyed with respirations ranging from 20s-30s. Pt responds well to verbal coaching to aid in regulating breathing, respirations decrease with coaching.

## 2020-08-03 NOTE — Progress Notes (Signed)
Echocardiogram completed with Def[facility] 

## 2020-08-03 NOTE — Progress Notes (Signed)
  Echocardiogram 2D Echocardiogram has been performed.  [facility] 08/07/2020, 1:19 PM

## 2020-08-03 NOTE — ED Notes (Signed)
Critical Troponin 454

## 2020-08-03 NOTE — ED Notes (Signed)
Roxanne Mins, MD and [place], [place] made aware of critical troponin

## 2020-08-03 NOTE — ED Notes (Signed)
Cardiology PA Sarajane Jews at bedside

## 2020-08-03 NOTE — Procedures (Signed)
Central Venous Catheter Insertion Procedure Note  [facility]  [id number]  Apr 22, 1955  Date:07/15/2020  Time:10:41 AM   Provider Performing:Makalyn Lennox   Procedure: Insertion of Non-tunneled Central Venous [phone]) with US guidance (72902)   Indication(s) Medication administration  Consent Risks of the procedure as well as the alternatives and risks of each were explained to the patient and/or caregiver.  Consent for the procedure was obtained and is signed in the bedside chart  Anesthesia Topical only with 1% lidocaine   Timeout Verified patient identification, verified procedure, site/side was marked, verified correct patient position, special equipment/implants available, medications/allergies/relevant history reviewed, required imaging and test results available.  Sterile Technique Maximal sterile technique including full sterile barrier drape, hand hygiene, sterile gown, sterile gloves, mask, hair covering, sterile ultrasound probe cover (if used).  Procedure Description Area of catheter insertion was cleaned with chlorhexidine and draped in sterile fashion.  With real-time ultrasound guidance a central venous catheter was placed into the right subclavian vein. Nonpulsatile blood flow and easy flushing noted in all ports.  The catheter was sutured in place and sterile dressing applied.  Complications/Tolerance None; patient tolerated the procedure well. Chest X-ray is ordered to verify placement for internal jugular or subclavian cannulation.   Chest x-ray is not ordered for femoral cannulation.  EBL Minimal  Specimen(s) None

## 2020-08-03 NOTE — Consult Note (Signed)
NAME:  SPARSH CALLENS, MRN:  [id number], DOB:  1955/05/13, LOS: 0 ADMISSION DATE:  07/31/2020, CONSULTATION DATE:  08/11/2020 REFERRING MD:  Sarajane Jews - Cards, CHIEF COMPLAINT:  AMS, impending doom   History of Present Illness:  65 yo M PMH HFrEF (35%), Aflutter (unsuccessful CV), 5.4cm TAA, CKD III who presented to ED 4/21 with progressive SOB and chest pain after unsuccessful CV Monday 4/18. Stable initially in ED 4/21, ECGs without evidence of MI. BNP >2000. Admitted to cards for further management and care.  While in ED had progressive dyspnea and was placed on BiPAP, amio for Aflutter. Received 0.5 ativan, and hours later became  Less responsive, and noted to have cool extremities. ABG without hypercarbia or hypoxia.  PCCM consulted for central line placement and medical management with concern for low output state.  On CCM arrival, pt minimally responsive, RR 40s, abdominal muscle use on BiPAP and required emergent intubation as well as pressors for shock. When intubated, had spontaneous R sided movement but flaccid L side.    Pertinent  Medical History  TAA HFrEF Aflutter  HTN OSA on CPAP  CKD III  Significant Hospital Events: Including procedures, antibiotic start and stop dates in addition to other pertinent events   . 4/21 ED with SOB chest pain. Emergently intubated by CCM. Bedside echo c/f cardiogenic shock -- aorta dilated and c/f possible dissection. Rapidly requiring high dose pressors. Going for STAT CTA chest CT H. Will admit to ICU .   Interim History / Subjective:  CT H with what looks like an old stroke  CTA chest c/f thoracic aortic dissection.  Bedside ECHO with very low appearing EF  Went from normotensive to very shocky now on 50 NE and vaso with SBPs 80s  Is now bradycardic (40s) which is a change from HR 140s an hour ago   Objective   Blood pressure 112/84, pulse (!) 43, temperature 98.9 F (37.2 C), temperature source Oral, resp. rate (!) 27, height 6' (1.829  m), weight 111 kg, SpO2 95 %.    Vent Mode: PRVC FiO2 (%):  [40 %-100 %] 40 % Set Rate:  [20 bmp] 20 bmp Vt Set:  [662 mL] 620 mL PEEP:  [8 cmH20] 8 cmH20   Intake/Output Summary (Last 24 hours) at 07/24/2020 9476 Last data filed at 07/21/2020 5465 Gross per 24 hour  Intake 12.46 ml  Output --  Net 12.46 ml   Filed Weights   07/23/2020 0051  Weight: 111 kg    Examination: General: Chronically and critically ill appearing adult M, intubated lightly sedated NAD  HENT: NCAT ETT secure Lungs: Symmetrical chest expansion, scattered wheeze and crackles. Mechanically ventialted  Cardiovascular: bradycardic s1s2 cap refill sluggish. Central pulses uneven with diminished fem pulses.  Abdomen: Obese, soft ndnt + bowel sounds  Extremities:No acute joint deformity. No edema. Cool extremities  Neuro: Does not follow commands. Pinpoint pupils. Some spontaneous R sided movement, flaccid L side GU: wnl   Labs/imaging that I havepersonally reviewed  (right click and "Reselect all SmartList Selections" daily)   4/21 CT H>>  4/21 CTA chest>> 4/21 ECHO>>   4/21:  LA 8.6  Trop 420  BNP 2066 WBC 11 K 3.1 BUN 49 Cr 2.23 iCal 1.10  Resolved Hospital Problem list     Assessment & Plan:    Acute encephalopathy, unknown etiology -Suspect hypoperfusion related. labs not suggestive of metabolic etiology, not hypoxic or hypercarbic. C/f CVA (when pt did have spontaneous mvmnt, appeared to have  L sided deficit)  P -STAT CT H read pending. Looks like old CVA -RASs goal 0 to -1, minimize sedation as able   Acute respiratory failure, unspecified, requiring intubation -in setting of encephalopathy, shock, poor airway protection.  Hx OSA on CPAP P -ABG in ICU -MV support, wean as able -VAP, pulm hygiene -PAD - RASs gaol 0 to -1   Shock -- hemorrhagic vs cardiogenic vs mixed -POCUS appears to have low LVEF. Other consideration for shock etiology is possible thoracic aortic dissection (known  TAA followed by Orvan Seen)  TAA -- concern for new dissection -CTA chest c/f dissection Acute on chronic HFrEF  -(35% 07/20/2020). POCUS with lower appearing EF 4/21 P -formal ECHO read is pending  -STAT CTA chest  read pending but looks like a thoracic aortic dissection to me. I spoke with CVTS surgeon who has reviewed CTA chest and recs medical mgmnt as no operative intervention (type B dissection, distal to L subclavian)  -Page sent to [facility] for completeness  -Medical mgmgnt for Type B dissection: goal HR < 60, Goal BP <120/80 -Will get TEE 4/21 -currently on Pressors via IO   -CVC, art line when in ICU -[place], likely Adv HF consult  -STAT type and screen    -will trend markers of end organ dysfunction   Lactic Acidosis P -trend. Reflection of severity of above processes   HTN -no antihypertensives in setting of shock  Aflutter RVR -recent unsuccessful CV 4/18 Bradycardia, new  P -dc eliquis w c/f dissection -optimize electrolytes  -Bradycardia actually ok in context of dissection to reduce aortic shear. HR goal < 60 for dissection   AKI on CKD III  -probably r/t shock (dissection v low output CHF) P -foley, trend renal indices     Hypokalemia  Hypocalcemia P -replace continue to trend   [place]  - guarded prognosis and am concerned pt may not survive this hospitalization  -Difficult case -- d/w CVTS and given type/location of dissection there is not an operative intervention at present. Medical management is BP control. Acutely worse HFrEF LVEF now 15% from 35% a few weeks ago. Evidence of end organ dysfunction with AKI on CKD and encephalopathy -is currently Full Code. Family is driving to [place] from out of state.  I spoke with pt sister on phone, as did Dr. Tamala Julian CCM  Best practice (right click and "Reselect all SmartList Selections" daily)  Diet:  NPO Pain/Anxiety/Delirium protocol (if indicated): Yes (RASS goal -1) VAP protocol (if indicated): Yes DVT prophylaxis: SCD GI  prophylaxis: PPI Glucose control:  SSI No Central venous access:  Yes, and it is still needed Arterial line:  Yes, and it is still needed Foley:  Yes, and it is still needed Mobility:  bed rest  PT consulted: N/A Last date of multidisciplinary goals of care discussion [pending. Famnly updated 4/21 by CCM via phone] Code Status:  full code Disposition: ICU  Labs   CBC: Recent Labs  Lab 07/31/20 0810 07/24/2020 0100 08/04/2020 0647 08/10/2020 0653  WBC  --  11.1*  --  8.8  NEUTROABS  --  9.0*  --   --   HGB 13.6 13.6 12.2* 11.6*  HCT 40.0 37.9* 36.0* 33.3*  MCV  --  91.8  --  94.1  PLT  --  238  --  426    Basic Metabolic Panel: Recent Labs  Lab 07/31/20 0810 07/24/2020 0100 08/07/2020 0232 07/14/2020 0647  NA 138 138  --  137  K 3.9 3.1*  --  3.0*  CL 101 102  --   --   CO2  --  26  --   --   GLUCOSE 105* 134*  --   --   BUN 67* 49*  --   --   CREATININE 2.50* 2.23*  --   --   CALCIUM  --  8.8*  --   --   MG  --   --  2.3  --    GFR: Estimated Creatinine Clearance: 41.4 mL/min (A) (by C-G formula based on SCr of 2.23 mg/dL (H)). Recent Labs  Lab 07/24/2020 0100 07/18/2020 0653  WBC 11.1* 8.8    Liver Function Tests: Recent Labs  Lab 08/01/2020 0100  AST 27  ALT 33  ALKPHOS 40  BILITOT 1.9*  PROT 6.5  ALBUMIN 2.8*   No results for input(s): LIPASE, AMYLASE in the last 168 hours. No results for input(s): AMMONIA in the last 168 hours.  ABG    Component Value Date/Time   PHART 7.390 07/25/2020 0647   PCO2ART 30.8 (L) 08/09/2020 0647   PO2ART 537 (H) 07/23/2020 0647   HCO3 18.7 (L) 07/16/2020 0647   TCO2 20 (L) 08/09/2020 0647   ACIDBASEDEF 5.0 (H) 08/07/2020 0647   O2SAT 100.0 08/02/2020 0647     Coagulation Profile: No results for input(s): INR, PROTIME in the last 168 hours.  Cardiac Enzymes: No results for input(s): CKTOTAL, CKMB, CKMBINDEX, TROPONINI in the last 168 hours.  HbA1C: Hemoglobin A1C  Date/Time Value Ref Range Status  05/16/2020 04:50  PM 5.5 4.0 - 5.6 % Final   Hgb A1c MFr Bld  Date/Time Value Ref Range Status  09/29/2019 04:35 AM 5.8 (H) 4.8 - 5.6 % Final    Comment:    (NOTE)         Prediabetes: 5.7 - 6.4         Diabetes: >6.4         Glycemic control for adults with diabetes: <7.0     CBG: No results for input(s): GLUCAP in the last 168 hours.  Review of Systems:   Unable to obtain, obtunded  Past Medical History:  He,  has a past medical history of Chronic kidney disease, Hypertension, Lyme disease, Myocardial infarction ([place]), and Sleep apnea.   Surgical History:   Past Surgical History:  Procedure Laterality Date  . ABLATION    . CARDIOVERSION N/A 07/31/2020   Procedure: CARDIOVERSION;  Surgeon: Lelon Perla, MD;  Location[facility] ENDOSCOPY;  Service: Cardiovascular;  Laterality: N/A;  . RIGHT/LEFT HEART CATH AND CORONARY ANGIOGRAPHY N/A 09/30/2019   Procedure: RIGHT/LEFT HEART CATH AND CORONARY ANGIOGRAPHY;  Surgeon: Jettie Booze, MD;  Location: [facility] CV LAB;  Service: Cardiovascular;  Laterality: N/A;     Social History:   reports that he has never smoked. He has never used smokeless tobacco. He reports previous alcohol use. He reports previous drug use.   Family History:  His family history is not on file.   Allergies Allergies  Allergen Reactions  . Lisinopril Swelling    Angioedema - denies airway complications Tolerates ARBs     Home Medications  Prior to Admission medications   Medication Sig Start Date End Date Taking? Authorizing Provider  acetaminophen (TYLENOL) 325 MG tablet Take 650 mg by mouth every 6 (six) hours as needed for moderate pain.     [provider]  apixaban (ELIQUIS) 5 MG TABS tablet Take 1 tablet (5 mg total) by mouth 2 (two) times daily. 05/16/20  06/15/20  Mullis, Kiersten P, DO  atorvastatin (LIPITOR) 80 MG tablet Take 1 tablet (80 mg total) by mouth daily. Patient not taking: Reported on 07/25/2020 05/16/20   Mina Marble P, DO   empagliflozin (JARDIANCE) 10 MG TABS tablet Take 10 mg by mouth daily.    [provider]  hydrALAZINE (APRESOLINE) 50 MG tablet Take 50 mg by mouth 3 (three) times daily.    [provider]  metoprolol (TOPROL XL) 200 MG 24 hr tablet Take 1 tablet (200 mg total) by mouth daily. 07/20/20   Werner Lean, MD  spironolactone (ALDACTONE) 25 MG tablet Take 1 tablet (25 mg total) by mouth at bedtime. 11/04/19   Leavy Cella, RPH-CPP  torsemide (DEMADEX) 20 MG tablet Take 1 tablet (20 mg total) by mouth as needed (For swelling or weight gain of 3 lbs over night or 5lbs in a week). Patient taking differently: Take 20 mg by mouth daily. 05/24/20   Buford Dresser, MD  traZODone (DESYREL) 50 MG tablet Take 50 mg by mouth at bedtime as needed for sleep.    [provider]  valsartan (DIOVAN) 320 MG tablet Take 320 mg by mouth daily. 05/14/19   [provider]     Critical care time: 104 min     CRITICAL CARE Performed by: Cristal Generous   Total critical care time: 104 minutes  Critical care time was exclusive of separately billable procedures and treating other patients.  Critical care was necessary to treat or prevent imminent or life-threatening deterioration.  Critical care was time spent personally by me on the following activities: development of treatment plan with patient and/or surrogate as well as nursing, discussions with consultants, evaluation of patient's response to treatment, examination of patient, obtaining history from patient or surrogate, ordering and performing treatments and interventions, ordering and review of laboratory studies, ordering and review of radiographic studies, pulse oximetry and re-evaluation of patient's condition.  Eliseo Gum MSN, AGACNP-BC [place] [phone] If no answer, [phone] 08/11/2020, 10:39 AM

## 2020-08-03 NOTE — ED Provider Notes (Addendum)
[facility] EMERGENCY DEPARTMENT Provider Note   CSN: [id number] Arrival date & time: 07/14/2020  0034   History Chief Complaint  Patient presents with  . Shortness of Breath  . Chest Pain    Logan Robertson is a 65 y.o. male.  The history is provided by the patient.  Shortness of Breath Associated symptoms: chest pain   Chest Pain Associated symptoms: shortness of breath   He has history of hypertension, hyperlipidemia, coronary artery disease, atrial flutter anticoagulated on apixaban, chronic kidney disease, systolic heart failure and comes in because of worsening dyspnea.  He had cardioversion done 2 days ago to treat atrial flutter, but conversion was not successful.  He noted worsening of his chronic dyspnea today.  He has generalized weakness and dizziness which are unchanged from baseline.  He did have an episode of chest tightness with associated diaphoresis yesterday, but none today.  Past Medical History:  Diagnosis Date  . Chronic kidney disease   . Hypertension   . Lyme disease   . Myocardial infarction ([place])   . Sleep apnea     Patient Active Problem List   Diagnosis Date Noted  . Aortic atherosclerosis ([place]) 07/27/2020  . Weight loss 05/25/2020  . Chronic fatigue 05/25/2020  . History of CVA (cerebrovascular accident) 05/24/2020  . OSA (obstructive sleep apnea) 05/16/2020  . Insomnia 05/16/2020  . DOE (dyspnea on exertion) 05/16/2020  . Angioedema due to angiotensin converting enzyme inhibitor (ACE-I) 12/13/2019  . Stroke ([place]) 12/13/2019  . Erectile dysfunction 11/29/2019  . Heart failure with reduced ejection fraction ([place]) 10/06/2019  . Peripheral arterial disease ([place]) 10/06/2019  . NICM (nonischemic cardiomyopathy) ([place]) 10/06/2019  . Essential hypertension 10/06/2019  . CKD (chronic kidney disease) stage 3, GFR 30-59 ml/min (HCC) 10/06/2019  . Paroxysmal atrial flutter ([place]) 10/06/2019  . Ascending aorta dilatation (HCC)  10/06/2019  . Hyperlipidemia 10/06/2019  . Atrial flutter ([place]) 09/28/2019  . Stress incontinence of urine 05/25/2019  . Prostate cancer ([place]) 05/23/2019  . Obesity 03/18/2019  . Anemia 08/12/2018    Past Surgical History:  Procedure Laterality Date  . ABLATION    . CARDIOVERSION N/A 07/31/2020   Procedure: CARDIOVERSION;  Surgeon: Lelon Perla, MD;  Location: [facility] ENDOSCOPY;  Service: Cardiovascular;  Laterality: N/A;  . RIGHT/LEFT HEART CATH AND CORONARY ANGIOGRAPHY N/A 09/30/2019   Procedure: RIGHT/LEFT HEART CATH AND CORONARY ANGIOGRAPHY;  Surgeon: Jettie Booze, MD;  Location: [facility] CV LAB;  Service: Cardiovascular;  Laterality: N/A;       No family history on file.  Social History   Tobacco Use  . Smoking status: Never Smoker  . Smokeless tobacco: Never Used  Substance Use Topics  . Alcohol use: Not Currently  . Drug use: Not Currently    Home Medications Prior to Admission medications   Medication Sig Start Date End Date Taking? Authorizing Provider  acetaminophen (TYLENOL) 325 MG tablet Take 650 mg by mouth every 6 (six) hours as needed for moderate pain.     [provider]  apixaban (ELIQUIS) 5 MG TABS tablet Take 1 tablet (5 mg total) by mouth 2 (two) times daily. 05/16/20 06/15/20  Mullis, Kiersten P, DO  atorvastatin (LIPITOR) 80 MG tablet Take 1 tablet (80 mg total) by mouth daily. Patient not taking: Reported on 07/25/2020 05/16/20   Mina Marble P, DO  empagliflozin (JARDIANCE) 10 MG TABS tablet Take 10 mg by mouth daily.    [provider]  hydrALAZINE (APRESOLINE) 50 MG  tablet Take 50 mg by mouth 3 (three) times daily.    [provider]  metoprolol (TOPROL XL) 200 MG 24 hr tablet Take 1 tablet (200 mg total) by mouth daily. 07/20/20   Werner Lean, MD  spironolactone (ALDACTONE) 25 MG tablet Take 1 tablet (25 mg total) by mouth at bedtime. 11/04/19   Leavy Cella, RPH-CPP  torsemide (DEMADEX) 20 MG tablet  Take 1 tablet (20 mg total) by mouth as needed (For swelling or weight gain of 3 lbs over night or 5lbs in a week). Patient taking differently: Take 20 mg by mouth daily. 05/24/20   Buford Dresser, MD  traZODone (DESYREL) 50 MG tablet Take 50 mg by mouth at bedtime as needed for sleep.    [provider]  valsartan (DIOVAN) 320 MG tablet Take 320 mg by mouth daily. 05/14/19   [provider]    Allergies    Lisinopril  Review of Systems   Review of Systems  Respiratory: Positive for shortness of breath.   Cardiovascular: Positive for chest pain.  All other systems reviewed and are negative.   Physical Exam Updated Vital Signs BP (!) 132/101   Pulse (!) 53   Temp 98.9 F (37.2 C) (Oral)   Resp 16   Ht 6' (1.829 m)   Wt 111 kg   SpO2 98%   BMI 33.19 kg/m   Physical Exam Vitals and nursing note reviewed.   65 year old male, appears dyspneic at rest. Vital signs are significant for rapid heart rate and elevated blood pressure. Oxygen saturation is 98%, which is normal. Head is normocephalic and atraumatic. PERRLA, EOMI. Oropharynx is clear. Neck is nontender and supple without adenopathy or JVD. Back is nontender and there is no CVA tenderness. Lungs are clear without rales, wheezes, or rhonchi. Chest is nontender. Heart is tachycardic without murmur. Abdomen is soft, flat, nontender without masses or hepatosplenomegaly and peristalsis is normoactive. Extremities have no cyanosis or edema, full range of motion is present. Skin is warm and dry without rash. Neurologic: Mental status is normal, cranial nerves are intact, moves all extremities equally.  ED Results / Procedures / Treatments   Labs (all labs ordered are listed, but only abnormal results are displayed) Labs Reviewed  COMPREHENSIVE METABOLIC PANEL - Abnormal; Notable for the following components:      Result Value   Potassium 3.1 (*)    Glucose, Bld 134 (*)    BUN 49 (*)    Creatinine,  Ser 2.23 (*)    Calcium 8.8 (*)    Albumin 2.8 (*)    Total Bilirubin 1.9 (*)    GFR, Estimated 32 (*)    All other components within normal limits  CBC WITH DIFFERENTIAL/PLATELET - Abnormal; Notable for the following components:   WBC 11.1 (*)    RBC 4.13 (*)    HCT 37.9 (*)    RDW 16.0 (*)    Neutro Abs 9.0 (*)    Abs Immature Granulocytes 0.08 (*)    All other components within normal limits  BRAIN NATRIURETIC PEPTIDE - Abnormal; Notable for the following components:   B Natriuretic Peptide 2,066.0 (*)    All other components within normal limits  TROPONIN I (HIGH SENSITIVITY) - Abnormal; Notable for the following components:   Troponin I (High Sensitivity) 454 (*)    All other components within normal limits    EKG EKG Interpretation  Date/Time:  Thursday August 03 2020 00:44:38 EDT Ventricular Rate:  123  PR Interval:    QRS Duration: 104 QT Interval:  338 QTC Calculation: 483 R Axis:   -13 Text Interpretation: Atrial flutter with variable A-V block with premature ventricular or aberrantly conducted complexes Left ventricular hypertrophy with repolarization abnormality ( R in aVL ) Inferior infarct , age undetermined Abnormal ECG When compared with ECG of 05/16/2020, Atrial flutter has changed Sinus rhythm QT has shortened Confirmed by Delora Fuel ([id number]) on 08/10/2020 2:31:00 AM   Radiology DG Chest 2 View  Result Date: 08/11/2020 CLINICAL DATA:  Chest pain and shortness of breath EXAM: CHEST - 2 VIEW COMPARISON:  12/05/2019 FINDINGS: Cardiomegaly. Normal pleural spaces. No focal airspace consolidation or pulmonary edema. IMPRESSION: Cardiomegaly without focal airspace disease. Electronically Signed   By: Ulyses Jarred M.D.   On: 08/02/2020 01:19    Procedures Procedures  CRITICAL CARE Performed by: Delora Fuel Total critical care time: 45 minutes Critical care time was exclusive of separately billable procedures and treating other patients. Critical care was  necessary to treat or prevent imminent or life-threatening deterioration. Critical care was time spent personally by me on the following activities: development of treatment plan with patient and/or surrogate as well as nursing, discussions with consultants, evaluation of patient's response to treatment, examination of patient, obtaining history from patient or surrogate, ordering and performing treatments and interventions, ordering and review of laboratory studies, ordering and review of radiographic studies, pulse oximetry and re-evaluation of patient's condition.  Medications Ordered in ED Medications  aspirin chewable tablet 324 mg (has no administration in time range)  furosemide (LASIX) injection 40 mg (has no administration in time range)  potassium chloride SA (KLOR-CON) CR tablet 40 mEq (has no administration in time range)    ED Course  I have reviewed the triage vital signs and the nursing notes.  Pertinent labs & imaging results that were available during my care of the patient were reviewed by me and considered in my medical decision making (see chart for details).  MDM Rules/Calculators/A&P Worsening of chronic dyspnea and patient with known history of heart failure and atrial flutter.  ECG shows atrial flutter which had not been present on ECG from 05/16/2020.  Chest x-ray shows cardiomegaly but no significant pulmonary vascular congestion.  Labs show elevated troponin and markedly elevated BNP.  Some of troponin elevation may be related to recent cardioversion and some may be secondary to demand ischemia.  He is noted to be hypokalemic and is given oral potassium and will check magnesium level.  Bilirubin is noted to be elevated, which is new, cause uncertain.  Renal insufficiency is slightly improved compared with 4/18, but generally worse compared with earlier in the year.  He is given a dose of furosemide.  He is already anticoagulated on apixaban, so cannot be started on heparin.   He is given oral aspirin.  He is already on high-dose beta-blockers, will need to consider calcium channel blockers for rate control.  Case is discussed with Dr. Paticia Stack of cardiology service who agrees to evaluate the patient for admission.  Final Clinical Impression(s) / ED Diagnoses Final diagnoses:  Acute on chronic systolic heart failure (HCC)  Non-STEMI (non-ST elevated myocardial infarction) (HCC)  Hypokalemia  Renal insufficiency  Atrial flutter with rapid ventricular response [facility])    Rx / DC Orders ED Discharge Orders    None       Delora Fuel, MD 85/88/50 0237  6:20 AM Patient has become significantly more dyspneic, displaying air hunger.  He had received low-dose  lorazepam without any benefit.  Will place on BiPAP.  We will also check ABG.  7:28 AM He is much more comfortable on BiPAP and maintaining excellent oxygen saturations.  CRITICAL CARE Performed by: Delora Fuel Total critical care time: 40 minutes Critical care time was exclusive of separately billable procedures and treating other patients. Critical care was necessary to treat or prevent imminent or life-threatening deterioration. Critical care was time spent personally by me on the following activities: development of treatment plan with patient and/or surrogate as well as nursing, discussions with consultants, evaluation of patient's response to treatment, examination of patient, obtaining history from patient or surrogate, ordering and performing treatments and interventions, ordering and review of laboratory studies, ordering and review of radiographic studies, pulse oximetry and re-evaluation of patient's condition.   Delora Fuel, MD 09/40/76 [phone]

## 2020-08-03 NOTE — ED Notes (Signed)
This RN spoke with Roxanne Mins, MD regarding pt verbalizing having panic attacks and feeling high anxiety. Pt reports at home pt has anxiety attacks when feeling like he cannot breathe. SPO2 100% on room air, pt respirations fast and labored at this time but can be easily managed with coaching pt on respirations.

## 2020-08-03 NOTE — ED Notes (Addendum)
Pt tachypneic 40s-60s, BP decreased 64/51, this RN started LR bolus, Logan Chen, RN at bedside notified Roxanne Mins, MD who assessed pt. RT called to place pt on BiPAP, BP improved with fluids 107/72. Pt eyes rolling back in bed with intermittently, pt responsive to tactile and verbal stimuli, able to follow commands.

## 2020-08-03 NOTE — ED Notes (Signed)
Pt appears SOB, this RN aided pt up to sit at bedside with feet on floor in tripod position with audible wheezing 77% RA, pt placed on 4L O2 via [place]

## 2020-08-03 NOTE — Plan of Care (Signed)

## 2020-08-03 NOTE — Procedures (Signed)
Arterial Catheter Insertion Procedure Note  Logan Robertson  [id number]  Aug 25, 1955  Date:07/25/2020  Time:10:40 AM    Provider Performing: Jacky Kindle    Procedure: Insertion of Arterial Line [id number]) with US guidance ([id number])   Indication(s) Blood pressure monitoring and/or need for frequent ABGs  Consent Risks of the procedure as well as the alternatives and risks of each were explained to the patient and/or caregiver.  Consent for the procedure was obtained and is signed in the bedside chart  Anesthesia None   Time Out Verified patient identification, verified procedure, site/side was marked, verified correct patient position, special equipment/implants available, medications/allergies/relevant history reviewed, required imaging and test results available.   Sterile Technique Maximal sterile technique including full sterile barrier drape, hand hygiene, sterile gown, sterile gloves, mask, hair covering, sterile ultrasound probe cover (if used).   Procedure Description Area of catheter insertion was cleaned with chlorhexidine and draped in sterile fashion. With real-time ultrasound guidance an arterial catheter was placed into the right Axillary artery.  Appropriate arterial tracings confirmed on monitor.     Complications/Tolerance None; patient tolerated the procedure well.   EBL Minimal   Specimen(s) None

## 2020-08-03 NOTE — Progress Notes (Addendum)
Progress Note  Patient Name: Logan Robertson Date of Encounter: 08/01/2020  [facility] Cardiologist: Buford Dresser, MD   Subjective   Patient admitted overnight for atrial flutter with RVR (failed DCCV earlier this week) and acute on chronic CHF after presenting with shortness of breath, weakness, fatigue, and lightheadedness. Since admission, respiratory status declined and he had to be placed on BIPAP. Also became hypotensive with systolic BP as low as the 60's to 70's (improved with IV fluids). Per RN, he had an "impending doom" look about him prior to BiPAP.  When I initially went to see patient he was resting comfortably on BiPAP. O2 sats 100%. However, he is basically unresponsive for me - he will briefly open his eyes but will not response to commands. Cool to the touch. Concerned for low output CHF. PCCM was consulted to place central line. When I returned to check on patient, he was intubated.   Inpatient Medications    Scheduled Meds:  Continuous Infusions: . amiodarone 60 mg/hr (07/23/2020 0409)  . amiodarone     PRN Meds:    Vital Signs    Vitals:   08/10/2020 0602 08/02/2020 0610 07/18/2020 0615 07/27/2020 0635  BP: (!) 75/56 107/72 (!) 147/108 (!) 72/52  Pulse:  (!) 45 (!) 124 (!) 121  Resp: (!) 25 (!) 44 (!) 26 (!) 27  Temp:      TempSrc:      SpO2:  100% 100% 100%  Weight:      Height:       No intake or output data in the 24 hours ending 07/18/2020 0640 Last 3 Weights 08/11/2020 07/31/2020 07/13/2020  Weight (lbs) 244 lb 11.4 oz 245 lb 243 lb  Weight (kg) 111 kg 111.131 kg 110.224 kg      Telemetry    Atrial flutter rate currently in the 80's to low 100's on Amiodarone drip. - Personally Reviewed  ECG    Atrial flutter, rate 123 bpm, with possible Q waves in leads III and V1 as we;; as some ST/depression in lead II and V4-V6.  - Personally Reviewed  Physical Exam   GEN: Intubated and unresponsive.  Neck: JVD difficult to assess due to body  habitus. Cardiac: Irregular rhythm with normal rate. No significant murmurs, rubs, or gallops. Pulses weak. Respiratory: On BiPAP. Mild crackles noted in bases. GI: Soft, non-distended, and non-tender.  MS: No lower extremity edema. No deformity. Skin: Extremities cools.  Neuro:  Unresponsive. Briefly opened eye for me but would not respond to commands. Psych: Unresponsive.   Labs    High Sensitivity Troponin:   Recent Labs  Lab 08/07/2020 0100 08/07/2020 0232  TROPONINIHS 454* 420*      Chemistry Recent Labs  Lab 07/31/20 0810 08/02/2020 0100  NA 138 138  K 3.9 3.1*  CL 101 102  CO2  --  26  GLUCOSE 105* 134*  BUN 67* 49*  CREATININE 2.50* 2.23*  CALCIUM  --  8.8*  PROT  --  6.5  ALBUMIN  --  2.8*  AST  --  27  ALT  --  33  ALKPHOS  --  40  BILITOT  --  1.9*  GFRNONAA  --  32*  ANIONGAP  --  10     Hematology Recent Labs  Lab 07/31/20 0810 07/26/2020 0100  WBC  --  11.1*  RBC  --  4.13*  HGB 13.6 13.6  HCT 40.0 37.9*  MCV  --  91.8  MCH  --  32.9  MCHC  --  35.9  RDW  --  16.0*  PLT  --  238    BNP Recent Labs  Lab 08/10/2020 0100  BNP 2,066.0*     DDimer No results for input(s): DDIMER in the last 168 hours.   Radiology    DG Chest 2 View  Result Date: 08/05/2020 CLINICAL DATA:  Chest pain and shortness of breath EXAM: CHEST - 2 VIEW COMPARISON:  12/05/2019 FINDINGS: Cardiomegaly. Normal pleural spaces. No focal airspace consolidation or pulmonary edema. IMPRESSION: Cardiomegaly without focal airspace disease. Electronically Signed   By: Ulyses Jarred M.D.   On: 07/28/2020 01:19    Cardiac Studies   Echocardiogram 09/29/2019: Impressions: 1. Left ventricular ejection fraction, by estimation, is 35 to 40%. The  left ventricle has moderately decreased function. The left ventricle  demonstrates global hypokinesis. There is mild concentric left ventricular  hypertrophy. Left ventricular  diastolic function could not be evaluated.  2. Right  ventricular systolic function is normal. The right ventricular  size is normal. Tricuspid regurgitation signal is inadequate for assessing  PA pressure.  3. The mitral valve is normal in structure. Moderate mitral annular  calcification of the posterior mitral valve annulus .No evidence of mitral  valve regurgitation. No evidence of mitral stenosis.  4. The aortic valve is tricuspid. Aortic valve regurgitation is moderate.  Mild aortic valve sclerosis is present, with no evidence of aortic valve  stenosis. Aortic regurgitation PHT measures 329 msec.  5. Aortic dilatation noted. There is mild dilatation at the level of the  sinuses of Valsalva and severe dilatation of the ascending aorta measuring  43 mm and 14mm respectively.  6. The inferior vena cava is normal in size with greater than 50%  respiratory variability, suggesting right atrial pressure of 3 mmHg.  7. Left atrial size was severely dilated. _______________  Right/Left Cardiac Catheterization 2/68/3419:  LV end diastolic pressure is normal.  There is no aortic valve stenosis.  Hemodynamic findings consistent with mild pulmonary hypertension.  Ao sat 97%, PA sat 69%, PA pressure 35/20, mean PA 26 mm Hg, mean PCWP 16 mm Hg; CO 6.9 L/min; CI 2.82  No angiographically apparent coronary artery disease.  Due to severe right subclavian tortuosity, we switched to the femoral approach. Would not attempt right radial approach in the future.   Continue medical therapy.  Patient with intermittent atrial flutter during the case.  He would convert back to NSR spontaneously, and once with IV metoprolol   Patient Profile     65 y.o. male with a history of normal coronaries on 09/30/2019, non-ischemic (likely tachymediated) cardiomyopathy with chronic systolic CHF and EF of 62-22% on Echo in 09/2019, persistent atrial flutter s/p failed DCCV on 07/31/2020 on Eliquis, 5.4 ascending aortic aneurysm followed by Dr. Orvan Seen, sleep apnea,  prior CVA, hypertension, hyperlipidemia, and CKD stage III who was admitted with atrial flutter with RVR and acute on chronic CHF.   Assessment & Plan    Acute Hypoxic Respiratory Failure - Patient respiratory status has declined since admission earlier this morning. Initially on BiPAP and then required intubation. - Chest x-ray showed cardiomegaly but no overt edema or opacities. - ABG showed pH 7.39, pCO2 30.8, p O2 537, BiCarb 18.7.  - He is cool to the touch. Concerned about low output CHF. STAT Echo ordered. PCCM consulted to help place central line and admit to ICU. Will check COOX. Suspect he will need pressors. Lactic acid pending.   Non-Ischemic Cardiomyopathy Acute  on Chronic Systolic CHF - BNP 7,517.  - No overt edema on chest x-ray.  - Echo in 09/2019 showed LVEF of 35-40% with global hypokinesis with severe dilatation of ascending aorta measuring 52 mm and moderate AI. - Received one dose of IV Lasix 40mg  in the ED. However, then became hypotensive with systolic BP in the 00'F to 70's.  - Cool to the touch. Concerned for low output CHF. - PCCM is placing a central line currently and has started Levophed. Will wait on COOX and can adjust pressors and Lasix accordingly. - Home Toprol on hold due to low output. - Home Hydralazine and Spironolactone on hold due to hypotension. - History of angiodema on Lisinopril so would not try ACEi/ARB/ARNI if renal function improves.  Persistent Atrial Flutter - S/p failed DCCV on 4/18. - Continue IV Amiodarone for now. Rates controlled. - No beta-blocker due to low output. - Currently has Eliquis ordered for anticoagulation. May need to switch to IV Heparin now that patient is intubated; however, will wait for Echo to make sure patient does not have aortic dissection.  Demand Ischemia - High-sensitivity troponin flat in the 400's. - Normal coronaries on cardiac cath in 09/2019. - Suspect demand ischemia in setting of low output  CHF.  Ascending Thoracic Aneurysm - Chest CTA in 07/24/2020 showed greatest diameter 5.4 cm. Followed by Dr. Marland Kitchen Will hold off on repeat CTA given AKI. Will evaluate for dissection on Echo.  Hypotensive - Systolic BP as low as the 60's to 70's. - Home medications held and PCCM starting Levophed. - Management per PCCM.  Hyperlipidemia - Restart Lipitor when able to take PO (currently intubated).  Acute on CKD Stage III - Creatinine 2.23 on admission. 2.50 on 4/18. However, baseline around 1.3-1.4. - Suspect secondary to low output CHF. - Continue to monitor closely.  Hypokalemia - Potassium 3.1 on admission. Repleted. - Continue to monitor.  For questions or updates, please contact [place] Please consult [email or web address] for contact info under        Signed, Darreld Mclean, PA-C  08/10/2020, 6:40 AM    I was called urgently to the bedside by Sande Rives, PA-C for reduced level of responsiveness, increased work of breathing, and apparent cardiogenic shock.  Patient is cool on exam.  We discussed contacting critical care urgently.  When I arrived in the room, patient had been emergently intubated.  He was started on norepinephrine and titrated due to severe hypotension.  He is in recurrent atrial flutter.  He presented on Monday for cardioversion he had brief restoration of sinus rhythm during the procedure but developed recurrent atrial flutter.  Unsuccessful cardioversion ultimately, and patient dismissed home with plan for EP evaluation.  Patient presented today with shortness of breath and chest pain, generalized weakness and dizziness, diaphoresis the day prior with chest tightness.  He has a known ascending aorta aneurysm measuring up to 5.4 cm.  No description of acute severe chest pain, however cannot exclude a dissection with the patient's decompensated state.  I performed a bedside echocardiogram in the presence of critical care.  LVEF is at most 15%, RV is severely  dysfunctional with no significant dilation.  At least moderate MR and TR, will evaluate this further on formal echocardiogram which is planned next.  Ascending aorta is noted to be dilated from high left parasternal window, no obvious dissection flap seen on limited bedside images.  Stat head CT and stat CT angio chest abdomen pelvis for dissection to be  performed.  Critical care has graciously agreed to assume primary management, cardiology is on board.  CRITICAL CARE Performed by: Cherlynn Kaiser, MD   Total critical care time: 45 minutes   Critical care time was exclusive of separately billable procedures and treating other patients.   Critical care was necessary to treat or prevent imminent or life-threatening deterioration.   Critical care was time spent personally by me (independent of APPs or residents) on the following activities: development of treatment plan with patient and/or surrogate as well as nursing, discussions with consultants, evaluation of patient's response to treatment, examination of patient, obtaining history from patient or surrogate, ordering and performing treatments and interventions, ordering and review of laboratory studies, ordering and review of radiographic studies, pulse oximetry and re-evaluation of patient's condition.

## 2020-08-04 ENCOUNTER — Inpatient Hospital Stay (HOSPITAL_COMMUNITY): Payer: Medicare HMO

## 2020-08-04 ENCOUNTER — Other Ambulatory Visit (HOSPITAL_COMMUNITY): Payer: Medicare HMO

## 2020-08-04 DIAGNOSIS — I4892 Unspecified atrial flutter: Secondary | ICD-10-CM | POA: Diagnosis not present

## 2020-08-04 DIAGNOSIS — I5043 Acute on chronic combined systolic (congestive) and diastolic (congestive) heart failure: Secondary | ICD-10-CM | POA: Diagnosis not present

## 2020-08-04 LAB — COMPREHENSIVE METABOLIC PANEL
ALT: 1234 U/L — ABNORMAL HIGH (ref 0–44)
AST: 1859 U/L — ABNORMAL HIGH (ref 15–41)
Albumin: 2.6 g/dL — ABNORMAL LOW (ref 3.5–5.0)
Alkaline Phosphatase: 40 U/L (ref 38–126)
Anion gap: 14 (ref 5–15)
BUN: 58 mg/dL — ABNORMAL HIGH (ref 8–23)
CO2: 20 mmol/L — ABNORMAL LOW (ref 22–32)
Calcium: 8.7 mg/dL — ABNORMAL LOW (ref 8.9–10.3)
Chloride: 104 mmol/L (ref 98–111)
Creatinine, Ser: 3.11 mg/dL — ABNORMAL HIGH (ref 0.61–1.24)
GFR, Estimated: 21 mL/min — ABNORMAL LOW (ref >60)
Glucose, Bld: 89 mg/dL (ref 70–99)
Potassium: 3.3 mmol/L — ABNORMAL LOW (ref 3.5–5.1)
Sodium: 138 mmol/L (ref 135–145)
Total Bilirubin: 1.7 mg/dL — ABNORMAL HIGH (ref 0.3–1.2)
Total Protein: 6.1 g/dL — ABNORMAL LOW (ref 6.5–8.1)

## 2020-08-04 LAB — CBC
HCT: 36.4 % — ABNORMAL LOW (ref 39.0–52.0)
Hemoglobin: 13.2 g/dL (ref 13.0–17.0)
MCH: 32.6 pg (ref 26.0–34.0)
MCHC: 36.3 g/dL — ABNORMAL HIGH (ref 30.0–36.0)
MCV: 89.9 fL (ref 80.0–100.0)
Platelets: 221 10*3/uL (ref 150–400)
RBC: 4.05 MIL/uL — ABNORMAL LOW (ref 4.22–5.81)
RDW: 15.8 % — ABNORMAL HIGH (ref 11.5–15.5)
WBC: 9.7 10*3/uL (ref 4.0–10.5)
nRBC: 0.2 % (ref 0.0–0.2)

## 2020-08-04 LAB — COOXEMETRY PANEL
Carboxyhemoglobin: 1 % (ref 0.5–1.5)
Methemoglobin: 0.9 % (ref 0.0–1.5)
O2 Saturation: 57.3 %
Total hemoglobin: 10.6 g/dL — ABNORMAL LOW (ref 12.0–16.0)

## 2020-08-04 LAB — LACTATE DEHYDROGENASE: LDH: 1760 U/L — ABNORMAL HIGH (ref 98–192)

## 2020-08-04 LAB — BRAIN NATRIURETIC PEPTIDE: B Natriuretic Peptide: 1272.1 pg/mL — ABNORMAL HIGH (ref 0.0–100.0)

## 2020-08-04 LAB — LACTIC ACID, PLASMA
Lactic Acid, Venous: 1.1 mmol/L (ref 0.5–1.9)
Lactic Acid, Venous: 1.3 mmol/L (ref 0.5–1.9)

## 2020-08-04 MED ORDER — SODIUM CHLORIDE 0.9 % IV SOLN
2.0000 g | INTRAVENOUS | Status: DC
  Administered 2020-08-04 – 2020-08-05 (×2): 2 g via INTRAVENOUS
  Filled 2020-08-04 (×2): qty 2

## 2020-08-04 MED ORDER — IOHEXOL 350 MG/ML SOLN
100.0000 mL | Freq: Once | INTRAVENOUS | Status: AC | PRN
  Administered 2020-08-04: 100 mL via INTRAVENOUS

## 2020-08-04 MED ORDER — MIDAZOLAM HCL 2 MG/2ML IJ SOLN
INTRAMUSCULAR | Status: AC
  Administered 2020-08-04: 2 mg via INTRAVENOUS
  Filled 2020-08-04: qty 2

## 2020-08-04 MED ORDER — VANCOMYCIN HCL 1500 MG/300ML IV SOLN: 1500.0000 mg | INTRAVENOUS | Status: DC

## 2020-08-04 MED ORDER — MIDAZOLAM HCL 2 MG/2ML IJ SOLN: 2.0000 mg | Freq: Once | INTRAMUSCULAR | Status: AC

## 2020-08-04 MED ORDER — PROPOFOL 1000 MG/100ML IV EMUL
0.0000 ug/kg/min | INTRAVENOUS | Status: DC
  Administered 2020-08-05: 10 ug/kg/min via INTRAVENOUS
  Filled 2020-08-04: qty 100

## 2020-08-04 MED ORDER — PROPOFOL 1000 MG/100ML IV EMUL
INTRAVENOUS | Status: AC
  Administered 2020-08-04: 5 ug/kg/min via INTRAVENOUS
  Filled 2020-08-04: qty 100

## 2020-08-04 MED ORDER — POTASSIUM CHLORIDE 20 MEQ PO PACK
40.0000 meq | PACK | Freq: Once | ORAL | Status: AC
  Administered 2020-08-04: 40 meq
  Filled 2020-08-04: qty 2

## 2020-08-04 MED ORDER — ROCURONIUM BROMIDE 10 MG/ML (PF) SYRINGE
PREFILLED_SYRINGE | INTRAVENOUS | Status: AC
  Administered 2020-08-04: 80 mg via INTRAVENOUS
  Filled 2020-08-04: qty 10

## 2020-08-04 MED ORDER — ROCURONIUM BROMIDE 10 MG/ML (PF) SYRINGE: 80.0000 mg | PREFILLED_SYRINGE | Freq: Once | INTRAVENOUS | Status: AC

## 2020-08-04 MED ORDER — VANCOMYCIN HCL 10 G IV SOLR
2500.0000 mg | Freq: Once | INTRAVENOUS | Status: AC
  Administered 2020-08-04: 2500 mg via INTRAVENOUS
  Filled 2020-08-04: qty 2500

## 2020-08-04 NOTE — Progress Notes (Addendum)
NAME:  Logan Robertson, MRN:  [id number], DOB:  05-Apr-1956, LOS: 1 ADMISSION DATE:  02-Sep-2020, CONSULTATION DATE:  09/02/20 REFERRING MD:  Sarajane Jews - Cards, CHIEF COMPLAINT:  AMS, impending doom   History of Present Illness:  65 yo M PMH HFrEF (35%), Aflutter (unsuccessful CV), 5.4cm TAA, CKD III who presented to ED 4/21 with progressive SOB and chest pain after unsuccessful CV Monday 4/18. Stable initially in ED 4/21, ECGs without evidence of MI. BNP >2000. Admitted to cards for further management and care.  While in ED had progressive dyspnea and was placed on BiPAP, amio for Aflutter. Received 0.5 ativan, and hours later became  Less responsive, and noted to have cool extremities. ABG without hypercarbia or hypoxia.  PCCM consulted for central line placement and medical management with concern for low output state.  On CCM arrival, pt minimally responsive, RR 40s, abdominal muscle use on BiPAP and required emergent intubation as well as pressors for shock. When intubated, had spontaneous R sided movement but flaccid L side.    Pertinent  Medical History  TAA HFrEF Aflutter  HTN OSA on CPAP  CKD III  Significant Hospital Events: Including procedures, antibiotic start and stop dates in addition to other pertinent events   . 4/21 ED with SOB chest pain. Emergently intubated by CCM. Bedside echo c/f cardiogenic shock -- aorta dilated and c/f possible dissection. Rapidly requiring high dose pressors. Going for STAT CTA chest CT H. Will admit to ICU  Interim History / Subjective:  Liver and kidney panel stabilized over  No acute issues overnight   Objective   Blood pressure 128/68, pulse (!) 106, temperature (!) 100.76 F (38.2 C), resp. rate 20, height 6' (1.829 m), weight 108.1 kg, SpO2 100 %.    Vent Mode: PRVC FiO2 (%):  [40 %] 40 % Set Rate:  [20 bmp] 20 bmp Vt Set:  [580 mL] 580 mL PEEP:  [8 cmH20] 8 cmH20 Plateau Pressure:  [18 cmH20-19 cmH20] 18 cmH20   Intake/Output Summary  (Last 24 hours) at 08/04/2020 0919 Last data filed at 08/04/2020 0700 Gross per 24 hour  Intake 1386.35 ml  Output 825 ml  Net 561.35 ml   Filed Weights   2020-09-02 0051 08/04/20 0451  Weight: 111 kg 108.1 kg    Examination: General: Acutely ill-appearing middle-age male lying in bed on mechanical ventilation in no acute distress HEENT: ETT, MM pink/moist, PERRL,  Neuro: Unresponsive CV: s1s2 regular rate and rhythm, no murmur, rubs, or gallops,  PULM: Clear to auscultation bilaterally, no increased work of breathing GI: soft, bowel sounds active in all 4 quadrants, non-tender, non-distended Extremities: warm/dry, no edema  Skin: no rashes or lesions  Labs/imaging that I havepersonally reviewed    4/21 CT H>> no acute findings chronic infarct right occipital parietal lobe  4/21 CTA chest>> concern for aortic dissection but limited exam see full report for further details  4/21 ECHO>> EF 15% with severely decreased LV function and global hypokinesis  4/21: LA 8.6 , Trop 420 , BNP 2066, WBC 11, K 3.1 BUN 49 Cr 2.23 iCal 1.10  4/22: Potassium 3.3, creatinine 3.11, severely elevated LFTs but downtrending from day prior, BNP also decreased 1 day prior  Resolved Hospital Problem list     Assessment & Plan:  Acute respiratory failure, unspecified, requiring intubation -in setting of encephalopathy, shock, poor airway protection.  Hx OSA on CPAP P: Continue ventilator support with lung protective strategies  Wean PEEP and FiO2 for sats greater  than 90%. Head of bed elevated 30 degrees. Plateau pressures less than 30 cm H20.  Follow intermittent chest x-ray and ABG.   SAT/SBT as tolerated, mentation preclude extubation  Ensure adequate pulmonary hygiene  Follow cultures  VAP bundle in place  PAD protocol  Acute encephalopathy, unknown etiology -Suspect hypoperfusion related. labs not suggestive of metabolic etiology, not hypoxic or hypercarbic. C/f CVA (when pt did have  spontaneous mvmnt, appeared to have L sided deficit)  P: Management per neurology  Maintain neuro protective measures; goal for eurothermia, euglycemia, eunatermia, normoxia, and PCO2 goal of 35-40 Nutrition and bowel regiment  Seizure precautions  Aspirations precautions   Cardiogenic vs septic shock  -POCUS appears to have low LVEF. Other consideration for shock etiology is possible thoracic aortic dissection (known TAA followed by Orvan Seen)  TAA -- concern for new dissection -CTA chest c/f dissection Acute on chronic HFrEF  -(35% 07/20/2020). POCUS with lower appearing EF 4/21 P: Echo as above Repeat CTA chest today Heart failure and cardiothoracic surgery following, appreciate assistance Strict intake and output Daily weight Continue dobutamine Now febrile, will discuss need to ABT with attending   Lactic Acidosis, improving P Supportive care  HTN P: Continuous telemetry Close monitoring of hemodynamics in the ICU setting Hold oral antihypertensive No antihypertensives in setting of shock  Aflutter RVR -recent unsuccessful CV 4/18 Bradycardia, new  P Home Eliquis on hold  Optimize electrolytes  Continuous telemetry   AKI on CKD III with likely progression to ARF  -probably r/t shock (dissection v low output CHF) P: Discussed with family likely need for long term dialysis if CTA repeat and family agrees to proceed  Follow renal function / urine output Trend Bmet Avoid nephrotoxins Ensure adequate renal perfusion   Hypokalemia  Hypocalcemia P Trend Bmet  Supplement as needed   [place]  Goals of care discussion held with family yesterday afternoon 4/21 with decision to  proceed with DO NOT RESUSCITATE CODE STATUS.  Given stability of multisystem organ failure overnight decision has been made to proceed with repeat CTA to assess degree of dissection.  Family is aware patient remains critically ill with poor prognosis.  Best practice (right click and "Reselect all  SmartList Selections" daily)  Diet:  NPO Pain/Anxiety/Delirium protocol (if indicated): Yes (RASS goal -1) VAP protocol (if indicated): Yes DVT prophylaxis: SCD GI prophylaxis: PPI Glucose control:  SSI No Central venous access:  Yes, and it is still needed Arterial line:  Yes, and it is still needed Foley:  Yes, and it is still needed Mobility:  bed rest  PT consulted: N/A Last date of multidisciplinary goals of care discussion [pending. Famnly updated 4/21 by CCM via phone] Code Status:  full code Disposition: ICU    Critical care time:   Performed by: Johnsie Cancel  Total critical care time: 34 minutes  Critical care time was exclusive of separately billable procedures and treating other patients.  Critical care was necessary to treat or prevent imminent or life-threatening deterioration.  Critical care was time spent personally by me on the following activities: development of treatment plan with patient and/or surrogate as well as nursing, discussions with consultants, evaluation of patient's response to treatment, examination of patient, obtaining history from patient or surrogate, ordering and performing treatments and interventions, ordering and review of laboratory studies, ordering and review of radiographic studies, pulse oximetry and re-evaluation of patient's condition.  Eliseo Gum MSN, AGACNP-BC [place] [phone] If no answer, [phone] 08/04/2020, 9:19 AM

## 2020-08-04 NOTE — Progress Notes (Addendum)
Advanced Heart Failure Rounding Note  PCP-Cardiologist: Buford Dresser, MD   Subjective:   CT Surgery - consulted for dissection. Not felt to be surgical candidate.   Remains intubated. Off norepi.Remains on dobutamine 5 mcg.   Febrile   Objective:   Weight Range: 108.1 kg Body mass index is 32.32 kg/m.   Vital Signs:   Temp:  [99.5 F (37.5 C)-101.3 F (38.5 C)] 100.76 F (38.2 C) (04/22 0645) Pulse Rate:  [43-90] 89 (04/22 0400) Resp:  [17-43] 20 (04/22 0645) BP: (79-137)/(32-87) 120/84 (04/22 0645) SpO2:  [91 %-100 %] 100 % (04/22 0645) Arterial Line BP: (104-154)/(60-82) 154/69 (04/22 0645) FiO2 (%):  [40 %] 40 % (04/22 0400) Weight:  [108.1 kg] 108.1 kg (04/22 0451) Last BM Date:  (PTA)  Weight change: Filed Weights   08/07/2020 0051 08/04/20 0451  Weight: 111 kg 108.1 kg    Intake/Output:   Intake/Output Summary (Last 24 hours) at 08/04/2020 0709 Last data filed at 08/04/2020 0700 Gross per 24 hour  Intake 1398.81 ml  Output 825 ml  Net 573.81 ml      Physical Exam    General:  Intubated sedated  HEENT: ETT  Neck: Supple. JVP difficult to assess . Carotids 2+ bilat; no bruits. No lymphadenopathy or thyromegaly appreciated. Cor: PMI nondisplaced. Irregular rate & rhythm. No rubs, gallops or murmurs. Lungs: Clear Abdomen: Soft, nontender, nondistended. No hepatosplenomegaly. No bruits or masses. Good bowel sounds. Extremities: No cyanosis, clubbing, rash, edema Neuro:Intubated    Telemetry  A flutter 90s with occasional PVCs  EKG    n/a  Labs    CBC Recent Labs    07/20/2020 0100 07/31/2020 0647 08/02/2020 2202 08/04/20 0429  WBC 11.1*   < > 11.5* 9.7  NEUTROABS 9.0*  --   --   --   HGB 13.6   < > 13.1 13.2  HCT 37.9*   < > 35.6* 36.4*  MCV 91.8   < > 89.7 89.9  PLT 238   < > 233 221   < > = values in this interval not displayed.   Basic Metabolic Panel Recent Labs    07/21/2020 0232 08/02/2020 0647 08/02/2020 2202  08/04/20 0429  NA  --    < > 136 138  K  --    < > 3.6 3.3*  CL  --    < > 103 104  CO2  --    < > 18* 20*  GLUCOSE  --    < > 104* 89  BUN  --    < > 59* 58*  CREATININE  --    < > 3.05* 3.11*  CALCIUM  --    < > 8.5* 8.7*  MG 2.3  --   --   --    < > = values in this interval not displayed.   Liver Function Tests Recent Labs    08/04/2020 2202 08/04/20 0429  AST 2,222* 1,859*  ALT 1,217* 1,234*  ALKPHOS 37* 40  BILITOT 1.8* 1.7*  PROT 6.0* 6.1*  ALBUMIN 2.6* 2.6*   No results for input(s): LIPASE, AMYLASE in the last 72 hours. Cardiac Enzymes No results for input(s): CKTOTAL, CKMB, CKMBINDEX, TROPONINI in the last 72 hours.  BNP: BNP (last 3 results) Recent Labs    08/04/2020 0100 07/31/2020 2202 08/04/20 0429  BNP 2,066.0* 2,329.1* 1,272.1*    ProBNP (last 3 results) No results for input(s): PROBNP in the last 8760 hours.   D-Dimer No results  for input(s): DDIMER in the last 72 hours. Hemoglobin A1C No results for input(s): HGBA1C in the last 72 hours. Fasting Lipid Panel Recent Labs    07/28/2020 0653  CHOL 129  HDL 38*  LDLCALC 79  TRIG 60  CHOLHDL 3.4   Thyroid Function Tests No results for input(s): TSH, T4TOTAL, T3FREE, THYROIDAB in the last 72 hours.  Invalid input(s): FREET3  Other results:   Imaging    CT HEAD WO CONTRAST  Result Date: 08/04/2020 CLINICAL DATA:  Mental status change. Post code with cardiogenic shock. Left-sided weakness. EXAM: CT HEAD WITHOUT CONTRAST TECHNIQUE: Contiguous axial images were obtained from the base of the skull through the vertex without intravenous contrast. COMPARISON:  None. FINDINGS: Brain: Mild atrophy.  Negative for hydrocephalus Moderately large hypodensity in the right occipital parietal lobe compatible with chronic lobar infarction. Negative for acute infarct, hemorrhage, mass Vascular: Negative for hyperdense vessel Skull: Negative Sinuses/Orbits: Paranasal sinuses clear. NG tube and endotracheal tube  in place. Negative orbit. Other: None IMPRESSION: No acute abnormality. Chronic infarct right occipital parietal lobe. Electronically Signed   By: Franchot Gallo M.D.   On: 08/12/2020 09:28   DG CHEST PORT 1 VIEW  Result Date: 08/06/2020 CLINICAL DATA:  Infection due to triple-lumen catheter. EXAM: PORTABLE CHEST 1 VIEW COMPARISON:  CT 07/27/2020.  Chest x-ray 07/25/2020. FINDINGS: Endotracheal tube tip 4 cm above the lower portion of the carina. NG tube tip below left hemidiaphragm. Right subclavian line with tip over SVC. Cardiomegaly. No pulmonary venous congestion. Mild left perihilar infiltrate cannot be excluded. Stable elevation left hemidiaphragm. No prominent pleural effusion. No pneumothorax. IMPRESSION: 1.  Lines and tubes in good anatomic position. 2.  Cardiomegaly.  No pulmonary venous congestion. 3.  Mild left perihilar infiltrate cannot be excluded. Electronically Signed   By: Marcello Moores  Register   On: 07/15/2020 12:22   ECHOCARDIOGRAM COMPLETE  Result Date: 07/23/2020    ECHOCARDIOGRAM REPORT   Patient Name:   Logan Robertson Date of Exam: 08/12/2020 Medical Rec #:  [id number]        Height:       72.0 in Accession #:    [id number]       Weight:       244.7 lb Date of Birth:  [birth year]        BSA:          2.321 m Patient Age:    65 years         BP:           63/52 mmHg Patient Gender: M                HR:           83 bpm. Exam Location:  Inpatient Procedure: 2D Echo, Cardiac Doppler, Color Doppler and Intracardiac            Opacification Agent Indications:    Dyspnea  History:        Patient has prior history of Echocardiogram examinations, most                 recent 09/29/2019. Previous Myocardial Infarction; Risk                 Factors:Hypertension.  Sonographer:    [place] Referring Phys: [id number] Darreld Lakasha Mcfall  Sonographer Comments: Patient is morbidly obese. 09/30/19 cath 07/31/20 cardioversion IMPRESSIONS  1. Left ventricular ejection fraction, by estimation, is 15%. The  left ventricle has severely decreased function.  The left ventricle demonstrates global hypokinesis. The left ventricular internal cavity size was moderately dilated. Left ventricular diastolic parameters are indeterminate.  2. Right ventricular systolic function is normal. The right ventricular size is normal. There is mildly elevated pulmonary artery systolic pressure.  3. Left atrial size was mildly dilated.  4. The mitral valve is normal in structure. Mild mitral valve regurgitation. No evidence of mitral stenosis.  5. Tricuspid valve regurgitation is severe.  6. The aortic valve is normal in structure. Aortic valve regurgitation is mild. No aortic stenosis is present.  7. Consider f/u CTA to evaluate aorta . Aortic dilatation noted. There is severe dilatation of the ascending aorta, measuring 54 mm.  8. The inferior vena cava is normal in size with greater than 50% respiratory variability, suggesting right atrial pressure of 3 mmHg. FINDINGS  Left Ventricle: Left ventricular ejection fraction, by estimation, is 15%. The left ventricle has severely decreased function. The left ventricle demonstrates global hypokinesis. Definity contrast agent was given IV to delineate the left ventricular endocardial borders. The left ventricular internal cavity size was moderately dilated. There is no left ventricular hypertrophy. Left ventricular diastolic parameters are indeterminate. Right Ventricle: The right ventricular size is normal. No increase in right ventricular wall thickness. Right ventricular systolic function is normal. There is mildly elevated pulmonary artery systolic pressure. The tricuspid regurgitant velocity is 2.69  m/s, and with an assumed right atrial pressure of 8 mmHg, the estimated right ventricular systolic pressure is 03.4 mmHg. Left Atrium: Left atrial size was mildly dilated. Right Atrium: Right atrial size was normal in size. Pericardium: There is no evidence of pericardial effusion. Mitral Valve:  The mitral valve is normal in structure. Mild mitral valve regurgitation. No evidence of mitral valve stenosis. Tricuspid Valve: The tricuspid valve is normal in structure. Tricuspid valve regurgitation is severe. No evidence of tricuspid stenosis. Aortic Valve: The aortic valve is normal in structure. Aortic valve regurgitation is mild. Aortic regurgitation PHT measures 2242 msec. No aortic stenosis is present. Aortic valve mean gradient measures 1.5 mmHg. Aortic valve peak gradient measures 2.3 mmHg. Aortic valve area, by VTI measures 3.20 cm. Pulmonic Valve: The pulmonic valve was normal in structure. Pulmonic valve regurgitation is mild. No evidence of pulmonic stenosis. Aorta: Consider f/u CTA to evaluate aorta. The aortic root is normal in size and structure and aortic dilatation noted. There is severe dilatation of the ascending aorta, measuring 54 mm. Venous: The inferior vena cava is normal in size with greater than 50% respiratory variability, suggesting right atrial pressure of 3 mmHg. IAS/Shunts: No atrial level shunt detected by color flow Doppler.  LEFT VENTRICLE PLAX 2D LVIDd:         5.60 cm      Diastology LVIDs:         5.00 cm      LV e' medial:    3.26 cm/s LV PW:         1.50 cm      LV E/e' medial:  27.5 LV IVS:        1.00 cm      LV e' lateral:   5.12 cm/s LVOT diam:     2.30 cm      LV E/e' lateral: 17.5 LV SV:         35 LV SV Index:   15 LVOT Area:     4.15 cm  LV Volumes (MOD) LV vol d, MOD A4C: 175.0 ml LV vol s, MOD A4C: 174.0 ml  LV SV MOD A4C:     175.0 ml RIGHT VENTRICLE RV S prime:     9.81 cm/s TAPSE (M-mode): 1.3 cm LEFT ATRIUM              Index       RIGHT ATRIUM           Index LA Vol (A2C):   136.0 ml 58.59 ml/m RA Area:     25.70 cm LA Vol (A4C):   106.0 ml 45.66 ml/m RA Volume:   87.80 ml  37.82 ml/m LA Biplane Vol: 121.0 ml 52.13 ml/m  AORTIC VALVE                   PULMONIC VALVE AV Area (Vmax):    3.39 cm    PV Vmax:       0.58 m/s AV Area (Vmean):   3.20 cm    PV  Vmean:      43.100 cm/s AV Area (VTI):     3.20 cm    PV VTI:        0.118 m AV Vmax:           75.30 cm/s  PV Peak grad:  1.3 mmHg AV Vmean:          49.200 cm/s PV Mean grad:  1.0 mmHg AV VTI:            0.108 m AV Peak Grad:      2.3 mmHg AV Mean Grad:      1.5 mmHg LVOT Vmax:         61.35 cm/s LVOT Vmean:        37.900 cm/s LVOT VTI:          0.083 m LVOT/AV VTI ratio: 0.77 AI PHT:            2242 msec  AORTA Ao Root diam: 4.10 cm Ao Asc diam:  5.10 cm MITRAL VALVE                 TRICUSPID VALVE MV Area (PHT): 4.06 cm      TR Peak grad:   28.9 mmHg MV Decel Time: 187 msec      TR Vmax:        269.00 cm/s MR Peak grad:    54.5 mmHg MR Mean grad:    35.0 mmHg   SHUNTS MR Vmax:         369.00 cm/s Systemic VTI:  0.08 m MR Vmean:        275.0 cm/s  Systemic Diam: 2.30 cm MR PISA:         0.57 cm MR PISA Eff ROA: 3 mm MR PISA Radius:  0.30 cm MV E velocity: 89.50 cm/s MV A velocity: 22.30 cm/s MV E/A ratio:  4.01 Jenkins Rouge MD Electronically signed by Jenkins Rouge MD Signature Date/Time: 07/23/2020/8:53:49 AM    Final    CT Angio Chest/Abd/Pel for Dissection W and/or W/WO  Result Date: 07/20/2020 CLINICAL DATA:  Known thoracic aortic aneurysm assess for complications/dissection. EXAM: CT ANGIOGRAPHY CHEST, ABDOMEN AND PELVIS TECHNIQUE: Non-contrast CT of the chest was initially obtained. Multidetector CT imaging through the chest, abdomen and pelvis was performed using the standard protocol during bolus administration of intravenous contrast. Multiplanar reconstructed images and MIPs were obtained and reviewed to evaluate the vascular anatomy. CONTRAST:  187m OMNIPAQUE IOHEXOL 350 MG/ML SOLN COMPARISON:  CT chest July 24, 2020. FINDINGS: CTA CHEST FINDINGS Cardiovascular: No evidence of intramural hematoma on  precontrast imaging. Unchanged size of the ascending aortic aneurysm measuring 5.4 cm on image 61/7. At the aortic arch on postcontrast imaging there is some hypodense fluid beginning at the  proximal aortic arch and extending into the proximal descending aorta without a discrete luminal flap visualized, this is favored to represent mixing artifact due to additional contrast bolus related to patient's port cardiac output. However, given that the proximal aspect of the defect is sharper and more distinct than would be expected for this type of artifact an early dissection flap is not completely excluded if continued clinical concern for acute aortic syndrome consider further evaluation with cardiac ECHO. Reflux of contrast into the IVC and hepatic veins. Aortic atherosclerosis. Cardiomegaly. No suspicious intracardiac filling defects. No significant pericardial effusion/thickening. Mediastinum/Nodes: No discrete thyroid nodularity. No pathologically enlarged mediastinal, hilar or axillary lymph nodes. Endotracheal tube with tip in the distal thoracic trachea. Enteric tube with tip in the stomach. Lungs/Pleura: Small bilateral pleural effusions. Left greater than right lower lobe airspace consolidation. Left upper lobe airspace opacity with multiple other small patchy areas of airspace opacities. No pneumothorax. Musculoskeletal: Diffuse idiopathic skeletal hyperostosis. No acute osseous abnormality. Review of the MIP images confirms the above findings. CTA ABDOMEN AND PELVIS FINDINGS VASCULAR: The abdominal vasculature is poorly opacified secondary to patient's cardiac dysfunction, within this context. Aorta: Aortic atherosclerosis.  No abdominal aortic aneurysm. Celiac: Patent without evidence of aneurysm, dissection, vasculitis or significant stenosis. SMA: Patent without evidence of aneurysm, dissection, vasculitis or significant stenosis. Renals: Both renal arteries are patent without evidence of aneurysm, dissection, vasculitis, fibromuscular dysplasia or significant stenosis. IMA: Patent without evidence of aneurysm, dissection, vasculitis or significant stenosis. Inflow: Patent without evidence of  aneurysm, dissection, vasculitis or significant stenosis. Veins: No obvious venous abnormality within the limitations of this arterial phase study. Review of the MIP images confirms the above findings. NON-VASCULAR Hepatobiliary: No suspicious hepatic lesion. Reflux of contrast into the hepatic veins. Gallbladder is mildly distended without overt wall thickening, pericholecystic fluid or radiopaque calculi. No biliary ductal dilation. Pancreas: Unremarkable. No pancreatic ductal dilatation or surrounding inflammatory changes. Spleen: Within normal limits. Adrenals/Urinary Tract: Bilateral adrenal glands are unremarkable. No hydronephrosis. Right kidneys unremarkable. Hypodense 3.3 cm left lower pole renal lesion and exophytic 1.8 cm interpolar renal lesion, most consistent with a renal cyst. Urinary bladder is decompressed limiting evaluation. Stomach/Bowel: Enteric tube in the stomach. Otherwise stomach is grossly unremarkable. No suspicious small bowel wall thickening or dilation. Normal appendix. No suspicious colonic wall thickening or mass like lesions. Lymphatic: No gastrohepatic or hepatoduodenal ligament lymphadenopathy. No retroperitoneal or mesenteric lymphadenopathy. No pelvic sidewall lymphadenopathy. No groin lymphadenopathy. Reproductive: Prostate is unremarkable. Other: No abdominopelvic ascites. Musculoskeletal: Mild lumbar spondylosis. No acute osseous abnormality. Review of the MIP images confirms the above findings. IMPRESSION: 1. Hypodense material in the aortic arch on postcontrast imaging, beginning at the proximal aortic arch and extending into the proximal descending aorta without a discrete luminal flap visualized. Which is favored represent mixing artifact due to multiple contrast boluses and poor cardiac output. However, given that the proximal aspect of the defect is sharper and more distinct than would be expected for this type of artifact an early dissection flap is not completely  excluded. If continued clinical concern for acute aortic syndrome consider further evaluation with cardiac ECHO. 2. Unchanged size of the ascending aortic aneurysm measuring 5.4 cm. No intramural hematoma or abdominal aortic aneurysm. 3. Small bilateral pleural effusions with multifocal airspace consolidations, most consistent with multifocal pneumonia.  4. Cardiomegaly with reflux of contrast into the hepatic veins and IVC indicative of poor cardiac function. 5. Mildly distended gallbladder without overt wall thickening, pericholecystic fluid or radiopaque calculi. Recommend correlation with right upper quadrant and possible ultrasound. Electronically Signed   By: Dahlia Bailiff MD   On: 07/24/2020 10:05      Medications:     Scheduled Medications: . atorvastatin  80 mg Per Tube Daily  . chlorhexidine gluconate (MEDLINE KIT)  15 mL Mouth Rinse BID  . Chlorhexidine Gluconate Cloth  6 each Topical Daily  . docusate  100 mg Per Tube BID  . mouth rinse  15 mL Mouth Rinse 10 times per day  . pantoprazole (PROTONIX) IV  40 mg Intravenous Daily  . polyethylene glycol  17 g Per Tube Daily     Infusions: . sodium chloride    . dexmedetomidine (PRECEDEX) IV infusion 0.5 mcg/kg/hr (08/04/20 0700)  . DOBUTamine 5 mcg/kg/min (08/04/20 0700)  . fentaNYL infusion INTRAVENOUS 150 mcg/hr (08/04/20 0700)  . norepinephrine (LEVOPHED) Adult infusion Stopped (08/04/20 0634)     PRN Medications:  acetaminophen, albuterol, fentaNYL, ondansetron (ZOFRAN) IV     Assessment/Plan   1. Acute aortic syndrome: Patient had known 5.4 cm ascending aortic aneurysm with mild-moderate AI.  CT and TEE both showed abnormality involving distal ascending aorta through the arch to the descending aorta.  CTA was not clear but concerning for early dissection.  TEE was concerning for dissection flap starting distal ascending aorta. Dr Aundra Dubin discussed the patient with Drs Tami Ribas, and Margaretann Loveless and he was not a  surgical candidate for repair of his ascending aorta given severe biventricular failure and AKI with anuria.   2. Cardiogenic shock: Baseline nonischemic cardiomyopathy with normal coronaries in 6/21, prior echo with EF 35-40%. Echo this admission with EF 15%, moderate RV dysfunction, mild-moderate AI.   Off Norepi. On Dobutamine 5 mcg.  - Mechanical support not option at this time with concern for type A dissection.  3. Atrial flutter: Chronic, failed DCCV earlier this week.   -Rate controlled.  - Hold Eliquis for now.  4. AKI: Creatinine up to 2.5 recently, possible contrast nephropathy post-CTA on 4/11.  Creatinine trending up 3.1 , made 475 cc overnight.  Concern that the patient will have progressive contrast nephropathy.  5. Elevated LFTs: Shock liver.   LFTs trending down.  6. Acute Hypoxic Respiratory Failure Intubated FiO2 40%. 7. ID  WBC 9.7. Febrile overnight.   Additional plan per Dr Aundra Dubin.     Length of Stay: 1  Amy Clegg, NP  08/04/2020, 7:09 AM  Advanced Heart Failure Team Pager [phone] (M-F; 7a - 5p)  Please contact [facility] for night-coverage after hours (5p -7a ) and weekends on [email or web address]   Patient seen with NP, agree with the above note.   He is more stable this morning.  Off NE, on dobutamine 5.  No co-ox yet. No CVP set up.  Febrile to 101.3 overnight.  UOP 475, creatinine up to 3.11. He remains in rate-controlled atrial flutter.   He has not been responsive but still sedated.   General: Intubated/sedated.  Neck: JVP 8-9 cm, no thyromegaly or thyroid nodule.  Lungs: Crackles at bases.  CV: Nondisplaced PMI.  Heart irregular S1/S2, no S3/S4, no murmur.  No peripheral edema.   Abdomen: Soft, nontender, no hepatosplenomegaly, no distention.  Skin: Intact without lesions or rashes.  Neurologic: Sedated Extremities: No clubbing or cyanosis.  HEENT: Normal.   Cardiogenic shock, now  off NE and on dobutamine 5.  - Send co-ox this morning.  - Not  candidate for mechanical support at this time with concern for type A dissection.   Worsening creatinine, concern for AKI from contrast and cardiogenic shock, worried that he is going to head towards dialysis-dependence.  UOP 475 cc yesterday.   Febrile to 101.3 overnight.  Send cultures and get CXR.  Would cover empirically with vancomycin/cefepime.   Mental status uncertain, need to wean sedation.   CT and TEE yesterday both showed abnormality involving distal ascending aorta through the arch to the descending aorta. CTA was not clear but concerning for early dissection.  TEE was concerning for dissection flap starting distal ascending aorta. I discussed the patient with Drs Tami Ribas, and Margaretann Loveless and he was not a surgical candidate for repair of his ascending aorta given severe biventricular failure/cardiogenic shock and AKI with anuria.  Hemodynamically, he seems to be doing better today.  Discussed again with Drs Tamala Julian and Orvan Seen, will re-CTA his chest (may need repeat TEE) to reassess his aorta (hope to get a better evaluation by CTA).  Think he is headed towards ESRD at this point, discussed contrast risk with family.  Will re-discuss with family after CTA, think if he were to go for dissection repair, peri-operative risk of death would still be very high.  CRITICAL CARE Performed by: Loralie Champagne  Total critical care time: 40 minutes  Critical care time was exclusive of separately billable procedures and treating other patients.  Critical care was necessary to treat or prevent imminent or life-threatening deterioration.  Critical care was time spent personally by me on the following activities: development of treatment plan with patient and/or surrogate as well as nursing, discussions with consultants, evaluation of patient's response to treatment, examination of patient, obtaining history from patient or surrogate, ordering and performing treatments and interventions, ordering and  review of laboratory studies, ordering and review of radiographic studies, pulse oximetry and re-evaluation of patient's condition.  Loralie Champagne 08/04/2020 8:25 AM

## 2020-08-04 NOTE — Plan of Care (Signed)

## 2020-08-04 NOTE — Plan of Care (Signed)
  Problem: Clinical Measurements: Goal: Respiratory complications will improve Outcome: Progressing   Problem: Clinical Measurements: Goal: Will remain free from infection Outcome: Progressing   Problem: Skin Integrity: Goal: Risk for impaired skin integrity will decrease Outcome: Progressing

## 2020-08-04 NOTE — Progress Notes (Signed)
PCCM progress note  Called to room urgently with reports of seizure activity.  On arrival patient was seen with disconjugate upper gaze, rhythmic upper torso twitching worse on the right with involvement of right lower extremity as well.  Concern for acute onset seizures. Order given for immediate 2 mg IV Versed.  Dr. Tamala Julian then arrived at bedside and ordered an additional 2 mg of Versed with 80 mg of rocuronium.  Seizure activity stopped with administration of benzodiazepine.  Order placed for propofol drip  Once stabilized try to obtain stat CTA and head CT.  Patient remains critically ill with signs of decompensation, family updated at bedside  Logan Cancel, NP-C [facility] Personal contact information can be found on Amion  08/04/2020, 11:29 AM

## 2020-08-04 NOTE — Progress Notes (Signed)
Patient transported to CT and returned to 6Y56 without complications.

## 2020-08-04 NOTE — Progress Notes (Signed)
Pharmacy Antibiotic Note  Logan Robertson is a 65 y.o. male admitted on 08/06/2020 with shortness of breath and aflutter.  Pharmacy has been consulted for cefepime and vancomycin dosing.  Patient noted to have cardiac shock yesterday, febrile to 101.3 overnight, wbc is normal. Situation complicated by possible dissection and currently not a good surgical candidate. Now with AKI with scr up to 3.1 today. Orders to start broad antibioitics given several interventions yesterday.   Plan: Cefepime 2g q24 hours  Vancomycin 2.5g IV now then 1500mg  IV q48 unless renal function worsens then may need to dose based on random levels Cultures sent Will follow fever curve  Height: 6' (182.9 cm) Weight: 108.1 kg (238 lb 5.1 oz) IBW/kg (Calculated) : 77.6  Temp (24hrs), Avg:100.9 F (38.3 C), Min:99.5 F (37.5 C), Max:101.3 F (38.5 C)  Recent Labs  Lab 07/31/20 0810 07/27/2020 0100 07/19/2020 0653 07/19/2020 0657 07/21/2020 1217 07/28/2020 2202 08/04/20 0429  WBC  --  11.1* 8.8  --  9.4 11.5* 9.7  CREATININE 2.50* 2.23*  --   --  2.85* 3.05* 3.11*  LATICACIDVEN  --   --   --  8.6* 3.1*  --   --     Estimated Creatinine Clearance: 29.3 mL/min (A) (by C-G formula based on SCr of 3.11 mg/dL (H)).    Allergies  Allergen Reactions  . Lisinopril Swelling    Angioedema - denies airway complications Tolerates ARBs    Antimicrobials this admission: Cefepime 4/22> Vanc 4/22>   Microbiology results: Pending  Thank you for allowing pharmacy to be a part of this patient's care.  Erin Hearing PharmD., BCPS Clinical Pharmacist 08/04/2020 11:31 AM

## 2020-08-05 DIAGNOSIS — I5023 Acute on chronic systolic (congestive) heart failure: Secondary | ICD-10-CM | POA: Diagnosis not present

## 2020-08-05 DIAGNOSIS — I4892 Unspecified atrial flutter: Secondary | ICD-10-CM | POA: Diagnosis not present

## 2020-08-05 LAB — CBC
HCT: 35.4 % — ABNORMAL LOW (ref 39.0–52.0)
Hemoglobin: 12.7 g/dL — ABNORMAL LOW (ref 13.0–17.0)
MCH: 32.8 pg (ref 26.0–34.0)
MCHC: 35.9 g/dL (ref 30.0–36.0)
MCV: 91.5 fL (ref 80.0–100.0)
Platelets: 205 10*3/uL (ref 150–400)
RBC: 3.87 MIL/uL — ABNORMAL LOW (ref 4.22–5.81)
RDW: 16.1 % — ABNORMAL HIGH (ref 11.5–15.5)
WBC: 9.4 10*3/uL (ref 4.0–10.5)
nRBC: 0 % (ref 0.0–0.2)

## 2020-08-05 LAB — COMPREHENSIVE METABOLIC PANEL
ALT: 1050 U/L — ABNORMAL HIGH (ref 0–44)
AST: 578 U/L — ABNORMAL HIGH (ref 15–41)
Albumin: 2.4 g/dL — ABNORMAL LOW (ref 3.5–5.0)
Alkaline Phosphatase: 41 U/L (ref 38–126)
Anion gap: 12 (ref 5–15)
BUN: 51 mg/dL — ABNORMAL HIGH (ref 8–23)
CO2: 21 mmol/L — ABNORMAL LOW (ref 22–32)
Calcium: 8.4 mg/dL — ABNORMAL LOW (ref 8.9–10.3)
Chloride: 106 mmol/L (ref 98–111)
Creatinine, Ser: 2.93 mg/dL — ABNORMAL HIGH (ref 0.61–1.24)
GFR, Estimated: 23 mL/min — ABNORMAL LOW (ref >60)
Glucose, Bld: 92 mg/dL (ref 70–99)
Potassium: 3.6 mmol/L (ref 3.5–5.1)
Sodium: 139 mmol/L (ref 135–145)
Total Bilirubin: 2.4 mg/dL — ABNORMAL HIGH (ref 0.3–1.2)
Total Protein: 5.7 g/dL — ABNORMAL LOW (ref 6.5–8.1)

## 2020-08-05 LAB — COOXEMETRY PANEL
Carboxyhemoglobin: 0.9 % (ref 0.5–1.5)
Methemoglobin: 0.8 % (ref 0.0–1.5)
O2 Saturation: 67.6 %
Total hemoglobin: 12.9 g/dL (ref 12.0–16.0)

## 2020-08-05 LAB — MAGNESIUM: Magnesium: 1.9 mg/dL (ref 1.7–2.4)

## 2020-08-05 LAB — TRIGLYCERIDES: Triglycerides: 144 mg/dL (ref <150)

## 2020-08-05 LAB — HEPARIN LEVEL (UNFRACTIONATED): Heparin Unfractionated: 0.51 IU/mL (ref 0.30–0.70)

## 2020-08-05 LAB — PROCALCITONIN: Procalcitonin: 1.71 ng/mL

## 2020-08-05 LAB — APTT: aPTT: 28 seconds (ref 24–36)

## 2020-08-05 MED ORDER — ACETAMINOPHEN 650 MG RE SUPP
650.0000 mg | Freq: Four times a day (QID) | RECTAL | Status: DC | PRN
  Administered 2020-08-07 (×2): 650 mg via RECTAL
  Filled 2020-08-05 (×2): qty 1

## 2020-08-05 MED ORDER — LORAZEPAM 2 MG/ML IJ SOLN
4.0000 mg | Freq: Once | INTRAMUSCULAR | Status: AC
  Administered 2020-08-05: 4 mg via INTRAVENOUS

## 2020-08-05 MED ORDER — GLYCOPYRROLATE 1 MG PO TABS
1.0000 mg | ORAL_TABLET | ORAL | Status: DC | PRN
  Filled 2020-08-05: qty 1

## 2020-08-05 MED ORDER — SODIUM CHLORIDE 0.9 % IV SOLN
INTRAVENOUS | Status: DC | PRN
  Administered 2020-08-05: 1000 mL via INTRAVENOUS

## 2020-08-05 MED ORDER — GLYCOPYRROLATE 0.2 MG/ML IJ SOLN
0.2000 mg | INTRAMUSCULAR | Status: DC | PRN
  Administered 2020-08-05 – 2020-08-06 (×3): 0.2 mg via INTRAVENOUS
  Filled 2020-08-05 (×3): qty 1

## 2020-08-05 MED ORDER — FUROSEMIDE 10 MG/ML IJ SOLN
60.0000 mg | Freq: Once | INTRAMUSCULAR | Status: AC
  Administered 2020-08-05: 60 mg via INTRAVENOUS
  Filled 2020-08-05: qty 6

## 2020-08-05 MED ORDER — GLYCOPYRROLATE 0.2 MG/ML IJ SOLN: 0.2000 mg | INTRAMUSCULAR | Status: DC | PRN

## 2020-08-05 MED ORDER — POTASSIUM CHLORIDE 10 MEQ/50ML IV SOLN
10.0000 meq | INTRAVENOUS | Status: AC
  Administered 2020-08-05 (×3): 10 meq via INTRAVENOUS
  Filled 2020-08-05 (×3): qty 50

## 2020-08-05 MED ORDER — HEPARIN (PORCINE) 25000 UT/250ML-% IV SOLN
1700.0000 [IU]/h | INTRAVENOUS | Status: DC
  Administered 2020-08-05: 1700 [IU]/h via INTRAVENOUS
  Filled 2020-08-05: qty 250

## 2020-08-05 MED ORDER — PROSOURCE TF PO LIQD: 45.0000 mL | Freq: Two times a day (BID) | ORAL | Status: DC

## 2020-08-05 MED ORDER — MAGNESIUM SULFATE 2 GM/50ML IV SOLN
2.0000 g | Freq: Once | INTRAVENOUS | Status: AC
  Administered 2020-08-05: 2 g via INTRAVENOUS
  Filled 2020-08-05: qty 50

## 2020-08-05 MED ORDER — VITAL HIGH PROTEIN PO LIQD: 1000.0000 mL | ORAL | Status: DC

## 2020-08-05 MED ORDER — LORAZEPAM 2 MG/ML IJ SOLN
INTRAMUSCULAR | Status: AC
  Filled 2020-08-05: qty 2

## 2020-08-05 MED ORDER — ACETAMINOPHEN 325 MG PO TABS: 650.0000 mg | ORAL_TABLET | Freq: Four times a day (QID) | ORAL | Status: DC | PRN

## 2020-08-05 MED ORDER — LEVETIRACETAM IN NACL 1000 MG/100ML IV SOLN
1000.0000 mg | Freq: Two times a day (BID) | INTRAVENOUS | Status: DC
  Administered 2020-08-05: 1000 mg via INTRAVENOUS
  Filled 2020-08-05: qty 100

## 2020-08-05 MED ORDER — VITAL HIGH PROTEIN PO LIQD
1000.0000 mL | ORAL | Status: DC
  Administered 2020-08-05: 1000 mL

## 2020-08-05 MED ORDER — POLYVINYL ALCOHOL 1.4 % OP SOLN: 1.0000 [drp] | Freq: Four times a day (QID) | OPHTHALMIC | Status: DC | PRN

## 2020-08-05 MED ORDER — FENTANYL BOLUS VIA INFUSION
100.0000 ug | INTRAVENOUS | Status: DC | PRN
  Filled 2020-08-05: qty 100

## 2020-08-05 MED ORDER — ACETAMINOPHEN 325 MG PO TABS
650.0000 mg | ORAL_TABLET | Freq: Four times a day (QID) | ORAL | Status: DC
  Administered 2020-08-05: 650 mg
  Filled 2020-08-05: qty 2

## 2020-08-05 MED ORDER — PROSOURCE TF PO LIQD
90.0000 mL | Freq: Three times a day (TID) | ORAL | Status: DC
  Administered 2020-08-05: 90 mL
  Filled 2020-08-05: qty 90

## 2020-08-05 MED ORDER — DIPHENHYDRAMINE HCL 50 MG/ML IJ SOLN
25.0000 mg | INTRAMUSCULAR | Status: DC | PRN
  Administered 2020-08-05: 25 mg via INTRAVENOUS
  Filled 2020-08-05: qty 1

## 2020-08-05 MED ORDER — DEXTROSE 5 % IV SOLN: INTRAVENOUS | Status: DC

## 2020-08-05 MED ORDER — FENTANYL BOLUS VIA INFUSION
200.0000 ug | Freq: Once | INTRAVENOUS | Status: AC
  Administered 2020-08-05: 200 ug via INTRAVENOUS
  Filled 2020-08-05: qty 200

## 2020-08-05 NOTE — Progress Notes (Signed)
NAME:  Logan Robertson, MRN:  [id number], DOB:  25-Jul-1955, LOS: 2 ADMISSION DATE:  08/10/20, CONSULTATION DATE:  Aug 10, 2020 REFERRING MD:  Sarajane Jews - Cards, CHIEF COMPLAINT:  AMS, impending doom   History of Present Illness:  65 yo M PMH HFrEF (35%), Aflutter (unsuccessful CV), 5.4cm TAA, CKD III who presented to ED 4/21 with progressive SOB and chest pain after unsuccessful CV Monday 4/18. Stable initially in ED 4/21, ECGs without evidence of MI. BNP >2000. Admitted to cards for further management and care.  While in ED had progressive dyspnea and was placed on BiPAP, amio for Aflutter. Received 0.5 ativan, and hours later became  Less responsive, and noted to have cool extremities. ABG without hypercarbia or hypoxia.  PCCM consulted for central line placement and medical management with concern for low output state.  On CCM arrival, pt minimally responsive, RR 40s, abdominal muscle use on BiPAP and required emergent intubation as well as pressors for shock. When intubated, had spontaneous R sided movement but flaccid L side.    Pertinent  Medical History  TAA HFrEF Aflutter  HTN OSA on CPAP  CKD III  Significant Hospital Events: Including procedures, antibiotic start and stop dates in addition to other pertinent events   . 4/21 ED with SOB chest pain. Emergently intubated by CCM. Bedside echo c/f cardiogenic shock -- aorta dilated and c/f possible dissection. Rapidly requiring high dose pressors. Going for STAT CTA chest CT H. Will admit to ICU.  Poorly timed CTA, something strange going on in aorta.  TEE showing probable ascending flap and extensive aortic clot presumed clotted false lumen . 4/22 , repeat CTA again poorly timed, shows new PE and RLL PNA.  Developed seizures requiring heavy sedation; repeat head CT neg  Interim History / Subjective:  He is suprisingly making urine and labs improving. Remains on high dose sedatives.   Objective   Blood pressure (!) 133/91, pulse (!) 111,  temperature (!) 102.2 F (39 C), resp. rate 20, height 6' (1.829 m), weight 107.1 kg, SpO2 100 %. CVP:  [1 mmHg-13 mmHg] 11 mmHg  Vent Mode: PRVC FiO2 (%):  [40 %] 40 % Set Rate:  [20 bmp] 20 bmp Vt Set:  [580 mL] 580 mL PEEP:  [8 cmH20] 8 cmH20 Plateau Pressure:  [18 cmH20] 18 cmH20   Intake/Output Summary (Last 24 hours) at 08/05/2020 3536 Last data filed at 08/05/2020 0700 Gross per 24 hour  Intake 1777.54 ml  Output 1265 ml  Net 512.54 ml   Filed Weights   2020-08-10 0051 08/04/20 0451 08/05/20 0459  Weight: 111 kg 108.1 kg 107.1 kg    Examination: Constitutional: ill appearing man on vent  Eyes: pupils pinpoint, R>L corneals present, not tracking, no doll's eyes Ears, nose, mouth, and throat: ETT in place, minimal secretions Cardiovascular: tachycardic, irregular, ext warm Respiratory: crackles bilaterally, passive on vent Gastrointestinal: soft, hypoactive BS Skin: No rashes, normal turgor Neurologic: GCS 3 at present, has good cough Psychiatric: cannot assess   Labs/imaging that I havepersonally reviewed    4/21 CT H>> no acute findings chronic infarct right occipital parietal lobe  4/21 CTA chest>> concern for aortic dissection but limited exam see full report for further details  4/21 ECHO>> EF 15% with severely decreased LV function and global hypokinesis  4/21: LA 8.6 , Trop 420 , BNP 2066, WBC 11, K 3.1 BUN 49 Cr 2.23 iCal 1.10  4/22: Potassium 3.3, creatinine 3.11, severely elevated LFTs but downtrending from day prior, BNP  also decreased 1 day prior  4/23 improving renal, liver indices, CBC stable, lactate normal, coox improved  Resolved Hospital Problem list     Assessment & Plan:  Multiorgan failure (Renal, CNS, liver, respiratory) - in setting of acute on chronic systolic heart failure and suspected severe aortic injury.  Current diagnostics complicated by inability to time contrast correctly and poor TEE windows.  Suprisingly his liver/kidneys/heart  are doing better today.  He did go into seizures yesterday which is concerning for anoxic injury although repeat head CT neg. RLL PNA vs. Atelectasis L sided PE without R heart strain  - Lighten sedation today to see if wakes up - If wakes up and organs doing better, may reverse recommendation with family and advocate for pushing forward with eventual OR should we confirm dissection; will be tricky discussions as do not want to get their hopes up too much - Continue vent support, abx - Start heparin drip understanding high risks of this  Best practice (right click and "Reselect all SmartList Selections" daily)  Diet:  Start TF Pain/Anxiety/Delirium protocol (if indicated): Yes (RASS goal -1) VAP protocol (if indicated): Yes DVT prophylaxis: AC GI prophylaxis: PPI Glucose control:  SSI No Central venous access:  Yes, and it is still needed Arterial line:  Yes, and it is still needed Foley:  Yes, and it is still needed Mobility:  bed rest  PT consulted: N/A Last date of multidisciplinary goals of care discussion daily Code Status:  full code Disposition: ICU    Patient critically ill due to multiorgan failure Interventions to address this today pressor titration, sedation titration Risk of deterioration without these interventions is high  I personally spent 41 minutes providing critical care not including any separately billable procedures  Erskine Emery MD [facility]  Prefer epic messenger for cross cover needs If after hours, please call E-link

## 2020-08-05 NOTE — Progress Notes (Signed)
Initial Nutrition Assessment  RD working remotely.  DOCUMENTATION CODES:   Obesity unspecified  INTERVENTION:   Tube feeding via OG tube: - Vital High Protein @ 50 ml/hr (1200 ml/day) - ProSource TF 90 ml TID  Tube feeding regimen provides 1440 kcal, 171 grams of protein, and 1003 ml of H2O.   Tube feeding regimen and current propofol provides 1612 total kcal.  NUTRITION DIAGNOSIS:   Inadequate oral intake related to inability to eat as evidenced by NPO status.  GOAL:   Provide needs based on ASPEN/SCCM guidelines  MONITOR:   Vent status,Labs,Weight trends,TF tolerance,I & O's  REASON FOR ASSESSMENT:   Ventilator,Consult Enteral/tube feeding initiation and management  ASSESSMENT:   65 year old male who presented to the ED on 4/21 with SOB and chest pain. PMH of atrial flutter with RVR (failed DCCV earlier this week), CHF, HTN, HLD, sleep apnea, prior CVA, CKD stage III, and ascending aortic aneurysm. Pt's respiratory status declined and he required intubation in the ED. Pt admitted with acute respiratory failure secondary to cardiogenic shock.   Per CCM, pt with multiorgan failure (renal, CNS, liver, respiratory) in setting of acute on chronic systolic heart failure and suspected severe aortic injury. Pt developed seizures on 4/21 but repeat head CT was negative.  Per CCM note today, pt has started making urine and labs improving. Due to improvements, plan is to lighten sedation to see if pt wakes up.  RD consulted for tube feeding initiation and management. OGT in stomach per x-ray on 4/22, currently to low intermittent suction.  Admit weight: 111 kg Current weight: 107.1 kg  Patient is currently intubated on ventilator support MV: 11.2 L/min Temp (24hrs), Avg:101.7 F (38.7 C), Min:99.7 F (37.6 C), Max:102.56 F (39.2 C) BP (a-line): 112/52 MAP (a-line): 69  Drips: Propofol: 6.5 ml/hr (provides 172 kcal daily from  lipid) Precedex Dobutamine Fentanyl  Medications reviewed and include: colace, IV protonix, miralax, IV abx, keppra, IV magnesium sulfate 2 grams once, IV KCl 10 mEq x 3 runs  Labs reviewed: BUN 51, creatinine 2.93, elevated LFTs (trending down)  UOP: 1015 ml x 24 hours OG tube output: 250 ml x 12 hours I/O's: +1.0 L since admit  NUTRITION - FOCUSED PHYSICAL EXAM:  Unable to complete at this time. RD working remotely.  Diet Order:   Diet Order            Diet NPO time specified  Diet effective now                 EDUCATION NEEDS:   No education needs have been identified at this time  Skin:  Skin Assessment: Reviewed RN Assessment  Last BM:  no documented BM  Height:   Ht Readings from Last 1 Encounters:  08/24/20 6' (1.829 m)    Weight:   Wt Readings from Last 1 Encounters:  08/05/20 107.1 kg    Ideal Body Weight:  80.9 kg  BMI:  Body mass index is 32.02 kg/m.  Estimated Nutritional Needs:   Kcal:  1200-1500  Protein:  162-180 grams  Fluid:  1.8 L/day    Gustavus Bryant, MS, RD, LDN Inpatient Clinical Dietitian Please see AMiON for contact information.

## 2020-08-05 NOTE — Progress Notes (Signed)
Family present, patient extubated at 1520. Comfort care protocols in place. Pressors discontinued. Robinul and Benadryl given, fentanyl infusion titrated according to orders.

## 2020-08-05 NOTE — Progress Notes (Addendum)
ANTICOAGULATION CONSULT NOTE - Initial Consult  Pharmacy Consult for Heparin Indication: atrial fibrillation and pulmonary embolus  Allergies  Allergen Reactions  . Lisinopril Swelling    Angioedema - denies airway complications Tolerates ARBs    Patient Measurements: Height: 6' (182.9 cm) Weight: 107.1 kg (236 lb 1.8 oz) IBW/kg (Calculated) : 77.6 Heparin Dosing Weight: 101 kg  Vital Signs: Temp: 100.76 F (38.2 C) (04/23 1200) Temp Source: Bladder (04/23 0400) BP: 98/52 (04/23 1200) Pulse Rate: 124 (04/23 1114)  Labs: Recent Labs    07/16/2020 0232 08/12/2020 9924 07/20/2020 0653 08/02/2020 1122 07/25/2020 1217 07/20/2020 2202 08/04/20 0429 08/05/20 0435 08/05/20 0826  HGB  --    < > 11.6*   < > 13.4 13.1 13.2 12.7*  --   HCT  --    < > 33.3*   < > 37.1* 35.6* 36.4* 35.4*  --   PLT  --   --  211  --  239 233 221 205  --   APTT  --   --   --   --   --   --   --   --  28  LABPROT  --   --   --   --  23.1*  --   --   --   --   INR  --   --   --   --  2.1*  --   --   --   --   HEPARINUNFRC  --   --   --   --   --   --   --   --  0.51  CREATININE  --   --   --   --  2.85* 3.05* 3.11* 2.93*  --   TROPONINIHS 420*  --  427*  --  483*  --   --   --   --    < > = values in this interval not displayed.    Estimated Creatinine Clearance: 30.9 mL/min (A) (by C-G formula based on SCr of 2.93 mg/dL (H)).   Medical History: Past Medical History:  Diagnosis Date  . Chronic kidney disease   . Hypertension   . Lyme disease   . Myocardial infarction ([place])   . Sleep apnea     Assessment: 65 yo M presents with aortic dissection. Had history of known thoracic aneurysm and atrial fibrillation, on Eliquis PTA (last dose PTA unknown). 4/22 CTA with slightly increased size of aneurysm and an acute PE within R lower lobar pulmonary artery. No right heart strain. Pharmacy asked to start IV heparin.  Baseline aPTT and HL not correlating, heparin level falsely elevated given recent Eliquis.  Will dose based off aPTT until levels correlate. Hgb low 12.7 down from 13s. Pltc 200s. No overt bleeding.    Goal of Therapy:  Heparin level 0.3-0.7 units/ml aPTT 66-102 seconds Monitor platelets by anticoagulation protocol: Yes   Plan:  Start IV heparin 1700 units/hr (no bolus for now) Check 8-hr aPTT Monitor daily HL/aPTT, CBC, s/sx bleeding  Richardine Service, PharmD, BCPS PGY2 Cardiology Pharmacy Resident Phone: [phone] 08/05/2020  1:21 PM  Please check [email or web address] for unit-specific pharmacy phone numbers.

## 2020-08-05 NOTE — Progress Notes (Signed)
Patient placed in CPAP 5 / PS 5 with no patient effort.  Once family was ready, patient extubated to room air. Family and RN at bedside.

## 2020-08-05 NOTE — Progress Notes (Addendum)
Patient ID: Logan Robertson, male   DOB: Dec 07, 1955, 65 y.o.   MRN: [id number]     Advanced Heart Failure Rounding Note  PCP-Cardiologist: Buford Dresser, MD   Subjective:    CT Surgery - consulted for suspected dissection. Not felt to be surgical candidate.   Seizure yesterday, head CT negative for acute changes.  Repeat CTA chest 4/22: 5.6 cm ascending aorta but bolus poorly timed for dissection rule out; small RLL PE, multifocal PNA.   Febrile to 102, on vancomycin/cefepime.   Co-ox 68% on dobutamine 2.5 mcg/kg/min.  Creatinine 3.11 => 2.93, UOP >1000 cc.  CVP 8-9 this morning. LFTs falling.   Has not woken up yet with purposeful interaction.    Objective:   Weight Range: 107.1 kg Body mass index is 32.02 kg/m.   Vital Signs:   Temp:  [99.7 F (37.6 C)-102.56 F (39.2 C)] 102.2 F (39 C) (04/23 0700) Pulse Rate:  [106-141] 111 (04/23 0400) Resp:  [11-22] 20 (04/23 0400) BP: (75-152)/(50-126) 133/91 (04/23 0700) SpO2:  [97 %-100 %] 100 % (04/23 0645) Arterial Line BP: (96-153)/(54-94) 133/77 (04/23 0700) FiO2 (%):  [40 %] 40 % (04/23 0400) Weight:  [107.1 kg] 107.1 kg (04/23 0459) Last BM Date:  (PTA)  Weight change: Filed Weights   08/05/2020 0051 08/04/20 0451 08/05/20 0459  Weight: 111 kg 108.1 kg 107.1 kg    Intake/Output:   Intake/Output Summary (Last 24 hours) at 08/05/2020 0731 Last data filed at 08/05/2020 0700 Gross per 24 hour  Intake 1777.54 ml  Output 1265 ml  Net 512.54 ml      Physical Exam    General: Intubated/sedated.  Neck: Thick. No JVD, no thyromegaly or thyroid nodule.  Lungs: Crackles dependently.  CV: Nondisplaced PMI.  Heart mildly tachy, irregular S1/S2, no S3/S4, no murmur.  No peripheral edema.   Abdomen: Soft, nontender, no hepatosplenomegaly, no distention.  Skin: Intact without lesions or rashes.  Neurologic: Sedated.  Extremities: No clubbing or cyanosis.  HEENT: Normal.    Telemetry  A flutter 110 with  occasional PVCs (personally reviewed)  EKG    n/a  Labs    CBC Recent Labs    07/21/2020 0100 07/17/2020 0647 08/04/20 0429 08/05/20 0435  WBC 11.1*   < > 9.7 9.4  NEUTROABS 9.0*  --   --   --   HGB 13.6   < > 13.2 12.7*  HCT 37.9*   < > 36.4* 35.4*  MCV 91.8   < > 89.9 91.5  PLT 238   < > 221 205   < > = values in this interval not displayed.   Basic Metabolic Panel Recent Labs    07/23/2020 0232 08/04/2020 0647 08/04/20 0429 08/05/20 0435  NA  --    < > 138 139  K  --    < > 3.3* 3.6  CL  --    < > 104 106  CO2  --    < > 20* 21*  GLUCOSE  --    < > 89 92  BUN  --    < > 58* 51*  CREATININE  --    < > 3.11* 2.93*  CALCIUM  --    < > 8.7* 8.4*  MG 2.3  --   --  1.9   < > = values in this interval not displayed.   Liver Function Tests Recent Labs    08/04/20 0429 08/05/20 0435  AST 1,859* 578*  ALT 1,234* 1,050*  ALKPHOS 40 41  BILITOT 1.7* 2.4*  PROT 6.1* 5.7*  ALBUMIN 2.6* 2.4*   No results for input(s): LIPASE, AMYLASE in the last 72 hours. Cardiac Enzymes No results for input(s): CKTOTAL, CKMB, CKMBINDEX, TROPONINI in the last 72 hours.  BNP: BNP (last 3 results) Recent Labs    07/24/2020 0100 08/06/2020 2202 08/04/20 0429  BNP 2,066.0* 2,329.1* 1,272.1*    ProBNP (last 3 results) No results for input(s): PROBNP in the last 8760 hours.   D-Dimer No results for input(s): DDIMER in the last 72 hours. Hemoglobin A1C No results for input(s): HGBA1C in the last 72 hours. Fasting Lipid Panel Recent Labs    07/29/2020 0653 08/05/20 0435  CHOL 129  --   HDL 38*  --   LDLCALC 79  --   TRIG 60 144  CHOLHDL 3.4  --    Thyroid Function Tests No results for input(s): TSH, T4TOTAL, T3FREE, THYROIDAB in the last 72 hours.  Invalid input(s): FREET3  Other results:   Imaging    CT HEAD WO CONTRAST  Result Date: 08/04/2020 CLINICAL DATA:  Anoxic brain damage. EXAM: CT HEAD WITHOUT CONTRAST TECHNIQUE: Contiguous axial images were obtained from the  base of the skull through the vertex without intravenous contrast. COMPARISON:  CT head 08/06/2020 FINDINGS: Brain: Generalized atrophy and mild ventricular enlargement unchanged. No effacement of the sulci no brain edema identified. Chronic cortically based hypodensity in the right occipital parietal lobe unchanged consistent with chronic infarction. Chronic ischemia in the deep white matter on the right. Negative for acute infarct, hemorrhage, mass. Vascular: Negative for hyperdense vessel. Atherosclerotic calcification in the carotid artery bilaterally. Skull: Negative Sinuses/Orbits: Mild mucosal edema paranasal sinuses. No orbital lesion. Other: None IMPRESSION: No acute abnormality no change from yesterday. No evidence of brain edema, acute infarct, hemorrhage. Electronically Signed   By: Franchot Gallo M.D.   On: 08/04/2020 15:45   DG CHEST PORT 1 VIEW  Result Date: 08/04/2020 CLINICAL DATA:  Intubation, CHF, MI, hypertension EXAM: PORTABLE CHEST 1 VIEW COMPARISON:  Portable exam 0920 hours compared to 08/04/2020 FINDINGS: Tip of endotracheal tube projects 3.6 cm above carina. Nasogastric tube extends into stomach. RIGHT subclavian line with tip projecting over SVC. External pacing leads present. Enlargement of cardiac silhouette. Mediastinal contours and pulmonary vascularity normal. Bibasilar atelectasis. No infiltrate, pleural effusion or pneumothorax. IMPRESSION: Enlargement of cardiac silhouette with bibasilar atelectasis. Electronically Signed   By: Lavonia Dana M.D.   On: 08/04/2020 10:54   CT Angio Chest/Abd/Pel for Dissection W and/or W/WO  Result Date: 08/04/2020 CLINICAL DATA:  Known thoracic aortic disease, preoperative planning EXAM: CT ANGIOGRAPHY CHEST, ABDOMEN AND PELVIS TECHNIQUE: Non-contrast CT of the chest was initially obtained. Multidetector CT imaging through the chest, abdomen and pelvis was performed using the standard protocol during bolus administration of intravenous  contrast. Multiplanar reconstructed images and MIPs were obtained and reviewed to evaluate the vascular anatomy. CONTRAST:  169m OMNIPAQUE IOHEXOL 350 MG/ML SOLN COMPARISON:  CT August 03, 2020. FINDINGS: CTA CHEST FINDINGS Cardiovascular: No evidence of intramural hematoma in the thoracic aorta on precontrast views. Slightly increased size of the ascending aortic aneurysm measuring 5.6 cm on image 61/8 previously 5.4 cm. The aortic arch measures 3.3 cm in maximum diameter. The descending aorta measures 3.3 cm in maximum diameter. No evidence of aortic dissection. Right upper central venous catheter with tip in the superior cavoatrial junction. Low-density filling defects within the right lower lobe are pulmonary artery extending into the segmental branches on image  66-68/7. The right ventricular to left ventricular ratio is 0.74. Unchanged cardiac enlargement no significant pericardial effusion/thickening. Mediastinum/Nodes: No discrete thyroid nodules. Endotracheal tube with tip overlying the distal thoracic trachea. Enteric tube with tip in the stomach. No pathologically enlarged mediastinal, hilar or axillary lymph nodes. Lungs/Pleura: Similar small bilateral pleural effusions. Left greater than right lower lobe airspace consolidation. Left upper lobe airspace opacity with multiple small adjacent patchy areas of airspace opacification. No pneumothorax. Musculoskeletal: Diffuse idiopathic skeletal hyperostosis. Review of the MIP images confirms the above findings. CTA ABDOMEN AND PELVIS FINDINGS VASCULAR Aorta: Aortic atherosclerosis. Normal caliber aorta without aneurysm, dissection, vasculitis or significant stenosis. Celiac: Patent without evidence of aneurysm, dissection, vasculitis or significant stenosis. SMA: Patent without evidence of aneurysm, dissection, vasculitis or significant stenosis. Renals: Both renal arteries are patent without evidence of aneurysm, dissection, vasculitis, fibromuscular  dysplasia or significant stenosis. IMA: Patent without evidence of aneurysm, dissection, vasculitis or significant stenosis. Inflow: Patent without evidence of aneurysm, dissection, vasculitis or significant stenosis. Veins: No obvious venous abnormality within the limitations of this arterial phase study. Review of the MIP images confirms the above findings. NON-VASCULAR Hepatobiliary: No suspicious hepatic lesions. Gallbladder is again mildly distended now with trace pericholecystic fluid. No biliary ductal dilation. Pancreas: Unremarkable. No pancreatic ductal dilatation or surrounding inflammatory changes. Spleen: Normal in size without focal abnormality. Adrenals/Urinary Tract: Bilateral adrenal glands are unremarkable. No hydronephrosis. Right kidneys unremarkable. Hypodense 3.3 cm left lower pole renal cyst. 1.7 cm interpolar left renal cyst. Urinary bladder is decompressed around Foley catheter limiting evaluation. Stomach/Bowel: Enteric tube in the stomach otherwise stomach is grossly unremarkable. No suspicious small bowel wall thickening or dilation. Normal appendix. No suspicious colonic wall thickening or mass like lesions. Lymphatic: No gastrohepatic or hepatoduodenal ligament lymphadenopathy. No retroperitoneal or mesenteric lymphadenopathy. No pelvic sidewall lymphadenopathy. No groin lymphadenopathy. Reproductive: Prostate is unremarkable. Other: Trace pericholecystic and pelvic free fluid. Musculoskeletal: Mild lumbar spondylosis. No acute osseous abnormality. Review of the MIP images confirms the above findings. IMPRESSION: 1. Slightly increased size of the ascending aortic aneurysm measuring 5.6 cm previously 5.4 cm. No evidence of aortic dissection. 2. Acute pulmonary emboli within the right lower lobar pulmonary artery extending into the segmental branches without evidence of right heart strain. 3. Similar left greater than right lower lobe airspace consolidation as well as left upper lobe  airspace opacity with multiple small adjacent patchy areas of airspace opacification, suspicious for multifocal pneumonia. 4. Similar small bilateral pleural effusions. 5. Mildly distended gallbladder with trace pericholecystic fluid, nonspecific but can be seen in the setting acalculous cholecystitis. Recommend correlation with right upper quadrant ultrasound or nuclear medicine HIDA scan. Critical Value/emergent results were called by telephone at the time of interpretation on 08/04/2020 at 3:56 pm to provider [facility] , who verbally acknowledged these results. Electronically Signed   By: Dahlia Bailiff MD   On: 08/04/2020 16:01     Medications:     Scheduled Medications: . atorvastatin  80 mg Per Tube Daily  . chlorhexidine gluconate (MEDLINE KIT)  15 mL Mouth Rinse BID  . Chlorhexidine Gluconate Cloth  6 each Topical Daily  . docusate  100 mg Per Tube BID  . furosemide  60 mg Intravenous Once  . mouth rinse  15 mL Mouth Rinse 10 times per day  . pantoprazole (PROTONIX) IV  40 mg Intravenous Daily  . polyethylene glycol  17 g Per Tube Daily    Infusions: . sodium chloride    . ceFEPime (MAXIPIME) IV  Stopped (08/04/20 1104)  . dexmedetomidine (PRECEDEX) IV infusion 1.2 mcg/kg/hr (08/05/20 0700)  . DOBUTamine 2.5 mcg/kg/min (08/05/20 0700)  . fentaNYL infusion INTRAVENOUS 200 mcg/hr (08/05/20 0700)  . norepinephrine (LEVOPHED) Adult infusion Stopped (08/04/20 1241)  . potassium chloride    . propofol (DIPRIVAN) infusion 10 mcg/kg/min (08/05/20 0700)  . [START ON 08/06/2020] vancomycin      PRN Medications: acetaminophen, albuterol, fentaNYL, ondansetron (ZOFRAN) IV     Assessment/Plan   1. Acute aortic syndrome: Patient had known 5.4 cm ascending aortic aneurysm with mild-moderate AI.  CT and TEE initially both showed abnormality involving distal ascending aorta through the arch to the descending aorta.  CTA was not clear but concerning for early dissection.  TEE was  concerning for dissection flap starting distal ascending aorta. I discussed the patient with Drs Tami Ribas, and Margaretann Loveless and he was not a surgical candidate for repair of his ascending aorta given severe biventricular failure/cardiogenic shock and AKI with anuria.  However, patient has stabilized since then.  Repeat CTA 4/22 to try to better define the ascending aorta/arch abnormality showed 5.6 cm ascending aorta but bolus not timed well for dissection identification.  - If patient wakes up/shows purposeful response today, will repeat TEE in am to reassess for dissection (?repair if patient wakes up).  2. Cardiogenic shock: Baseline nonischemic cardiomyopathy with normal coronaries in 6/21, prior echo with EF 35-40%. Echo this admission with EF 15%, moderate RV dysfunction, mild-moderate AI.  Mechanical support not used with concern for dissection. Patient is now off norepinephrine and on dobutamine 2.5 with co-ox up to 68% and normalized lactate.  CVP 8-9.  Creatinine actually trending down and he is making urine.  - Continue dobutamine 2.5.  - Lasix 60 mg IV x 1 today.   3. Atrial flutter: Chronic, failed DCCV last week as outpatient.  Rate mildly elevated in 110s on dobutamine.   - Eliquis on hold.  - Hold off on amiodarone for now unless HR gets faster.   4. AKI: Creatinine up to 2.5 recently, possible contrast nephropathy post-CTA on 4/11.  Creatinine trending up 3.1 but despite contrast here with CTAs, now down to 2.93 today with good UOP. 5. Elevated LFTs: Shock liver associate with cardiogenic shock.  LFTs trending down.  6. ID: CT chest with multifocal PNA.  Cultures pending.  - Cefepime/vancomycin. - Send procalcitonin.  7. Acute hypoxemic respiratory failure: Suspect PNA + CHF.  Vent per CCM.  8. Neuro: Seizure yesterday, concern for anoxic encephalopathy from initial shock.  Patient has yet to wake up.   - Wean sedation, attempt to awaken today.  9. PE: Small PE noted on CTA chest  4/22.   - Discuss with CCM, anticoagulation would be risky with ongoing concern for dissection.    CRITICAL CARE Performed by: Loralie Champagne  Total critical care time: 40 minutes  Critical care time was exclusive of separately billable procedures and treating other patients.  Critical care was necessary to treat or prevent imminent or life-threatening deterioration.  Critical care was time spent personally by me on the following activities: development of treatment plan with patient and/or surrogate as well as nursing, discussions with consultants, evaluation of patient's response to treatment, examination of patient, obtaining history from patient or surrogate, ordering and performing treatments and interventions, ordering and review of laboratory studies, ordering and review of radiographic studies, pulse oximetry and re-evaluation of patient's condition.  Loralie Champagne 08/05/2020 7:31 AM

## 2020-08-05 NOTE — Progress Notes (Signed)
Still seizure-type activity with sedation wean, no purposeful movement, some flailing.  All family at bedside, would like to allow Logan Robertson to pass in peace off life support.  Erskine Emery MD PCCM

## 2020-08-06 DIAGNOSIS — I4892 Unspecified atrial flutter: Secondary | ICD-10-CM | POA: Diagnosis not present

## 2020-08-06 LAB — CULTURE, RESPIRATORY W GRAM STAIN: Culture: NORMAL

## 2020-08-06 MED ORDER — LORAZEPAM 2 MG/ML IJ SOLN
2.0000 mg | INTRAMUSCULAR | Status: DC | PRN
  Administered 2020-08-06 (×5): 2 mg via INTRAVENOUS
  Filled 2020-08-06 (×5): qty 1

## 2020-08-06 MED ORDER — GLYCOPYRROLATE 0.2 MG/ML IJ SOLN
0.4000 mg | INTRAMUSCULAR | Status: DC | PRN
  Administered 2020-08-06 – 2020-08-08 (×8): 0.4 mg via INTRAVENOUS
  Filled 2020-08-06 (×8): qty 2

## 2020-08-06 NOTE — Plan of Care (Signed)
  Problem: Clinical Measurements: Goal: Respiratory complications will improve Outcome: Progressing Goal: Cardiovascular complication will be avoided Outcome: Progressing   Problem: Elimination: Goal: Will not experience complications related to urinary retention Outcome: Progressing   Problem: Pain Managment: Goal: General experience of comfort will improve Outcome: Progressing   Problem: Safety: Goal: Ability to remain free from injury will improve Outcome: Progressing   Problem: Skin Integrity: Goal: Risk for impaired skin integrity will decrease Outcome: Progressing   

## 2020-08-06 NOTE — Progress Notes (Signed)
Fentanyl infusion bag empty. New fentanyl infusion bag admin. Will continue to monitor.

## 2020-08-06 NOTE — Progress Notes (Signed)
08/06/2020   I have seen and evaluated the patient for comfort measures after aortic dissection.  S:  Compassionately extubated yesterday. Unresponsive.  O: Blood pressure 96/72, pulse (!) 54, temperature 99.8 F (37.7 C), resp. rate (!) 7, height 6' (1.829 m), weight 107.1 kg, SpO2 91 %.  Comatose man with rattling breath sounds Ext lukewarm Sats 77% Appears comfortable  A:  End of life care after inoperable ascending aortic dissection  P:  Continue opiates titrated to comfort In hospital death expected Okay to transfer to 6N for ongoing care, family updated  Erskine Emery MD [facility] [place] epic messenger for cross cover needs If after hours, please call E-link

## 2020-08-06 NOTE — Progress Notes (Signed)
Pt arrived to room 6N03 via bed from [place]. Received report from [place], Therapist, sports . See assessment. Will continue to monitor.

## 2020-08-07 DIAGNOSIS — I7103 Dissection of thoracoabdominal aorta: Secondary | ICD-10-CM

## 2020-08-07 DIAGNOSIS — Z515 Encounter for palliative care: Secondary | ICD-10-CM

## 2020-08-07 NOTE — Progress Notes (Signed)
08/07/2020   I have seen and evaluated the patient for comfort measures after aortic dissection.  S:  Compassionately extubated over the weekend. Appears comfortable. Not responsive.  Family at bedside - had questions about titration of fentanyl.   O: Blood pressure (!) 124/95, pulse (!) 50, temperature (!) 102.6 F (39.2 C), temperature source Axillary, resp. rate 19, height 6' (1.829 m), weight 107.1 kg, SpO2 (!) 77 %. (vitals taken 4/24 morning, vitals no longer being measured.) Resting comfortably, light snoring, no respiratory distress Ext lukewarm  A:  End of life care after inoperable ascending aortic dissection  P:  Continue opiates titrated to comfort - currently on fentanyl gtt at 350 mcg with good effect.  Continue prn ativan, for anxiety.  Family's questions answered, needs met.  In hospital death expected  Lenice Llamas, MD Pulmonary and [place]

## 2020-08-07 NOTE — Progress Notes (Signed)
Vitals were taken at 2040 per family's request. Temperature taken at 2011 and rectal tylenol given.

## 2020-08-07 NOTE — Progress Notes (Signed)
Now comfort care.   Heart failure team will sign off as of 08/07/20   Cinthia Rodden NP-C  7:30 AM

## 2020-08-07 NOTE — Progress Notes (Signed)
33ml of Fentanyl wasted at this time. Witnessed by [employer].

## 2020-08-07 NOTE — Care Management Important Message (Signed)
Important Message  Patient Details  Name: Logan Robertson MRN: [id number] Date of Birth: 1955/12/09   Medicare Important Message Given:  Yes     [name] 08/07/2020, 3:36 PM

## 2020-08-07 NOTE — Telephone Encounter (Signed)
Patient currently in hospital.   Routed to primary nurse as Juluis Rainier

## 2020-08-07 NOTE — Progress Notes (Signed)
Nutrition Brief Note  Chart reviewed. Pt has transitioned to comfort care. In-Hospital death expected per MD notes No further nutrition interventions planned at this time.  Please re-consult as needed.   Kerman Passey MS, RDN, LDN, CNSC Registered Dietitian III Clinical Nutrition RD Pager and On-Call Pager Number Located in [place]

## 2020-08-09 LAB — CULTURE, BLOOD (ROUTINE X 2)
Culture: NO GROWTH
Culture: NO GROWTH

## 2020-08-10 ENCOUNTER — Ambulatory Visit: Payer: Medicare HMO | Admitting: Cardiothoracic Surgery

## 2020-08-13 NOTE — Progress Notes (Signed)
Family members still grieving and present at bedside at this time, awaiting one last family member.

## 2020-08-13 NOTE — Progress Notes (Signed)
Family medicine residency service to take over patient's care at [place]. Arrived to patient's room at 7:30am to find he had died around 2am per daughter at bedside. No notes in the chart regarding this.   Night nurse had called the CCM attending overnight to fill out death summary.   Ezequiel Essex, MD

## 2020-08-13 NOTE — Progress Notes (Signed)
93 mL of Fentanyl wasted with Oley Balm, RN at this time.

## 2020-08-13 NOTE — Progress Notes (Signed)
Family members still present at bedside at this time.

## 2020-08-13 NOTE — Death Summary Note (Signed)
DEATH SUMMARY   Patient Details  Name: Logan Robertson MRN: [id number] DOB: 1956/04/08  Admission/Discharge Information   Admit Date:  2020-08-25  Date of Death: Date of Death: 2020/08/30  Time of Death: Time of Death: 0241  Length of Stay: 5  Referring Physician: Danna Hefty, DO   Reason(s) for Hospitalization  Aortic dissection  Diagnoses  Preliminary cause of death: Aortic Dissection Secondary Diagnoses (including complications and co-morbidities):  Principal Problem:   Paroxysmal atrial flutter (HCC) Active Problems:   Acute on chronic systolic heart failure (HCC)   Atrial flutter with rapid ventricular response (HCC)   Acute on chronic heart failure ([place]) Goals of care conversations HTN OSA Heart Failure History of stroke Chronic Kidney Disease  Brief Hospital Course (including significant findings, care, treatment, and services provided and events leading to death)  Logan Robertson was a 65 y.o. year old male who with a history of PAF, CKD Stage III, Ascending Thoracic  Aneurysm, hyperlipidemia, HTN, OSA, CVA, and HFrEF. He presented 08/25/2020 with shortness of breath. He had long standing ascending thoracic aneurysm with poorly controlled HTN. He had atrial flutter during an outpatient echo appointment on 4/18 and had a failed cardioversion.  Presented on 08-26-22 with shortness of breath, troponin elevation, elevated lactic acid, reduced ejection fraction leading to diagnosis of dissecting ascending thoracic aneurysm. Emergently intubated and TEE performed. Case discussed with thoracic surgery, cardiology. Was felt that he would not survive surgery for surgical repair. Family decided to make comfort care based on this. He was extubated and transitioned to comfort measures.   Pertinent Labs and Studies  Significant Diagnostic Studies DG Chest 2 View  Result Date: 2020-08-25 CLINICAL DATA:  Chest pain and shortness of breath EXAM: CHEST - 2 VIEW COMPARISON:   12/05/2019 FINDINGS: Cardiomegaly. Normal pleural spaces. No focal airspace consolidation or pulmonary edema. IMPRESSION: Cardiomegaly without focal airspace disease. Electronically Signed   By: Ulyses Jarred M.D.   On: 2020-08-25 01:19   CT HEAD WO CONTRAST  Result Date: 08/04/2020 CLINICAL DATA:  Anoxic brain damage. EXAM: CT HEAD WITHOUT CONTRAST TECHNIQUE: Contiguous axial images were obtained from the base of the skull through the vertex without intravenous contrast. COMPARISON:  CT head 08-25-20 FINDINGS: Brain: Generalized atrophy and mild ventricular enlargement unchanged. No effacement of the sulci no brain edema identified. Chronic cortically based hypodensity in the right occipital parietal lobe unchanged consistent with chronic infarction. Chronic ischemia in the deep white matter on the right. Negative for acute infarct, hemorrhage, mass. Vascular: Negative for hyperdense vessel. Atherosclerotic calcification in the carotid artery bilaterally. Skull: Negative Sinuses/Orbits: Mild mucosal edema paranasal sinuses. No orbital lesion. Other: None IMPRESSION: No acute abnormality no change from yesterday. No evidence of brain edema, acute infarct, hemorrhage. Electronically Signed   By: Franchot Gallo M.D.   On: 08/04/2020 15:45   CT HEAD WO CONTRAST  Result Date: 08/25/20 CLINICAL DATA:  Mental status change. Post code with cardiogenic shock. Left-sided weakness. EXAM: CT HEAD WITHOUT CONTRAST TECHNIQUE: Contiguous axial images were obtained from the base of the skull through the vertex without intravenous contrast. COMPARISON:  None. FINDINGS: Brain: Mild atrophy.  Negative for hydrocephalus Moderately large hypodensity in the right occipital parietal lobe compatible with chronic lobar infarction. Negative for acute infarct, hemorrhage, mass Vascular: Negative for hyperdense vessel Skull: Negative Sinuses/Orbits: Paranasal sinuses clear. NG tube and endotracheal tube in place. Negative orbit.  Other: None IMPRESSION: No acute abnormality. Chronic infarct right occipital parietal lobe. Electronically Signed  By: Franchot Gallo M.D.   On: 08/14/20 09:28   DG CHEST PORT 1 VIEW  Result Date: 08/04/2020 CLINICAL DATA:  Intubation, CHF, MI, hypertension EXAM: PORTABLE CHEST 1 VIEW COMPARISON:  Portable exam 0920 hours compared to Aug 14, 2020 FINDINGS: Tip of endotracheal tube projects 3.6 cm above carina. Nasogastric tube extends into stomach. RIGHT subclavian line with tip projecting over SVC. External pacing leads present. Enlargement of cardiac silhouette. Mediastinal contours and pulmonary vascularity normal. Bibasilar atelectasis. No infiltrate, pleural effusion or pneumothorax. IMPRESSION: Enlargement of cardiac silhouette with bibasilar atelectasis. Electronically Signed   By: Lavonia Dana M.D.   On: 08/04/2020 10:54   DG CHEST PORT 1 VIEW  Result Date: 14-Aug-2020 CLINICAL DATA:  Infection due to triple-lumen catheter. EXAM: PORTABLE CHEST 1 VIEW COMPARISON:  CT 2020/08/14.  Chest x-ray Aug 14, 2020. FINDINGS: Endotracheal tube tip 4 cm above the lower portion of the carina. NG tube tip below left hemidiaphragm. Right subclavian line with tip over SVC. Cardiomegaly. No pulmonary venous congestion. Mild left perihilar infiltrate cannot be excluded. Stable elevation left hemidiaphragm. No prominent pleural effusion. No pneumothorax. IMPRESSION: 1.  Lines and tubes in good anatomic position. 2.  Cardiomegaly.  No pulmonary venous congestion. 3.  Mild left perihilar infiltrate cannot be excluded. Electronically Signed   By: Marcello Moores  Register   On: 14-Aug-2020 12:22   CT ANGIO CHEST AORTA W/CM & OR WO/CM  Result Date: 07/24/2020 CLINICAL DATA:  65 year old male with a history of thoracic aortic aneurysm EXAM: CT ANGIOGRAPHY CHEST WITH CONTRAST TECHNIQUE: Multidetector CT imaging of the chest was performed using the standard protocol during bolus administration of intravenous contrast. Multiplanar CT  image reconstructions and MIPs were obtained to evaluate the vascular anatomy. CONTRAST:  41mL ISOVUE-370 IOPAMIDOL (ISOVUE-370) INJECTION 76% COMPARISON:  No CT comparison FINDINGS: Cardiovascular: Heart: Cardiomegaly. No pericardial fluid/thickening no significant calcifications of the coronary arteries. Aorta: No significant aortic valve calcifications. Greatest estimated diameter of the annulus 29 mm on the coronal reformatted images. Greatest estimated diameter of the sino-tubular junction 37 mm on the coronal reformatted images Greatest estimated diameter of the ascending aorta 5.4 cm on the axial images. Common origin of the innominate artery an the left common carotid artery. Tortuosity of the branch vessels. Branch vessels are patent with minimal atherosclerosis of the left subclavian artery. Cervical cerebral vessels patent at the base of the neck. Dominant right vertebral artery. Minimal atherosclerosis of the descending thoracic aorta. No dissection, pedunculated plaque, ulcerated plaque, periaortic fluid. Pulmonary arteries: Timing of the contrast bolus is not optimized for evaluation of pulmonary artery filling defects. Mediastinum/Nodes: No mediastinal adenopathy. Unremarkable appearance of the thoracic esophagus. Unremarkable appearance of the thoracic inlet. Lungs/Pleura: Mild patchy ground-glass opacities in the upper lungs. No interlobular septal thickening. Single rounded thin walled cyst of the left lower lobe. Minimal atelectasis/scarring of the dependent lung bases. Bronchial wall thickening of the right greater than left upper lobes. No pleural effusion or pneumothorax. Upper Abdomen: No acute finding of the upper abdomen. Low-density lesion within segment 4 B, 15 mm. This is nonspecific given the single phase and incomplete imaging, though most likely a benign biliary cyst. Musculoskeletal: Degenerative changes of the thoracic spine. No bony canal narrowing. Review of the MIP images confirms  the above findings. IMPRESSION: Greatest diameter of the ascending aorta estimated 5.4 cm. Ascending thoracic aortic aneurysm. Recommend semi-annual imaging followup by CTA or MRA and referral to cardiothoracic surgery if not already obtained. This recommendation follows 2010 ACCF/AHA/AATS/ACR/ASA/SCA/SCAI/SIR/STS/SVM Guidelines for the Diagnosis and  Management of Patients With Thoracic Aortic Disease. Circulation. 2010; [id number]. Aortic aneurysm NOS (ICD10-I71.9) Patchy ground-glass opacity of the upper lobes with right greater than left bronchial wall thickening in the upper lobes, favored to be sequela of bronchitis/bronchiolitis. Aortic Atherosclerosis (ICD10-I70.0). Signed, Dulcy Fanny. Dellia Nims, RPVI [facility] Electronically Signed   By: Corrie Mckusick D.O.   On: 07/24/2020 13:25   ECHOCARDIOGRAM COMPLETE  Result Date: 07/31/2020    ECHOCARDIOGRAM REPORT   Patient Name:   AVYN HENNESY Date of Exam: 07/14/2020 Medical Rec #:  [id number]        Height:       72.0 in Accession #:    [id number]       Weight:       244.7 lb Date of Birth:  [birth year]        BSA:          2.321 m Patient Age:    15 years         BP:           63/52 mmHg Patient Gender: M                HR:           83 bpm. Exam Location:  Inpatient Procedure: 2D Echo, Cardiac Doppler, Color Doppler and Intracardiac            Opacification Agent Indications:    Dyspnea  History:        Patient has prior history of Echocardiogram examinations, most                 recent 09/29/2019. Previous Myocardial Infarction; Risk                 Factors:Hypertension.  Sonographer:    [place] Referring Phys: [id number] Darreld Mclean  Sonographer Comments: Patient is morbidly obese. 09/30/19 cath 07/31/20 cardioversion IMPRESSIONS  1. Left ventricular ejection fraction, by estimation, is 15%. The left ventricle has severely decreased function. The left ventricle demonstrates global  hypokinesis. The left ventricular internal cavity size was moderately dilated. Left ventricular diastolic parameters are indeterminate.  2. Right ventricular systolic function is normal. The right ventricular size is normal. There is mildly elevated pulmonary artery systolic pressure.  3. Left atrial size was mildly dilated.  4. The mitral valve is normal in structure. Mild mitral valve regurgitation. No evidence of mitral stenosis.  5. Tricuspid valve regurgitation is severe.  6. The aortic valve is normal in structure. Aortic valve regurgitation is mild. No aortic stenosis is present.  7. Consider f/u CTA to evaluate aorta . Aortic dilatation noted. There is severe dilatation of the ascending aorta, measuring 54 mm.  8. The inferior vena cava is normal in size with greater than 50% respiratory variability, suggesting right atrial pressure of 3 mmHg. FINDINGS  Left Ventricle: Left ventricular ejection fraction, by estimation, is 15%. The left ventricle has severely decreased function. The left ventricle demonstrates global hypokinesis. Definity contrast agent was given IV to delineate the left ventricular endocardial borders. The left ventricular internal cavity size was moderately dilated. There is no left ventricular hypertrophy. Left ventricular diastolic parameters are indeterminate. Right Ventricle: The right ventricular size is normal. No increase in right ventricular wall thickness. Right ventricular systolic function is normal. There is mildly elevated pulmonary artery systolic pressure. The tricuspid regurgitant velocity is 2.69  m/s, and with an assumed right atrial pressure of 8 mmHg, the  estimated right ventricular systolic pressure is 70.3 mmHg. Left Atrium: Left atrial size was mildly dilated. Right Atrium: Right atrial size was normal in size. Pericardium: There is no evidence of pericardial effusion. Mitral Valve: The mitral valve is normal in structure. Mild mitral valve regurgitation. No evidence  of mitral valve stenosis. Tricuspid Valve: The tricuspid valve is normal in structure. Tricuspid valve regurgitation is severe. No evidence of tricuspid stenosis. Aortic Valve: The aortic valve is normal in structure. Aortic valve regurgitation is mild. Aortic regurgitation PHT measures 2242 msec. No aortic stenosis is present. Aortic valve mean gradient measures 1.5 mmHg. Aortic valve peak gradient measures 2.3 mmHg. Aortic valve area, by VTI measures 3.20 cm. Pulmonic Valve: The pulmonic valve was normal in structure. Pulmonic valve regurgitation is mild. No evidence of pulmonic stenosis. Aorta: Consider f/u CTA to evaluate aorta. The aortic root is normal in size and structure and aortic dilatation noted. There is severe dilatation of the ascending aorta, measuring 54 mm. Venous: The inferior vena cava is normal in size with greater than 50% respiratory variability, suggesting right atrial pressure of 3 mmHg. IAS/Shunts: No atrial level shunt detected by color flow Doppler.  LEFT VENTRICLE PLAX 2D LVIDd:         5.60 cm      Diastology LVIDs:         5.00 cm      LV e' medial:    3.26 cm/s LV PW:         1.50 cm      LV E/e' medial:  27.5 LV IVS:        1.00 cm      LV e' lateral:   5.12 cm/s LVOT diam:     2.30 cm      LV E/e' lateral: 17.5 LV SV:         35 LV SV Index:   15 LVOT Area:     4.15 cm  LV Volumes (MOD) LV vol d, MOD A4C: 175.0 ml LV vol s, MOD A4C: 174.0 ml LV SV MOD A4C:     175.0 ml RIGHT VENTRICLE RV S prime:     9.81 cm/s TAPSE (M-mode): 1.3 cm LEFT ATRIUM              Index       RIGHT ATRIUM           Index LA Vol (A2C):   136.0 ml 58.59 ml/m RA Area:     25.70 cm LA Vol (A4C):   106.0 ml 45.66 ml/m RA Volume:   87.80 ml  37.82 ml/m LA Biplane Vol: 121.0 ml 52.13 ml/m  AORTIC VALVE                   PULMONIC VALVE AV Area (Vmax):    3.39 cm    PV Vmax:       0.58 m/s AV Area (Vmean):   3.20 cm    PV Vmean:      43.100 cm/s AV Area (VTI):     3.20 cm    PV VTI:        0.118 m AV  Vmax:           75.30 cm/s  PV Peak grad:  1.3 mmHg AV Vmean:          49.200 cm/s PV Mean grad:  1.0 mmHg AV VTI:            0.108 m AV Peak Grad:  2.3 mmHg AV Mean Grad:      1.5 mmHg LVOT Vmax:         61.35 cm/s LVOT Vmean:        37.900 cm/s LVOT VTI:          0.083 m LVOT/AV VTI ratio: 0.77 AI PHT:            2242 msec  AORTA Ao Root diam: 4.10 cm Ao Asc diam:  5.10 cm MITRAL VALVE                 TRICUSPID VALVE MV Area (PHT): 4.06 cm      TR Peak grad:   28.9 mmHg MV Decel Time: 187 msec      TR Vmax:        269.00 cm/s MR Peak grad:    54.5 mmHg MR Mean grad:    35.0 mmHg   SHUNTS MR Vmax:         369.00 cm/s Systemic VTI:  0.08 m MR Vmean:        275.0 cm/s  Systemic Diam: 2.30 cm MR PISA:         0.57 cm MR PISA Eff ROA: 3 mm MR PISA Radius:  0.30 cm MV E velocity: 89.50 cm/s MV A velocity: 22.30 cm/s MV E/A ratio:  4.01 Jenkins Rouge MD Electronically signed by Jenkins Rouge MD Signature Date/Time: 07/19/2020/8:53:49 AM    Final    ECHO TEE  Result Date: 2020/09/07    TRANSESOPHOGEAL ECHO REPORT   Patient Name:   Logan Robertson Date of Exam: 07/31/2020 Medical Rec #:  [id number]        Height:       72.0 in Accession #:    [id number]       Weight:       244.7 lb Date of Birth:  Jul 04, 1955        BSA:          2.321 m Patient Age:    64 years         BP:           111/37 mmHg Patient Gender: M                HR:           64 bpm. Exam Location:  Inpatient Procedure: Transesophageal Echo, 3D Echo and Color Doppler Indications:     Aortic dissection  History:         Patient has prior history of Echocardiogram examinations, most                  recent 07/19/2020. CHF and Cardiomyopathy, Previous Myocardial                  Infarction and CAD, Abnormal ECG, Stroke, Arrythmias:Atrial                  Flutter, Signs/Symptoms:Shortness of Breath and Dyspnea; Risk                  Factors:Dyslipidemia. Ascending aortic aneurysm.  Sonographer:     Roseanna Rainbow RDCS Referring Phys:  [id number] Elouise Munroe  Diagnosing Phys: Cherlynn Kaiser MD PROCEDURE: After discussion of the risks and benefits of a TEE, an informed consent was obtained from a family member. The patient was intubated. TEE procedure time was 51 minutes. The transesophogeal probe was passed without difficulty through the esophogus of the patient. Imaged were obtained with the patient  in a supine position. Sedation performed by different physician. The patient was monitored while under deep sedation. The patient's vital signs; including heart rate, blood pressure, and oxygen saturation; remained stable throughout the procedure. Supplementary images were obtained from transthoracic windows as indicated to answer the clinical question. The patient developed no complications during the procedure. Patient on continuous profofol sedation. Supplemental images from Image [id number] performed and obtained by Dr. Loralie Champagne. IMPRESSIONS  1. Suspect Type A ascending aorta dissection. Origin is at least at the proximal ascending aorta, though clip 100 suggests origin may be at level of sinus of Valsalva.  2. Ascending aortic aneurysm at mid ascending aorta measuring 57 mm.  3. There is sludge in the descending thoracic aorta, concerning for stasis vs false lumen - pulsatile movement of this region suggests may represent thrombosed portion of the lumen.  4. Left ventricular ejection fraction, by estimation, is 15%.  5. The aortic valve is grossly normal. Aortic valve regurgitation is mild to moderate.  6. Aortic dilatation noted. FINDINGS  Left Ventricle: Left ventricular ejection fraction, by estimation, is 15%. Aortic Valve: The aortic valve is grossly normal. Aortic valve regurgitation is mild to moderate. Aorta: Aortic dilatation noted. Cherlynn Kaiser MD Electronically signed by Cherlynn Kaiser MD Signature Date/Time: 18-Aug-2020/9:06:20 PM    Final    CT Angio Chest/Abd/Pel for Dissection W and/or W/WO  Result Date: 08/04/2020 CLINICAL DATA:  Known thoracic  aortic disease, preoperative planning EXAM: CT ANGIOGRAPHY CHEST, ABDOMEN AND PELVIS TECHNIQUE: Non-contrast CT of the chest was initially obtained. Multidetector CT imaging through the chest, abdomen and pelvis was performed using the standard protocol during bolus administration of intravenous contrast. Multiplanar reconstructed images and MIPs were obtained and reviewed to evaluate the vascular anatomy. CONTRAST:  148mL OMNIPAQUE IOHEXOL 350 MG/ML SOLN COMPARISON:  CT August 03, 2020. FINDINGS: CTA CHEST FINDINGS Cardiovascular: No evidence of intramural hematoma in the thoracic aorta on precontrast views. Slightly increased size of the ascending aortic aneurysm measuring 5.6 cm on image 61/8 previously 5.4 cm. The aortic arch measures 3.3 cm in maximum diameter. The descending aorta measures 3.3 cm in maximum diameter. No evidence of aortic dissection. Right upper central venous catheter with tip in the superior cavoatrial junction. Low-density filling defects within the right lower lobe are pulmonary artery extending into the segmental branches on image 66-68/7. The right ventricular to left ventricular ratio is 0.74. Unchanged cardiac enlargement no significant pericardial effusion/thickening. Mediastinum/Nodes: No discrete thyroid nodules. Endotracheal tube with tip overlying the distal thoracic trachea. Enteric tube with tip in the stomach. No pathologically enlarged mediastinal, hilar or axillary lymph nodes. Lungs/Pleura: Similar small bilateral pleural effusions. Left greater than right lower lobe airspace consolidation. Left upper lobe airspace opacity with multiple small adjacent patchy areas of airspace opacification. No pneumothorax. Musculoskeletal: Diffuse idiopathic skeletal hyperostosis. Review of the MIP images confirms the above findings. CTA ABDOMEN AND PELVIS FINDINGS VASCULAR Aorta: Aortic atherosclerosis. Normal caliber aorta without aneurysm, dissection, vasculitis or significant stenosis.  Celiac: Patent without evidence of aneurysm, dissection, vasculitis or significant stenosis. SMA: Patent without evidence of aneurysm, dissection, vasculitis or significant stenosis. Renals: Both renal arteries are patent without evidence of aneurysm, dissection, vasculitis, fibromuscular dysplasia or significant stenosis. IMA: Patent without evidence of aneurysm, dissection, vasculitis or significant stenosis. Inflow: Patent without evidence of aneurysm, dissection, vasculitis or significant stenosis. Veins: No obvious venous abnormality within the limitations of this arterial phase study. Review of the MIP images confirms the above findings. NON-VASCULAR Hepatobiliary: No suspicious hepatic  lesions. Gallbladder is again mildly distended now with trace pericholecystic fluid. No biliary ductal dilation. Pancreas: Unremarkable. No pancreatic ductal dilatation or surrounding inflammatory changes. Spleen: Normal in size without focal abnormality. Adrenals/Urinary Tract: Bilateral adrenal glands are unremarkable. No hydronephrosis. Right kidneys unremarkable. Hypodense 3.3 cm left lower pole renal cyst. 1.7 cm interpolar left renal cyst. Urinary bladder is decompressed around Foley catheter limiting evaluation. Stomach/Bowel: Enteric tube in the stomach otherwise stomach is grossly unremarkable. No suspicious small bowel wall thickening or dilation. Normal appendix. No suspicious colonic wall thickening or mass like lesions. Lymphatic: No gastrohepatic or hepatoduodenal ligament lymphadenopathy. No retroperitoneal or mesenteric lymphadenopathy. No pelvic sidewall lymphadenopathy. No groin lymphadenopathy. Reproductive: Prostate is unremarkable. Other: Trace pericholecystic and pelvic free fluid. Musculoskeletal: Mild lumbar spondylosis. No acute osseous abnormality. Review of the MIP images confirms the above findings. IMPRESSION: 1. Slightly increased size of the ascending aortic aneurysm measuring 5.6 cm previously  5.4 cm. No evidence of aortic dissection. 2. Acute pulmonary emboli within the right lower lobar pulmonary artery extending into the segmental branches without evidence of right heart strain. 3. Similar left greater than right lower lobe airspace consolidation as well as left upper lobe airspace opacity with multiple small adjacent patchy areas of airspace opacification, suspicious for multifocal pneumonia. 4. Similar small bilateral pleural effusions. 5. Mildly distended gallbladder with trace pericholecystic fluid, nonspecific but can be seen in the setting acalculous cholecystitis. Recommend correlation with right upper quadrant ultrasound or nuclear medicine HIDA scan. Critical Value/emergent results were called by telephone at the time of interpretation on 08/04/2020 at 3:56 pm to provider [facility] , who verbally acknowledged these results. Electronically Signed   By: Dahlia Bailiff MD   On: 08/04/2020 16:01   CT Angio Chest/Abd/Pel for Dissection W and/or W/WO  Result Date: 07/17/2020 CLINICAL DATA:  Known thoracic aortic aneurysm assess for complications/dissection. EXAM: CT ANGIOGRAPHY CHEST, ABDOMEN AND PELVIS TECHNIQUE: Non-contrast CT of the chest was initially obtained. Multidetector CT imaging through the chest, abdomen and pelvis was performed using the standard protocol during bolus administration of intravenous contrast. Multiplanar reconstructed images and MIPs were obtained and reviewed to evaluate the vascular anatomy. CONTRAST:  112mL OMNIPAQUE IOHEXOL 350 MG/ML SOLN COMPARISON:  CT chest July 24, 2020. FINDINGS: CTA CHEST FINDINGS Cardiovascular: No evidence of intramural hematoma on precontrast imaging. Unchanged size of the ascending aortic aneurysm measuring 5.4 cm on image 61/7. At the aortic arch on postcontrast imaging there is some hypodense fluid beginning at the proximal aortic arch and extending into the proximal descending aorta without a discrete luminal flap visualized, this  is favored to represent mixing artifact due to additional contrast bolus related to patient's port cardiac output. However, given that the proximal aspect of the defect is sharper and more distinct than would be expected for this type of artifact an early dissection flap is not completely excluded if continued clinical concern for acute aortic syndrome consider further evaluation with cardiac ECHO. Reflux of contrast into the IVC and hepatic veins. Aortic atherosclerosis. Cardiomegaly. No suspicious intracardiac filling defects. No significant pericardial effusion/thickening. Mediastinum/Nodes: No discrete thyroid nodularity. No pathologically enlarged mediastinal, hilar or axillary lymph nodes. Endotracheal tube with tip in the distal thoracic trachea. Enteric tube with tip in the stomach. Lungs/Pleura: Small bilateral pleural effusions. Left greater than right lower lobe airspace consolidation. Left upper lobe airspace opacity with multiple other small patchy areas of airspace opacities. No pneumothorax. Musculoskeletal: Diffuse idiopathic skeletal hyperostosis. No acute osseous abnormality. Review of the MIP  images confirms the above findings. CTA ABDOMEN AND PELVIS FINDINGS VASCULAR: The abdominal vasculature is poorly opacified secondary to patient's cardiac dysfunction, within this context. Aorta: Aortic atherosclerosis.  No abdominal aortic aneurysm. Celiac: Patent without evidence of aneurysm, dissection, vasculitis or significant stenosis. SMA: Patent without evidence of aneurysm, dissection, vasculitis or significant stenosis. Renals: Both renal arteries are patent without evidence of aneurysm, dissection, vasculitis, fibromuscular dysplasia or significant stenosis. IMA: Patent without evidence of aneurysm, dissection, vasculitis or significant stenosis. Inflow: Patent without evidence of aneurysm, dissection, vasculitis or significant stenosis. Veins: No obvious venous abnormality within the limitations  of this arterial phase study. Review of the MIP images confirms the above findings. NON-VASCULAR Hepatobiliary: No suspicious hepatic lesion. Reflux of contrast into the hepatic veins. Gallbladder is mildly distended without overt wall thickening, pericholecystic fluid or radiopaque calculi. No biliary ductal dilation. Pancreas: Unremarkable. No pancreatic ductal dilatation or surrounding inflammatory changes. Spleen: Within normal limits. Adrenals/Urinary Tract: Bilateral adrenal glands are unremarkable. No hydronephrosis. Right kidneys unremarkable. Hypodense 3.3 cm left lower pole renal lesion and exophytic 1.8 cm interpolar renal lesion, most consistent with a renal cyst. Urinary bladder is decompressed limiting evaluation. Stomach/Bowel: Enteric tube in the stomach. Otherwise stomach is grossly unremarkable. No suspicious small bowel wall thickening or dilation. Normal appendix. No suspicious colonic wall thickening or mass like lesions. Lymphatic: No gastrohepatic or hepatoduodenal ligament lymphadenopathy. No retroperitoneal or mesenteric lymphadenopathy. No pelvic sidewall lymphadenopathy. No groin lymphadenopathy. Reproductive: Prostate is unremarkable. Other: No abdominopelvic ascites. Musculoskeletal: Mild lumbar spondylosis. No acute osseous abnormality. Review of the MIP images confirms the above findings. IMPRESSION: 1. Hypodense material in the aortic arch on postcontrast imaging, beginning at the proximal aortic arch and extending into the proximal descending aorta without a discrete luminal flap visualized. Which is favored represent mixing artifact due to multiple contrast boluses and poor cardiac output. However, given that the proximal aspect of the defect is sharper and more distinct than would be expected for this type of artifact an early dissection flap is not completely excluded. If continued clinical concern for acute aortic syndrome consider further evaluation with cardiac ECHO. 2.  Unchanged size of the ascending aortic aneurysm measuring 5.4 cm. No intramural hematoma or abdominal aortic aneurysm. 3. Small bilateral pleural effusions with multifocal airspace consolidations, most consistent with multifocal pneumonia. 4. Cardiomegaly with reflux of contrast into the hepatic veins and IVC indicative of poor cardiac function. 5. Mildly distended gallbladder without overt wall thickening, pericholecystic fluid or radiopaque calculi. Recommend correlation with right upper quadrant and possible ultrasound. Electronically Signed   By: Dahlia Bailiff MD   On: 08/02/2020 10:05    Microbiology Recent Results (from the past 240 hour(s))  SARS CORONAVIRUS 2 (TAT 6-24 HRS) Nasopharyngeal Nasopharyngeal Swab     Status: None   Collection Time: 08/02/2020  3:16 AM   Specimen: Nasopharyngeal Swab  Result Value Ref Range Status   SARS Coronavirus 2 NEGATIVE NEGATIVE Final    Comment: (NOTE) SARS-CoV-2 target nucleic acids are NOT DETECTED.  The SARS-CoV-2 RNA is generally detectable in upper and lower respiratory specimens during the acute phase of infection. Negative results do not preclude SARS-CoV-2 infection, do not rule out co-infections with other pathogens, and should not be used as the sole basis for treatment or other patient management decisions. Negative results must be combined with clinical observations, patient history, and epidemiological information. The expected result is Negative.  Fact Sheet for Patients: SugarRoll.be  Fact Sheet for Healthcare Providers: https://www.woods-mathews.com/  This test  is not yet approved or cleared by the [place] and  has been authorized for detection and/or diagnosis of SARS-CoV-2 by FDA under an Emergency Use Authorization (EUA). This EUA will remain  in effect (meaning this test can be used) for the duration of the COVID-19 declaration under Se ction 564(b)(1) of the Act, 21  U.S.C. section 360bbb-3(b)(1), unless the authorization is terminated or revoked sooner.  Performed at [facility], [place] [address], [place], [place] [address]   MRSA PCR Screening     Status: None   Collection Time: Aug 14, 2020 12:17 PM   Specimen: Nasopharyngeal  Result Value Ref Range Status   MRSA by PCR NEGATIVE NEGATIVE Final    Comment:        The GeneXpert MRSA Assay (FDA approved for NASAL specimens only), is one component of a comprehensive MRSA colonization surveillance program. It is not intended to diagnose MRSA infection nor to guide or monitor treatment for MRSA infections. Performed at [facility], [place] [address] [place], [place] [address]   Culture, Respiratory w Gram Stain     Status: None   Collection Time: 08/04/20  7:59 AM   Specimen: Tracheal Aspirate; Respiratory  Result Value Ref Range Status   Specimen Description TRACHEAL ASPIRATE  Final   Special Requests NONE  Final   Gram Stain   Final    MODERATE WBC PRESENT,BOTH PMN AND MONONUCLEAR RARE GRAM POSITIVE COCCI RARE GRAM VARIABLE ROD    Culture   Final    ABUNDANT Normal respiratory flora-no Staph aureus or Pseudomonas seen Performed at [facility], [address], [place], [place] [address]    Report Status 08/06/2020 FINAL  Final  Culture, blood (routine x 2)     Status: None   Collection Time: 08/04/20  8:59 AM   Specimen: BLOOD LEFT HAND  Result Value Ref Range Status   Specimen Description BLOOD LEFT HAND  Final   Special Requests   Final    BOTTLES DRAWN AEROBIC ONLY Blood Culture results may not be optimal due to an inadequate volume of blood received in culture bottles   Culture   Final    NO GROWTH 5 DAYS Performed at [facility], [place] [address], [place], [place] [address]    Report Status 08/09/2020 FINAL  Final  Culture, blood (routine x 2)     Status: None   Collection Time: 08/04/20  9:06 AM   Specimen: BLOOD LEFT HAND  Result Value Ref Range Status    Specimen Description BLOOD LEFT HAND  Final   Special Requests   Final    BOTTLES DRAWN AEROBIC ONLY Blood Culture results may not be optimal due to an inadequate volume of blood received in culture bottles   Culture   Final    NO GROWTH 5 DAYS Performed at [facility], [place] [address] [place], [place] [address]    Report Status 08/09/2020 FINAL  Final    Lab Basic Metabolic Panel: Recent Labs  Lab 08-14-2020 0100 08/14/20 0232 08/14/20 0647 08/14/2020 1122 Aug 14, 2020 1217 08/14/20 2202 08/04/20 0429 08/05/20 0435  NA 138  --    < > 132* 134* 136 138 139  K 3.1*  --    < > 4.7 3.8 3.6 3.3* 3.6  CL 102  --   --   --  103 103 104 106  CO2 26  --   --   --  17* 18* 20* 21*  GLUCOSE 134*  --   --   --  150* 104* 89 92  BUN 49*  --   --   --  53* 59* 58* 51*  CREATININE 2.23*  --   --   --  2.85* 3.05* 3.11* 2.93*  CALCIUM 8.8*  --   --   --  8.4* 8.5* 8.7* 8.4*  MG  --  2.3  --   --   --   --   --  1.9   < > = values in this interval not displayed.   Liver Function Tests: Recent Labs  Lab 07/29/2020 0100 07/20/2020 1217 07/27/2020 2202 08/04/20 0429 08/05/20 0435  AST 27 1,078* 2,222* 1,859* 578*  ALT 33 864* 1,217* 1,234* 1,050*  ALKPHOS 40 37* 37* 40 41  BILITOT 1.9* 2.4* 1.8* 1.7* 2.4*  PROT 6.5 6.3* 6.0* 6.1* 5.7*  ALBUMIN 2.8* 2.7* 2.6* 2.6* 2.4*   No results for input(s): LIPASE, AMYLASE in the last 168 hours. No results for input(s): AMMONIA in the last 168 hours. CBC: Recent Labs  Lab 08/01/2020 0100 07/23/2020 0647 07/23/2020 0653 08/09/2020 1122 07/20/2020 1217 07/20/2020 2202 08/04/20 0429 08/05/20 0435  WBC 11.1*  --  8.8  --  9.4 11.5* 9.7 9.4  NEUTROABS 9.0*  --   --   --   --   --   --   --   HGB 13.6   < > 11.6* 12.9* 13.4 13.1 13.2 12.7*  HCT 37.9*   < > 33.3* 38.0* 37.1* 35.6* 36.4* 35.4*  MCV 91.8  --  94.1  --  90.7 89.7 89.9 91.5  PLT 238  --  211  --  239 233 221 205   < > = values in this interval not displayed.   Cardiac Enzymes: No results  for input(s): CKTOTAL, CKMB, CKMBINDEX, TROPONINI in the last 168 hours. Sepsis Labs: Recent Labs  Lab 07/14/2020 0657 08/07/2020 1217 07/29/2020 2202 08/04/20 0429 08/04/20 1042 08/04/20 1200 08/05/20 0435  PROCALCITON  --   --   --   --   --   --  1.71  WBC  --  9.4 11.5* 9.7  --   --  9.4  LATICACIDVEN 8.6* 3.1*  --   --  1.1 1.3  --    Lenice Llamas, MD Pulmonary [facility] [place]

## 2020-08-13 DEATH — deceased

## 2020-08-15 ENCOUNTER — Other Ambulatory Visit (HOSPITAL_COMMUNITY): Payer: Medicare HMO

## 2020-08-17 ENCOUNTER — Ambulatory Visit: Payer: Medicare HMO | Admitting: Cardiothoracic Surgery

## 2020-08-24 ENCOUNTER — Ambulatory Visit: Payer: Medicare HMO | Admitting: Cardiothoracic Surgery

## 2020-09-22 ENCOUNTER — Ambulatory Visit: Payer: Medicare HMO | Admitting: Cardiology

## 2020-10-23 ENCOUNTER — Encounter: Payer: Self-pay | Admitting: Cardiology

## 2021-08-27 IMAGING — DX DG CHEST 1V PORT
1 series · 1 of 1 positions shown · non-contrast
Comparison: Portable exam 4194 hours compared to 08/03/2020

CLINICAL DATA: Intubation, CHF, MI, hypertension

EXAM:
PORTABLE CHEST 1 VIEW

[chest]
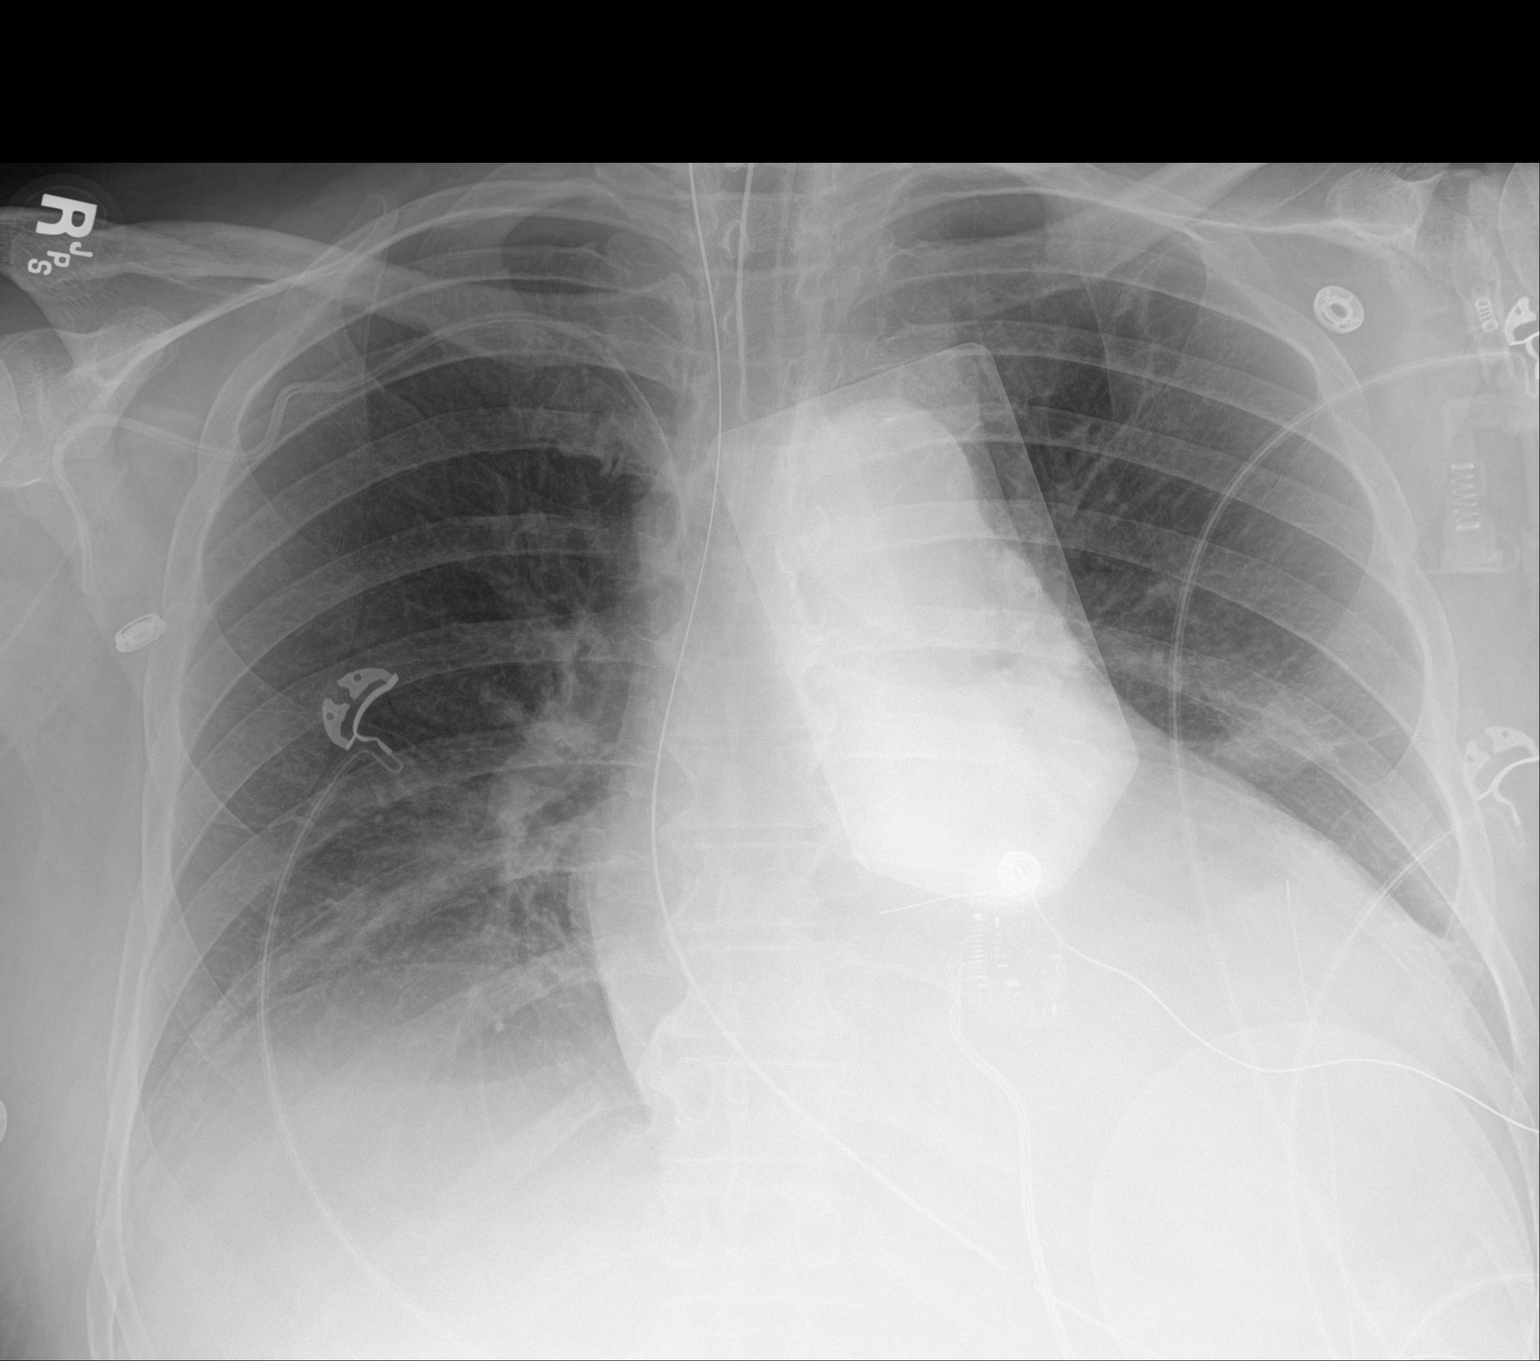

[1 of 1 positions shown; findings below may reference images not displayed]

FINDINGS: Tip of endotracheal tube projects 3.6 cm above carina.

Nasogastric tube extends into stomach.

RIGHT subclavian line with tip projecting over SVC.

External pacing leads present.

Enlargement of cardiac silhouette.

Mediastinal contours and pulmonary vascularity normal.

Bibasilar atelectasis.

No infiltrate, pleural effusion or pneumothorax.
IMPRESSION: Enlargement of cardiac silhouette with bibasilar atelectasis.

## 2021-08-27 IMAGING — CT CT HEAD W/O CM
4 series · 16 of 47 positions shown, 18 images · non-contrast
Comparison: CT head 08/03/2020

CLINICAL DATA: Anoxic brain damage.

EXAM:
CT HEAD WITHOUT CONTRAST
TECHNIQUE: Contiguous axial images were obtained from the base of the skull
through the vertex without intravenous contrast.

[Series 3: head without · axial · non-contrast · 0.46mm/px · z∈[-158,-33]mm · 6 of 35 slices shown, 8 images]
[im 5/35  brain]
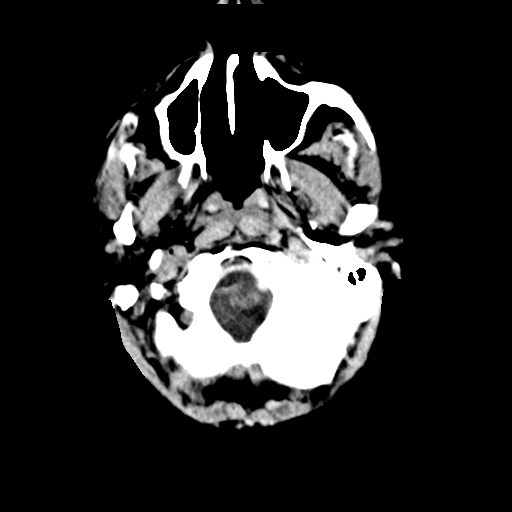
[im 5/35  bone]
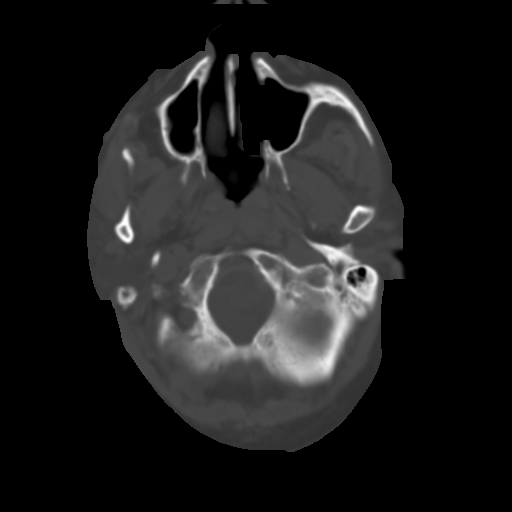
[im 10/35  brain]
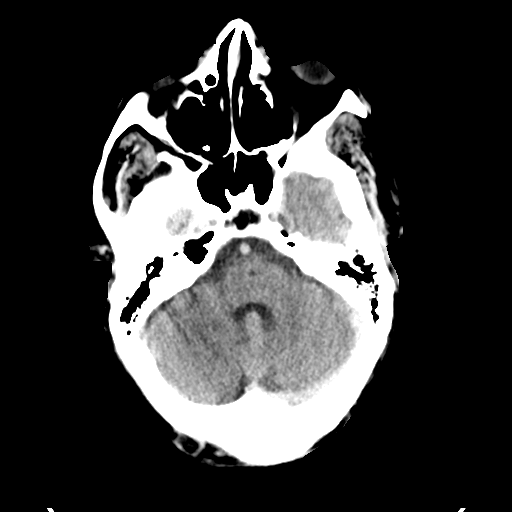
[im 15/35  brain]
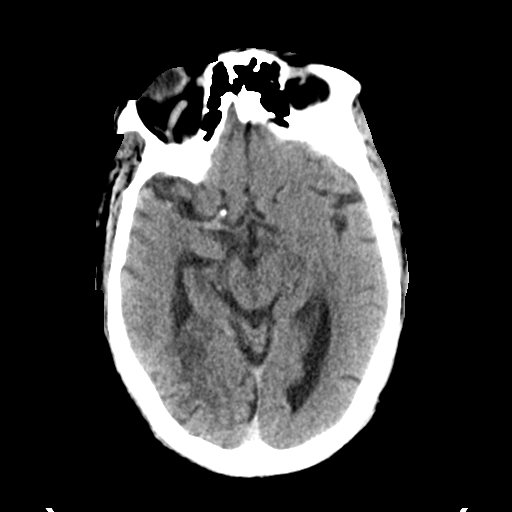
[im 20/35  brain]
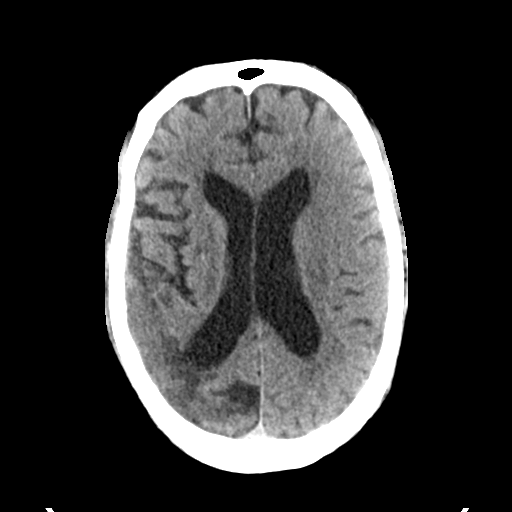
[im 25/35  brain]
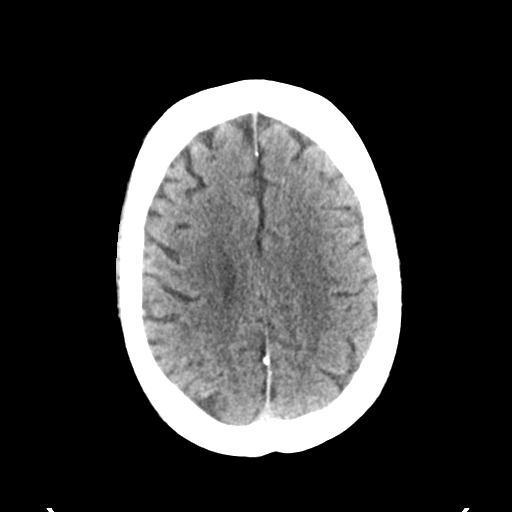
[im 25/35  bone]
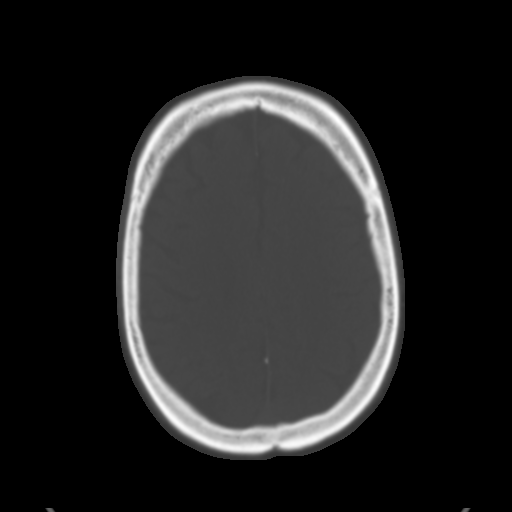
[im 30/35  brain]
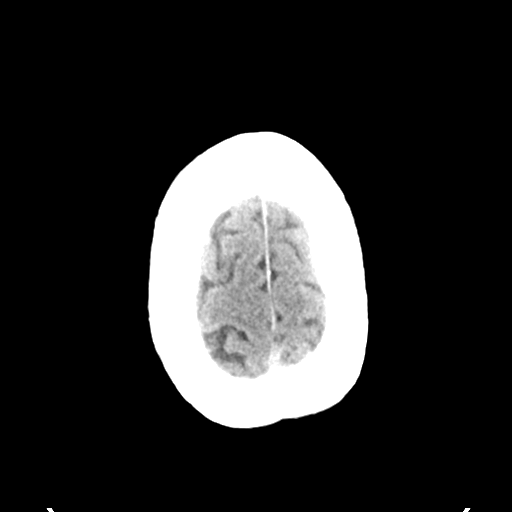

[Series 4: head bone · axial · 0.46mm/px · z∈[-162,-104]mm · 4 of 89 slices shown]
[im 9/89  bone]
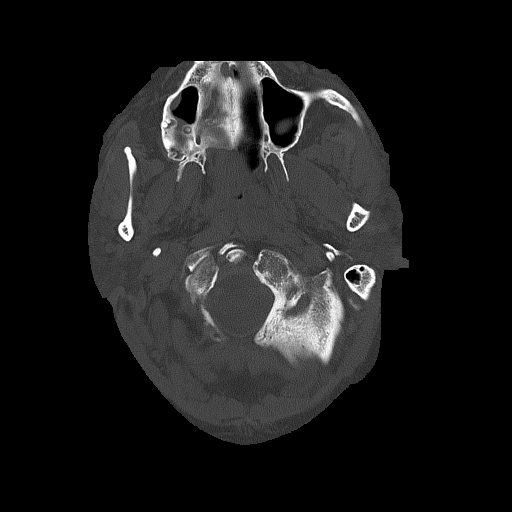
[im 17/89  bone]
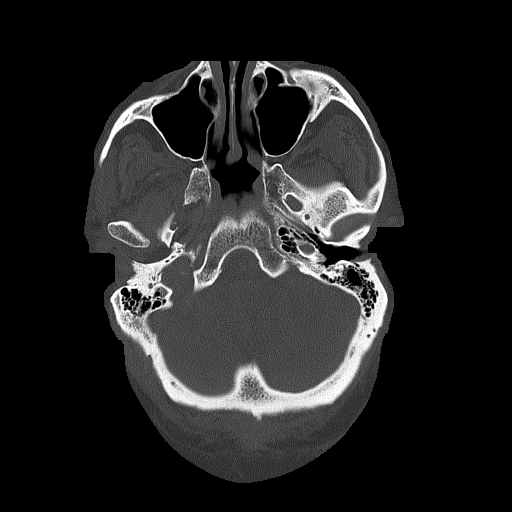
[im 30/89  bone]
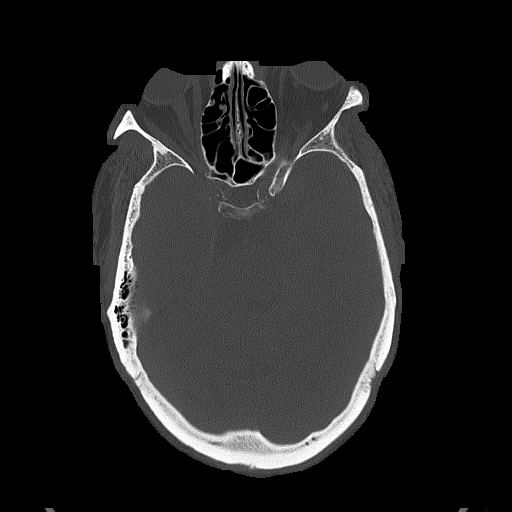
[im 38/89  bone]
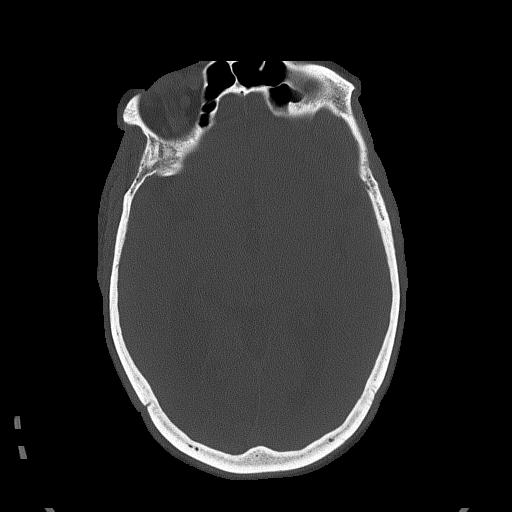

[Series 5: head without cor · coronal · non-contrast · 0.32mm/px · 3 of 78 slices shown]
[im 35/78  brain]
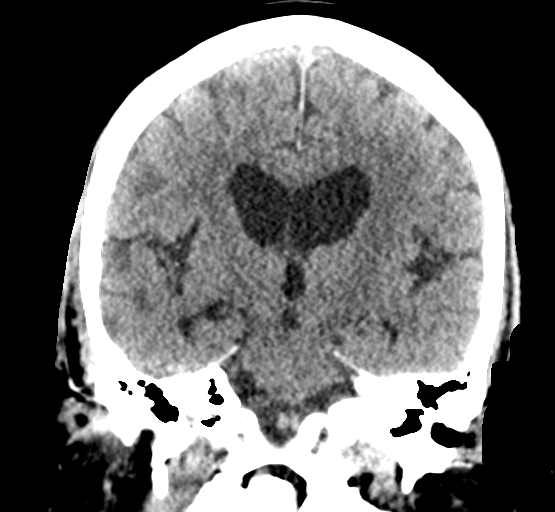
[im 43/78  brain]
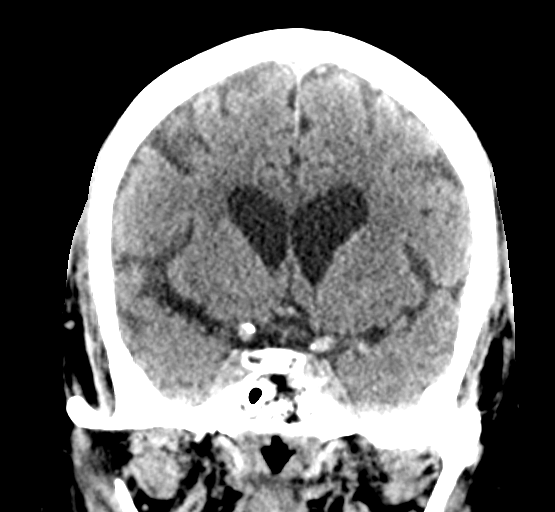
[im 52/78  brain]
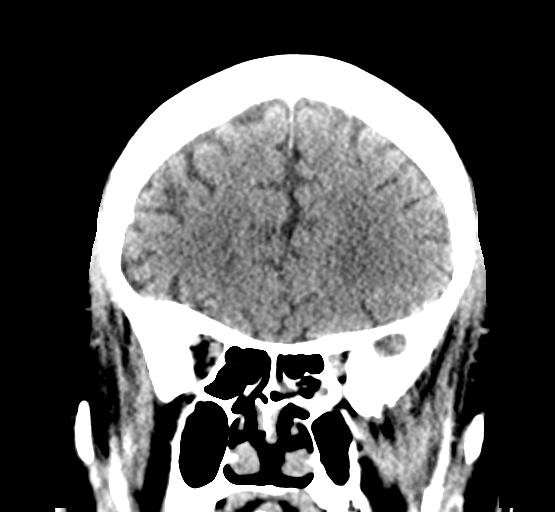

[Series 6: head without sag · sagittal · non-contrast · 0.35mm/px · 3 of 61 slices shown]
[im 21/61  brain]
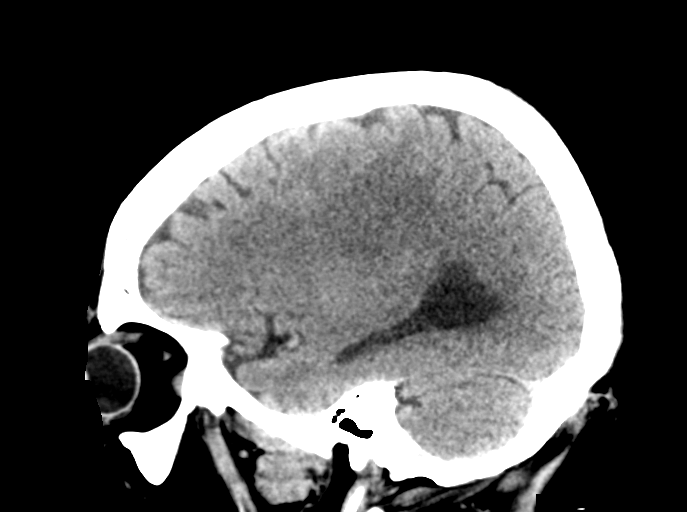
[im 31/61  brain]
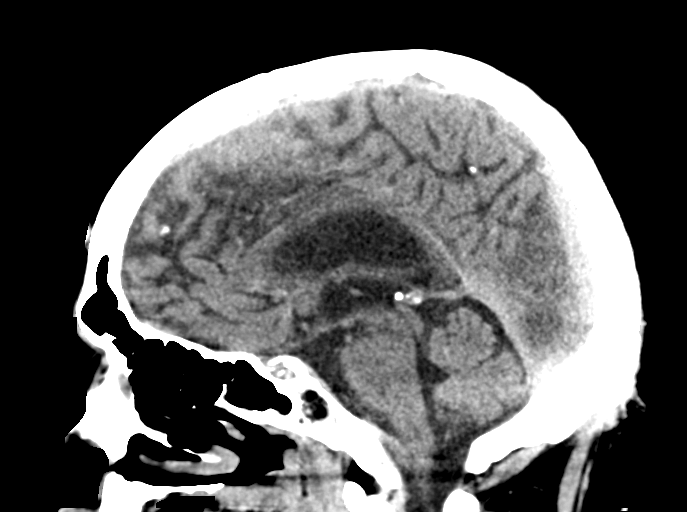
[im 41/61  brain]
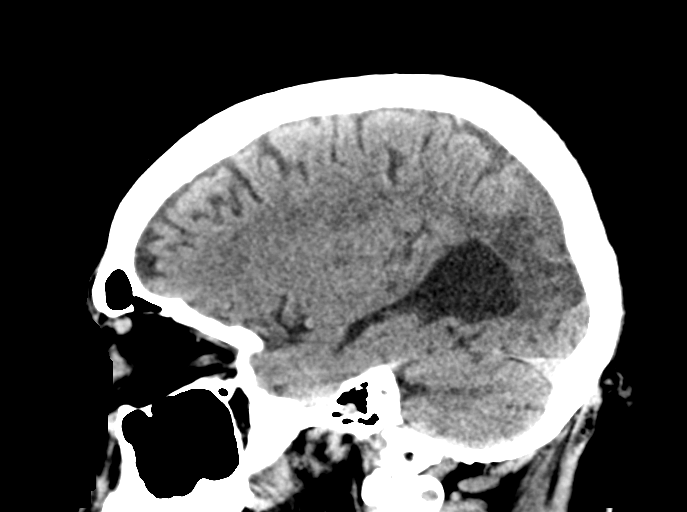

[16 of 47 positions shown; findings below may reference images not displayed]

FINDINGS: Brain: Generalized atrophy and mild ventricular enlargement
unchanged. No effacement of the sulci no brain edema identified.

Chronic cortically based hypodensity in the right occipital parietal
lobe unchanged consistent with chronic infarction. Chronic ischemia
in the deep white matter on the right.

Negative for acute infarct, hemorrhage, mass.

Vascular: Negative for hyperdense vessel. Atherosclerotic
calcification in the carotid artery bilaterally.

Skull: Negative

Sinuses/Orbits: Mild mucosal edema paranasal sinuses. No orbital
lesion.

Other: None
IMPRESSION: No acute abnormality no change from yesterday. No evidence of brain
edema, acute infarct, hemorrhage.
